# Patient Record
Sex: Female | Born: 1940 | Race: White | Hispanic: No | Marital: Married | Smoking: Former smoker
Health system: Southern US, Community
[De-identification: ages and names within clinical notes are randomized; demographics above are authoritative.]

## PROBLEM LIST (undated history)

## (undated) DIAGNOSIS — N1330 Unspecified hydronephrosis: Secondary | ICD-10-CM

## (undated) DIAGNOSIS — E785 Hyperlipidemia, unspecified: Secondary | ICD-10-CM

## (undated) DIAGNOSIS — G43909 Migraine, unspecified, not intractable, without status migrainosus: Secondary | ICD-10-CM

## (undated) DIAGNOSIS — R51 Headache: Secondary | ICD-10-CM

## (undated) DIAGNOSIS — N393 Stress incontinence (female) (male): Secondary | ICD-10-CM

## (undated) DIAGNOSIS — Z9889 Other specified postprocedural states: Secondary | ICD-10-CM

## (undated) DIAGNOSIS — R112 Nausea with vomiting, unspecified: Secondary | ICD-10-CM

## (undated) DIAGNOSIS — Z9289 Personal history of other medical treatment: Secondary | ICD-10-CM

## (undated) DIAGNOSIS — C50919 Malignant neoplasm of unspecified site of unspecified female breast: Secondary | ICD-10-CM

## (undated) DIAGNOSIS — N362 Urethral caruncle: Secondary | ICD-10-CM

## (undated) DIAGNOSIS — Z79811 Long term (current) use of aromatase inhibitors: Secondary | ICD-10-CM

## (undated) DIAGNOSIS — C801 Malignant (primary) neoplasm, unspecified: Secondary | ICD-10-CM

## (undated) DIAGNOSIS — E538 Deficiency of other specified B group vitamins: Secondary | ICD-10-CM

## (undated) DIAGNOSIS — R519 Headache, unspecified: Secondary | ICD-10-CM

## (undated) HISTORY — DX: Urethral caruncle: N36.2

## (undated) HISTORY — DX: Headache, unspecified: R51.9

## (undated) HISTORY — DX: Hyperlipidemia, unspecified: E78.5

## (undated) HISTORY — PX: BILATERAL SALPINGOOPHORECTOMY: SHX1223

## (undated) HISTORY — DX: Stress incontinence (female) (male): N39.3

## (undated) HISTORY — DX: Migraine, unspecified, not intractable, without status migrainosus: G43.909

## (undated) HISTORY — DX: Malignant (primary) neoplasm, unspecified: C80.1

## (undated) HISTORY — DX: Malignant neoplasm of unspecified site of unspecified female breast: C50.919

## (undated) HISTORY — PX: OTHER SURGICAL HISTORY: SHX169

## (undated) HISTORY — DX: Headache: R51

## (undated) HISTORY — DX: Long term (current) use of aromatase inhibitors: Z79.811

## (undated) HISTORY — DX: Other specified postprocedural states: Z98.890

---

## 1975-08-31 HISTORY — PX: TUBAL LIGATION: SHX77

## 1986-08-30 HISTORY — PX: OTHER SURGICAL HISTORY: SHX169

## 1991-08-31 HISTORY — PX: CHOLECYSTECTOMY: SHX55

## 1995-08-31 HISTORY — PX: ABDOMINAL HYSTERECTOMY: SHX81

## 2001-02-06 ENCOUNTER — Ambulatory Visit (HOSPITAL_COMMUNITY): Admission: RE | Admit: 2001-02-06 | Discharge: 2001-02-06 | Payer: Self-pay | Admitting: Pulmonary Disease

## 2002-02-16 ENCOUNTER — Ambulatory Visit (HOSPITAL_COMMUNITY): Admission: RE | Admit: 2002-02-16 | Discharge: 2002-02-16 | Payer: Self-pay | Admitting: Pulmonary Disease

## 2002-02-22 ENCOUNTER — Ambulatory Visit (HOSPITAL_COMMUNITY): Admission: RE | Admit: 2002-02-22 | Discharge: 2002-02-22 | Payer: Self-pay | Admitting: Gastroenterology

## 2002-02-22 ENCOUNTER — Encounter (INDEPENDENT_AMBULATORY_CARE_PROVIDER_SITE_OTHER): Payer: Self-pay | Admitting: Specialist

## 2004-02-20 ENCOUNTER — Ambulatory Visit (HOSPITAL_COMMUNITY): Admission: RE | Admit: 2004-02-20 | Discharge: 2004-02-20 | Payer: Self-pay | Admitting: Pulmonary Disease

## 2004-02-24 ENCOUNTER — Ambulatory Visit (HOSPITAL_COMMUNITY): Admission: RE | Admit: 2004-02-24 | Discharge: 2004-02-24 | Payer: Self-pay | Admitting: Pulmonary Disease

## 2005-02-25 ENCOUNTER — Ambulatory Visit (HOSPITAL_COMMUNITY): Admission: RE | Admit: 2005-02-25 | Discharge: 2005-02-25 | Payer: Self-pay | Admitting: Pulmonary Disease

## 2005-07-14 ENCOUNTER — Ambulatory Visit (HOSPITAL_COMMUNITY): Admission: RE | Admit: 2005-07-14 | Discharge: 2005-07-14 | Payer: Self-pay | Admitting: Pulmonary Disease

## 2005-07-26 ENCOUNTER — Ambulatory Visit: Payer: Self-pay | Admitting: Orthopedic Surgery

## 2006-09-30 ENCOUNTER — Encounter (HOSPITAL_COMMUNITY): Admission: RE | Admit: 2006-09-30 | Discharge: 2006-10-30 | Payer: Self-pay | Admitting: Specialist

## 2006-10-31 ENCOUNTER — Encounter (HOSPITAL_COMMUNITY): Admission: RE | Admit: 2006-10-31 | Discharge: 2006-11-30 | Payer: Self-pay | Admitting: Specialist

## 2006-12-01 ENCOUNTER — Ambulatory Visit: Admission: RE | Admit: 2006-12-01 | Discharge: 2006-12-01 | Payer: Self-pay | Admitting: Specialist

## 2008-03-12 ENCOUNTER — Ambulatory Visit (HOSPITAL_COMMUNITY): Admission: RE | Admit: 2008-03-12 | Discharge: 2008-03-12 | Payer: Self-pay | Admitting: Pulmonary Disease

## 2008-03-25 ENCOUNTER — Ambulatory Visit (HOSPITAL_COMMUNITY): Admission: RE | Admit: 2008-03-25 | Discharge: 2008-03-25 | Payer: Self-pay | Admitting: Pulmonary Disease

## 2009-02-28 ENCOUNTER — Ambulatory Visit (HOSPITAL_COMMUNITY): Admission: RE | Admit: 2009-02-28 | Discharge: 2009-02-28 | Payer: Self-pay | Admitting: Urology

## 2009-03-25 ENCOUNTER — Ambulatory Visit (HOSPITAL_COMMUNITY): Admission: RE | Admit: 2009-03-25 | Discharge: 2009-03-25 | Payer: Self-pay | Admitting: Urology

## 2009-04-15 ENCOUNTER — Observation Stay (HOSPITAL_COMMUNITY): Admission: RE | Admit: 2009-04-15 | Discharge: 2009-04-16 | Payer: Self-pay | Admitting: Urology

## 2009-04-15 ENCOUNTER — Encounter (INDEPENDENT_AMBULATORY_CARE_PROVIDER_SITE_OTHER): Payer: Self-pay | Admitting: Urology

## 2009-08-30 HISTORY — PX: BLADDER SURGERY: SHX569

## 2009-10-16 ENCOUNTER — Ambulatory Visit (HOSPITAL_COMMUNITY): Admission: RE | Admit: 2009-10-16 | Discharge: 2009-10-16 | Payer: Self-pay | Admitting: Urology

## 2010-11-18 LAB — CBC
Hemoglobin: 13.4 g/dL (ref 12.0–15.0)
RBC: 4.19 MIL/uL (ref 3.87–5.11)
RDW: 13.3 % (ref 11.5–15.5)

## 2010-11-18 LAB — BASIC METABOLIC PANEL
Calcium: 9.7 mg/dL (ref 8.4–10.5)
GFR calc Af Amer: >60 mL/min (ref >60)
GFR calc non Af Amer: >60 mL/min (ref >60)
Sodium: 139 mEq/L (ref 135–145)

## 2010-12-05 LAB — CBC
HCT: 33.5 % — ABNORMAL LOW (ref 36.0–46.0)
HCT: 38.9 % (ref 36.0–46.0)
Hemoglobin: 11.6 g/dL — ABNORMAL LOW (ref 12.0–15.0)
MCHC: 34.7 g/dL (ref 30.0–36.0)
MCV: 93.9 fL (ref 78.0–100.0)
Platelets: 287 10*3/uL (ref 150–400)
RBC: 3.53 MIL/uL — ABNORMAL LOW (ref 3.87–5.11)
RDW: 12.9 % (ref 11.5–15.5)
RDW: 13.2 % (ref 11.5–15.5)

## 2010-12-05 LAB — DIFFERENTIAL
Basophils Absolute: 0 10*3/uL (ref 0.0–0.1)
Eosinophils Relative: 0 % (ref 0–5)
Lymphocytes Relative: 21 % (ref 12–46)
Lymphs Abs: 2.5 10*3/uL (ref 0.7–4.0)
Monocytes Absolute: 1 10*3/uL (ref 0.1–1.0)
Monocytes Relative: 9 % (ref 3–12)

## 2010-12-05 LAB — BASIC METABOLIC PANEL
BUN: 10 mg/dL (ref 6–23)
CO2: 27 mEq/L (ref 19–32)
CO2: 27 mEq/L (ref 19–32)
Chloride: 103 mEq/L (ref 96–112)
Creatinine, Ser: 0.91 mg/dL (ref 0.4–1.2)
GFR calc Af Amer: >60 mL/min (ref >60)
GFR calc non Af Amer: >60 mL/min (ref >60)
Glucose, Bld: 106 mg/dL — ABNORMAL HIGH (ref 70–99)
Glucose, Bld: 117 mg/dL — ABNORMAL HIGH (ref 70–99)
Potassium: 4.1 mEq/L (ref 3.5–5.1)
Sodium: 140 mEq/L (ref 135–145)

## 2011-01-12 NOTE — Op Note (Signed)
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   ANESTHESIA:  General endotracheal.   PROCEDURE:  Cystoscopy, retrograde urethrogram, urethral  diverticulectomy, removal of the stone, and repair of the urethrovaginal  fistula.   SURGEON:  Ky Barban, MD   PROCEDURE:  The patient under general endotracheal anesthesia in  lithotomy position.  After usual prep and drape, #18 Foley catheter  inserted into the bladder.  Then, Hypaque solution was injected  alongside this catheter into the urethra when I was pulling the balloon  against the bladder neck trying to fill the urethra up.  It was done  under fluoroscopic control.  I can see the dye goes around the stone, so  at this point I decided to do a cystoscopy.  A #25 cystoscope introduced  into the bladder and the urethra was inspected.  I can see the opening  of the urethral diverticulum tiny opening which is located around 5  o'clock position just slightly distal to the bladder neck, and while I  was looking through the cystoscope and I have #5 whistle-tip catheter  into the diverticulum and I injected the methylene blue solution into  it, inspected the vaginal mucosa, then I can see a tiny opening where  the stone is and this opening.  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  The incision was made in the  vaginal mucosa all layers.  I was able to develop a plane between the  vaginal mucosa and the urethral diverticulum when the stone was still in  place but I cannot get around it.  So I decided to open up the  diverticulum.  Diverticulum was opened, and the stone was taken out.  Then from this site, I tried to locate the fistulous opening between the  urethra and the bladder which I was able to locate, and I put the  whistle-tip ureteral catheter through it.  The diverticulum  was  dissected out of the periurethral tissue and the perivaginal area.  Then, I was able to put a small #8 Foley catheter through the fistulous  opening into the bladder.  Balloon was inflated.  The ureteral catheter  was removed, and with traction on the Foley balloon I can see the  opening of the diverticulum very clearly.  The diverticulum was excised  around this opening and separated from rest of the perivaginal and  periurethral tissue and the diverticular sac was thus removed  completely.  Then, under direct vision the fistulous opening was closed  with interrupted sutures of 3-0 Vicryl.  Then, I closed the remaining  periurethral and perivaginal tissue on top of this in at least 3 layers  covering this fistulous opening.  The Foley catheter which was placed  through this fistulous opening I removed it and tied the stitches.  I  injected methylene blue solution into the urethra to see  if there was  any leakage from the fistulous tract.  I do not see any methylene blue  coming out into the fistulous track area.  After closing the  periurethral tissue at least in 3 layers, the vaginal mucosa was closed  with 3-0 Vicryl interrupted sutures.  Again, a #20 Foley catheter was  left in for drainage, and vagina was packed with Iodoform gauze.  The  patient left the operating room in satisfactory condition.  I did  inspect the urethra with the cystoscope at the end.  Both orifices were  draining clear urine.  The urethra also looks patent.  Of course, there  was some irregular tissue in the proximal urethra from the repair.  All  the instruments were removed.  The patient left the operating room in  satisfactory condition.      Ky Barban, M.D.  Electronically Signed     MIJ/MEDQ  D:  04/15/2009  T:  04/16/2009  Job:  [id number]

## 2011-01-12 NOTE — Consult Note (Signed)
NAME:  Shelly Willis, Shelly Willis           ACCOUNT NO.:  [id number]   MEDICAL RECORD NO.:  [id number]           PATIENT TYPE:  AMB   LOCATION:  DAY                           FACILITY:  [facility]   PHYSICIAN:  Ky Barban, M.D.DATE OF BIRTH:  12-14-1940   DATE OF CONSULTATION:  DATE OF DISCHARGE:                                 CONSULTATION   CHIEF COMPLAINT:  Urethral diverticulum with a large 3 cm stone.   HISTORY:  A 70 year old female first she was seen by Dr. Rito Ehrlich July  8, who diagnosed her clinically with CT scan and physical exam that she  has a urethral diverticulum with a large 3 cm stone, urinary tract  infections, and incontinence mixed type, so she was referred to me for  further management.  I got MRI of the pelvis because there was some  question on this CT, this was a urethral diverticulum or some other  pathology, but the radiologist on MRI thinks it was a urethral  diverticulum with the stone.  I cystoscoped that in the office.  I could  not see any opening of the diverticulum in the urethra.  It was very  uncomfortable, so I quit the procedure, so I have suggested that we do a  retrograde urethrogram cystoscopy, but it is a urethral diverticulum.  I  need to go ahead and remove the stone and repair the urethral  diverticulum.  I excised the diverticulum and closed the opening between  the urethra and the diverticulum.  I went over the procedure several  time with the patient and her husband.  I told them that there is always  possibility of developing a leak from this area.  The patient can  develop urethrovaginal fistula which mean that the urine can  continuously come from that whole and she will require additional  surgery.  The second thing is as per the incontinence is concerned once  we remove the stone, I will have to reassess it.  I do not think, I want  to do any anti-incontinence procedure at this time combined it with this  procedure and they understand, want  me to go ahead and proceed, but she  is coming as outpatient, so we can go ahead and do a urethral  diverticulum with urethral diverticulectomy, retrograde urethrogram, and  cystoscopy, and removal of the stone under anesthesia, keep her there  overnight for observation.   PAST MEDICAL HISTORY:  Essentially negative.  No history of diabetes or  hypertension.   PERSONAL HISTORY:  Does not smoke or drink.   REVIEW OF SYSTEMS:  Unremarkable.   PHYSICAL EXAMINATION:  GENERAL:  Well-nourished, well-developed female,  not in acute distress.  VITAL SIGNS:  Blood pressure 120/80, temperature is normal.  CENTRAL NERVOUS SYSTEM:  Negative.  HEAD, NECK, EYES, AND ENT:  Negative.  CHEST:  Symmetrical.  HEART:  Regular sinus rhythm and no murmur.  ABDOMEN:  Soft, flat.  Liver, spleen, and kidneys are not palpable.  No  CVA tenderness.  PELVIC:  There is a walnut size, solid, hard stone palpated in the  suburethral area in the anterior  vaginal wall near the bladder neck.   IMPRESSION:  1. Urethral diverticulum with stone.  2. Urinary incontinence, mixed type.   PLAN:  Urethral diverticulectomy, removal of stone, retrograde  urethrogram, cystoscopy under anesthesia.   NOTE:  The patient had a urine culture done on July 28.  She is being  treated with Cipro and she is growing E. coli in the urine, so what she  may need prophylactic antibiotic, it is sensitive to most of the  antibiotics.      Ky Barban, M.D.  Electronically Signed     MIJ/MEDQ  D:  04/14/2009  T:  04/15/2009  Job:  [id number]

## 2011-04-13 ENCOUNTER — Other Ambulatory Visit (HOSPITAL_COMMUNITY): Payer: Self-pay | Admitting: Pulmonary Disease

## 2011-04-13 DIAGNOSIS — Z139 Encounter for screening, unspecified: Secondary | ICD-10-CM

## 2011-04-19 ENCOUNTER — Ambulatory Visit (HOSPITAL_COMMUNITY)
Admission: RE | Admit: 2011-04-19 | Discharge: 2011-04-19 | Disposition: A | Discharge status: Home / Self Care | Payer: Medicare Other | Source: Ambulatory Visit | Attending: Pulmonary Disease | Admitting: Pulmonary Disease

## 2011-04-19 DIAGNOSIS — Z1231 Encounter for screening mammogram for malignant neoplasm of breast: Secondary | ICD-10-CM | POA: Insufficient documentation

## 2011-04-19 DIAGNOSIS — Z139 Encounter for screening, unspecified: Secondary | ICD-10-CM

## 2011-04-22 ENCOUNTER — Other Ambulatory Visit: Payer: Self-pay | Admitting: Pulmonary Disease

## 2011-04-22 DIAGNOSIS — R928 Other abnormal and inconclusive findings on diagnostic imaging of breast: Secondary | ICD-10-CM

## 2011-05-26 ENCOUNTER — Ambulatory Visit (HOSPITAL_COMMUNITY): Payer: Medicare Other

## 2011-06-02 ENCOUNTER — Ambulatory Visit (HOSPITAL_COMMUNITY)
Admission: RE | Admit: 2011-06-02 | Discharge: 2011-06-02 | Disposition: A | Discharge status: Home / Self Care | Payer: Medicare Other | Source: Ambulatory Visit | Attending: Pulmonary Disease | Admitting: Pulmonary Disease

## 2011-06-02 ENCOUNTER — Other Ambulatory Visit (HOSPITAL_COMMUNITY): Payer: Self-pay | Admitting: Pulmonary Disease

## 2011-06-02 DIAGNOSIS — R928 Other abnormal and inconclusive findings on diagnostic imaging of breast: Secondary | ICD-10-CM

## 2011-06-02 DIAGNOSIS — N6310 Unspecified lump in the right breast, unspecified quadrant: Secondary | ICD-10-CM

## 2011-06-02 DIAGNOSIS — N63 Unspecified lump in unspecified breast: Secondary | ICD-10-CM | POA: Insufficient documentation

## 2011-06-09 ENCOUNTER — Inpatient Hospital Stay (HOSPITAL_COMMUNITY): Admission: RE | Admit: 2011-06-09 | Payer: Medicare Other | Source: Ambulatory Visit

## 2011-06-09 ENCOUNTER — Ambulatory Visit (HOSPITAL_COMMUNITY): Admission: RE | Admit: 2011-06-09 | Payer: Medicare Other | Source: Ambulatory Visit

## 2011-06-16 ENCOUNTER — Other Ambulatory Visit (HOSPITAL_COMMUNITY): Payer: Self-pay | Admitting: Pulmonary Disease

## 2011-06-16 ENCOUNTER — Ambulatory Visit (HOSPITAL_COMMUNITY)
Admission: RE | Admit: 2011-06-16 | Discharge: 2011-06-16 | Disposition: A | Discharge status: Home / Self Care | Payer: Medicare Other | Source: Ambulatory Visit | Attending: Pulmonary Disease | Admitting: Pulmonary Disease

## 2011-06-16 ENCOUNTER — Inpatient Hospital Stay (HOSPITAL_COMMUNITY): Admission: RE | Admit: 2011-06-16 | Discharge: 2011-06-16 | Payer: Medicare Other | Source: Ambulatory Visit

## 2011-06-16 DIAGNOSIS — C50919 Malignant neoplasm of unspecified site of unspecified female breast: Secondary | ICD-10-CM

## 2011-06-16 DIAGNOSIS — N6310 Unspecified lump in the right breast, unspecified quadrant: Secondary | ICD-10-CM

## 2011-06-16 HISTORY — PX: OTHER SURGICAL HISTORY: SHX169

## 2011-06-16 HISTORY — DX: Malignant neoplasm of unspecified site of unspecified female breast: C50.919

## 2011-06-16 NOTE — Progress Notes (Signed)
9604 Procedure started for Right Breast Biopsy.  Area cleaned with Chlora prep and injected with 6 cc of Lidocaine 2%.  Three passes obtained from right breast.  Pt tolerated procedure well.  Procedure end time 1000.

## 2011-06-17 ENCOUNTER — Other Ambulatory Visit: Payer: Self-pay | Admitting: Pulmonary Disease

## 2011-06-17 DIAGNOSIS — C50911 Malignant neoplasm of unspecified site of right female breast: Secondary | ICD-10-CM

## 2011-06-21 ENCOUNTER — Ambulatory Visit (HOSPITAL_COMMUNITY)
Admission: RE | Admit: 2011-06-21 | Discharge: 2011-06-21 | Disposition: A | Discharge status: Home / Self Care | Payer: Medicare Other | Source: Ambulatory Visit | Attending: Pulmonary Disease | Admitting: Pulmonary Disease

## 2011-06-21 DIAGNOSIS — C50919 Malignant neoplasm of unspecified site of unspecified female breast: Secondary | ICD-10-CM | POA: Insufficient documentation

## 2011-06-21 DIAGNOSIS — C50911 Malignant neoplasm of unspecified site of right female breast: Secondary | ICD-10-CM

## 2011-06-21 MED ORDER — GADOBENATE DIMEGLUMINE 529 MG/ML IV SOLN
12.0000 mL | Freq: Once | INTRAVENOUS | Status: AC | PRN
  Administered 2011-06-21: 12 mL via INTRAVENOUS

## 2011-06-23 ENCOUNTER — Encounter: Payer: Medicare Other | Admitting: Oncology

## 2011-06-23 ENCOUNTER — Ambulatory Visit: Payer: Medicare Other | Admitting: Physical Therapy

## 2011-06-24 ENCOUNTER — Other Ambulatory Visit (INDEPENDENT_AMBULATORY_CARE_PROVIDER_SITE_OTHER): Payer: Self-pay | Admitting: General Surgery

## 2011-06-24 ENCOUNTER — Encounter (INDEPENDENT_AMBULATORY_CARE_PROVIDER_SITE_OTHER): Payer: Self-pay | Admitting: Surgery

## 2011-06-24 ENCOUNTER — Ambulatory Visit (INDEPENDENT_AMBULATORY_CARE_PROVIDER_SITE_OTHER): Payer: Medicare Other | Admitting: Surgery

## 2011-06-24 VITALS — BP 158/86 | HR 60 | Temp 96.8°F | Resp 20 | Ht 64.0 in | Wt 143.5 lb

## 2011-06-24 DIAGNOSIS — C50919 Malignant neoplasm of unspecified site of unspecified female breast: Secondary | ICD-10-CM

## 2011-06-24 DIAGNOSIS — C50911 Malignant neoplasm of unspecified site of right female breast: Secondary | ICD-10-CM

## 2011-06-24 NOTE — Progress Notes (Signed)
Shelly Willis is a 70 year old lady from [place] who is a close friend of my breast cancer patient, Shelly Willis who came to see me regarding her new diagnosis of invasive breast cancer with lobular features.  This is HER2 negative, ER + at 99%, PR+ 23%, Ki67 32%.  On MRI this is upper right breast and is 2.4 cm in greatest dimension.  The MR report mentions dermal thickening and nipple retraction but she has a moderate hematoma present and I think that this is post biopsy tissue edema.    Physical exam reveals a biopsy site on the right about 9:00 with surrounding  Ecchymosis. There is a subtle palpable mass deep in the breast beneath the nipple. There is edema in the area of the bruise I think that this is what we are seeing on the MR scan. There is no nipple discharge on either side. I feel no axillary or supraclavicular adenopathy on either side.  She reported that prior to her mammogram, she had no mass or breast changes that she had noticed.    Based on the size of her breast I think that lumpectomy with adequate margins would destroy the architecture. I think she may be a candidate for neoadjuvant chemotherapy and we were for her to medical oncology to consider this. I explained this to her daughter, son and husband as well as to her.  Plan referred to medical oncology for consideration of neoadjuvant chemotherapy.

## 2011-07-07 ENCOUNTER — Encounter: Payer: Self-pay | Admitting: Oncology

## 2011-07-07 ENCOUNTER — Other Ambulatory Visit: Payer: Self-pay | Admitting: Oncology

## 2011-07-07 ENCOUNTER — Ambulatory Visit
Admission: RE | Admit: 2011-07-07 | Discharge: 2011-07-07 | Disposition: A | Discharge status: Home / Self Care | Payer: Medicare Other | Source: Ambulatory Visit | Attending: Radiation Oncology | Admitting: Radiation Oncology

## 2011-07-07 ENCOUNTER — Encounter: Payer: Self-pay | Admitting: Radiation Oncology

## 2011-07-07 ENCOUNTER — Other Ambulatory Visit: Payer: Medicare Other | Admitting: Lab

## 2011-07-07 ENCOUNTER — Ambulatory Visit (HOSPITAL_BASED_OUTPATIENT_CLINIC_OR_DEPARTMENT_OTHER): Payer: Medicare Other

## 2011-07-07 ENCOUNTER — Ambulatory Visit (HOSPITAL_BASED_OUTPATIENT_CLINIC_OR_DEPARTMENT_OTHER): Payer: Medicare Other | Admitting: Oncology

## 2011-07-07 VITALS — BP 176/85 | HR 68 | Temp 97.7°F | Ht 64.5 in | Wt 142.4 lb

## 2011-07-07 DIAGNOSIS — R232 Flushing: Secondary | ICD-10-CM | POA: Insufficient documentation

## 2011-07-07 DIAGNOSIS — C50919 Malignant neoplasm of unspecified site of unspecified female breast: Secondary | ICD-10-CM

## 2011-07-07 DIAGNOSIS — Z90710 Acquired absence of both cervix and uterus: Secondary | ICD-10-CM | POA: Insufficient documentation

## 2011-07-07 DIAGNOSIS — Z170 Estrogen receptor positive status [ER+]: Secondary | ICD-10-CM

## 2011-07-07 DIAGNOSIS — C50419 Malignant neoplasm of upper-outer quadrant of unspecified female breast: Secondary | ICD-10-CM

## 2011-07-07 DIAGNOSIS — N393 Stress incontinence (female) (male): Secondary | ICD-10-CM | POA: Insufficient documentation

## 2011-07-07 DIAGNOSIS — E785 Hyperlipidemia, unspecified: Secondary | ICD-10-CM | POA: Insufficient documentation

## 2011-07-07 LAB — COMPREHENSIVE METABOLIC PANEL
ALT: 16 U/L (ref 0–35)
AST: 21 U/L (ref 0–37)
Albumin: 4.5 g/dL (ref 3.5–5.2)
CO2: 28 mEq/L (ref 19–32)
Calcium: 10.5 mg/dL (ref 8.4–10.5)
Chloride: 100 mEq/L (ref 96–112)
Creatinine, Ser: 0.88 mg/dL (ref 0.50–1.10)
Potassium: 3.7 mEq/L (ref 3.5–5.3)
Sodium: 138 mEq/L (ref 135–145)
Total Protein: 8.4 g/dL — ABNORMAL HIGH (ref 6.0–8.3)

## 2011-07-07 LAB — CBC WITH DIFFERENTIAL/PLATELET
BASO%: 0.4 % (ref 0.0–2.0)
EOS%: 1.5 % (ref 0.0–7.0)
HCT: 43.4 % (ref 34.8–46.6)
HGB: 14.7 g/dL (ref 11.6–15.9)
MCHC: 33.9 g/dL (ref 31.5–36.0)
MONO#: 0.5 10*3/uL (ref 0.1–0.9)
NEUT%: 54.3 % (ref 38.4–76.8)
RDW: 13.2 % (ref 11.2–14.5)
WBC: 8.4 10*3/uL (ref 3.9–10.3)
lymph#: 3.2 10*3/uL (ref 0.9–3.3)

## 2011-07-07 LAB — CANCER ANTIGEN 27.29: CA 27.29: 115 U/mL — ABNORMAL HIGH (ref 0–39)

## 2011-07-07 NOTE — Progress Notes (Signed)
Quick Note:  Pt has elevated alk phos and CA 27-25--I have called her and told her we will be doing some scans--she will call for results and also is she has not heard from our schedulers by Tuesday ______

## 2011-07-07 NOTE — Progress Notes (Signed)
Please see the Nurse Progress Note in the MD Initial Consult Encounter for this patient. 

## 2011-07-07 NOTE — Progress Notes (Signed)
Shelly Willis is an 70 y.o. female.    Chief Complaint  Patient presents with  . Breast Cancer    HPI: Shelly Willis is a 70 year old refill woman referred by Dr. Daphine Deutscher for evaluation and treatment in the setting of newly diagnosed breast cancer.  The patient had routine screening mammography 04/21/2011. This showed a possible distortion in the right breast. The patient was brought back for additional views October 3 which confirmed the area of distortion. Ultrasound showed a 1.8 cm suspicious area and this was biopsied October 17, showing [facility] [id number]) an invasive mammary carcinoma with lobular features. This was strongly estrogen and progesterone receptor positive, HER-2 negative, with an elevated MIB-1. The patient was then referred to Dr. Wenda Low and bilateral breast MRIs were obtained October 22 confirming a 2.4 cm mass in the upper right breast at the 12:00 position there were some right nipple retraction and skin thickening but this is felt to be secondary to post biopsy change there were no other suspicious areas of concern. Specifically there was no evidence of axillary or internal mammary adenopathy.   Past Medical History  Diagnosis Date  . Cancer     breast  . Nasal congestion   . Sore throat   . Cataract   . Hyperlipidemia     Past Surgical History  Procedure Date  . Cholecystectomy 1993  . Tubal ligation 1977  . Back surgery 1987    ruptured disc  . Abdominal hysterectomy 1997  . Bladder surgery 2011    growth attached to bladder stem     Family history: The patient's father died in an automobile accident at the age of 66. The patient's mother died in her mid-80s status post stroke the patient has one brother and one sister in fair health there is no history of breast or ovarian cancer in the family  Gynecologic history: Menarche age 45 hysterectomy age 36 the patient took hormone replacement approximately 20 years. She is GX T1 first pregnancy to  term age 78  Social History:  reports that she has quit smoking. She has never used smokeless tobacco. She reports that she drinks alcohol. She reports that she does not use illicit drugs.  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 Vesicare (Solifenacin Succinate)     Headaches and upset stomach     Medications Prior to Admission  Medication Sig Dispense Refill  . estrogens, conjugated, (PREMARIN) 1.25 MG tablet Take 1.25 mg by mouth daily.         No current facility-administered medications on file as of 07/07/2011.    ROS the patient has mild bladder stress incontinence and has been evaluated for this by urology she is having hot flashes now that she is going off her Premarin. She exercises regularly begin 3 times a week and walks the other days he detailed review of systems was  otherwise entirely negative  Physical Exam:  Blood pressure 176/85, pulse 68, temperature 97.7 F (36.5 C), temperature source Oral, height 5' 4.5" (1.638 m), weight 142 lb 6.4 oz (64.592 kg).  Sclerae unicteric Oropharynx clear No peripheral adenopathy Lungs no rales or rhonchi Heart regular rate and rhythm Abd benign MSK no focal spinal tenderness, no peripheral edema Neuro: nonfocal Breasts: Right breast I do not palpate a definite mass I would not be able to tell the exact size of this mass  by palpation there is minimal ecchymosis from the prior biopsy there is no significant skin change or nipple retraction left breast a suspicious finding  CBC Lab Results  Component Value Date   WBC 7.7 10/13/2009   HGB 14.7 07/07/2011   HCT 43.4 07/07/2011   MCV 94.2 07/07/2011   PLT 328 07/07/2011   CMP     Component Value Date/Time   NA 138 07/07/2011 1055   K 3.7 07/07/2011 1055   CL 100 07/07/2011 1055   CO2 28 07/07/2011 1055   GLUCOSE 83 07/07/2011 1055   BUN 10 07/07/2011 1055   CREATININE 0.88 07/07/2011 1055   CALCIUM 10.5 07/07/2011 1055   PROT 8.4* 07/07/2011 1055   ALBUMIN 4.5 07/07/2011 1055   AST 21 07/07/2011 1055   ALT 16 07/07/2011 1055   ALKPHOS 120* 07/07/2011 1055   BILITOT 0.2* 07/07/2011 1055   GFRNONAA 67* 06/21/2011 0810   GFRAA 78* 06/21/2011 0810     Films: As previously discussed   Assessment: 70 year old refill woman status post right breast biopsy October 2012 41 by MRI is a 2.4 cm solitary mass, pathologically an invasive lobular carcinoma (as discussed in the multidisciplinary breast conference 06/30/2011), estrogen receptor positive at 99% progesterone receptor positive at 23%, with an MIB-1 of 32% and no HER-2 amplification. MRI suggests no axillary or internal mammary adenopathy.  Plan: We spent the better part of her hour-long visit today orienting her tumor pathology and its meaning. She understands she does have an invasive breast cancer that seems to be lobular to the best of our ability to Taxol, and lobular breast cancers generally do not respond as well at the chemotherapy. Indeed the adjuvant! Program with quarter a chemotherapy benefit in the 2% range.  Accordingly I think we should do a neoadjuvant hormone therapy. Dr. Daphine Deutscher feels neoadjuvant therapy would make the surgery easier. This certainly should result in better cosmesis postop period the patient understands this well and is eager to get started  We then discussed the possible toxicities side  effects and complications of anti-estrogens. We are going to start with letrozole and I went ahead and wrote her the prescription today. She understands she needs to go off estrogen replacement and that she will have worsening postmenopausal symptoms in the near future. She will call for any problems and will return here in 3 months with a repeat MRI prior to that visit as well as lab work anticipated 6-9 month period of pre-surgical treatment with anti-estrogens prior to proceeding to definitive breast conserving surgery.  When and if she does however surgery I will request an Oncotype DX to help [place] in the decision whether she will need adjuvant chemotherapy given that she does have a T2 tumor.  MAGRINAT,GUSTAV C 07/07/2011, 12:40 PM

## 2011-07-07 NOTE — Progress Notes (Signed)
Right breast biopsy 06/16/11. INVASIVE MAMMARY CA W/LOBULAR FEATURES  STARTED MENSTRUAL CYCLE AGE 70  TOOK HRT FOR 25 YEARS

## 2011-07-08 ENCOUNTER — Telehealth: Payer: Self-pay | Admitting: Oncology

## 2011-07-08 DIAGNOSIS — C50419 Malignant neoplasm of upper-outer quadrant of unspecified female breast: Secondary | ICD-10-CM

## 2011-07-08 NOTE — Progress Notes (Signed)
CC:   [place]. Daphine Deutscher, MD Lowella Dell, M.D.   DIAGNOSIS:  T2 N0 invasive lobular carcinoma of the right breast.  PREVIOUS INTERVENTIONS:  Biopsy of right breast mass on 06/16/2011 revealing invasive mammary carcinoma with lobular features, ER/PR positive HER-2 negative.  HISTORY OF PRESENT ILLNESS:  Shelly Willis is a pleasant 70 year old female who missed 2 years of mammograms and presented with a screening mammogram in early October.  This showed a mass in the 12 o'clock region of the breast.  An ultrasound confirmed the presence of a 1.8 x 1.5 cm mass.  The biopsy confirmed the presence of an invasive mammary carcinoma with lobular features.  An MRI of the bilateral breasts was performed on 06/21/2011.  This showed a 1.6 x 2.4 x 2.4 cm area in the upper right breast with nipple retraction, and areolar skin thickening. She was referred to Dr. Daphine Deutscher, who discussed neoadjuvant treatment followed by lumpectomy.  She met with Dr. Darnelle Catalan, who gave her a prescription for an antiestrogen and she has agreed to start on that. She had also been on hormone replacement therapy for about 25 years and Dr. Darnelle Catalan asked her to stopped as well.  PAST MEDICAL HISTORY: 1. Breast cancer. 2. Cataract surgery. 3. Hyperlipidemia. 4. Status post cholecystectomy. 5. Status post tubal ligation. 6. Status post back surgery. 7. Status post abdominal hysterectomy. 8. Status post bladder surgery.  FAMILY HISTORY:  There is no family history of breast or ovarian cancer.  GYN HISTORY:  Menarche at 62.  Hysterectomy at the age of 60.  She is GX, P1.  SOCIAL HISTORY:  She did smoke, but has quit recently.  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  She is accompanied by her husband and daughter-in-law.  ALLERGIES:  She is allergic to VESIcare.  MEDICATIONS:  Premarin.  REVIEW OF SYSTEMS:  Per the history of present illness.  She also reports mild bladder stress  incontinence as well as hot flashes.  All other systems were reviewed and found to be negative.  PHYSICAL EXAMINATION:  General Appearance:  She is a pleasant female in no distress sitting comfortably on the examining room table.  Vital Signs:  Blood pressure 167/87.  Temperature 97.8.  Pulse 90.  Weight 145 pounds.  Neck:  She has no palpable cervical or supraclavicular adenopathy.  Breast Exam:  She has nipple retraction evident in the right breast.  The left breast appears normal.  She has no axillary adenopathy.  She has a palpable right breast mass in the upper outer quadrant of the breast extending from the nipple towards the upper outer quadrant centrally.  This is nontender.  No abnormalities in the left breast.  Heart:  Regular rate and rhythm.  Lungs:  Clear to auscultation bilaterally.  IMPRESSION:  T2 N0 invasive lobular carcinoma.  RECOMMENDATIONS:  [name] and her family and I discussed her diagnosis and options for treatment.  We discussed the possibility of breast conservation and the equivalency in terms of survival between breast conservation and mastectomy.  We discussed that breast conservation at this time would leave her with a significant cosmetic defect.  She does wish to preserve her breast.  We discussed the role of radiation in decreasing local failures.  We discussed that even if she has a pathologic complete response to antiestrogen therapy that we would still recommend radiation.  We discussed 6 weeks of treatment as an outpatient.  We discussed the process of simulation and the  placement of tattoos.  She lives in [place], but wishes to receive her treatment here.  We discussed lung damage.  I will meet back with her after she has completed her surgery.  I told her we would try to start her treatment in 4-6 weeks after surgery.  Dr. Darnelle Catalan has also ordered a CT scan and a bone scan.    ______________________________ Lurline Hare,  M.D. SW/MEDQ  D:  07/08/2011  T:  07/08/2011  Job:  [id number]

## 2011-07-08 NOTE — Telephone Encounter (Signed)
s/w pt and provided appt for ct and bone scan for 07/14/2011 @ 8:30am @ [facility].

## 2011-07-09 NOTE — Progress Notes (Signed)
Encounter addended by: Tessa Lerner, RN on: 07/09/2011  5:00 PM<BR>     Documentation filed: Charges VN

## 2011-07-11 ENCOUNTER — Encounter: Payer: Self-pay | Admitting: *Deleted

## 2011-07-11 NOTE — Progress Notes (Signed)
Mailed after appt letter to pt. 

## 2011-07-12 NOTE — Progress Notes (Signed)
Encounter addended by: Tessa Lerner, RN on: 07/12/2011 10:18 AM<BR>     Documentation filed: Charges VN

## 2011-07-14 ENCOUNTER — Encounter (HOSPITAL_COMMUNITY)
Admission: RE | Admit: 2011-07-14 | Discharge: 2011-07-14 | Disposition: A | Discharge status: Home / Self Care | Payer: Medicare Other | Source: Ambulatory Visit | Attending: Oncology | Admitting: Oncology

## 2011-07-14 ENCOUNTER — Other Ambulatory Visit (HOSPITAL_COMMUNITY): Payer: Medicare Other

## 2011-07-14 ENCOUNTER — Encounter (HOSPITAL_COMMUNITY): Payer: Self-pay

## 2011-07-14 DIAGNOSIS — R599 Enlarged lymph nodes, unspecified: Secondary | ICD-10-CM | POA: Insufficient documentation

## 2011-07-14 DIAGNOSIS — C50919 Malignant neoplasm of unspecified site of unspecified female breast: Secondary | ICD-10-CM | POA: Insufficient documentation

## 2011-07-14 DIAGNOSIS — J984 Other disorders of lung: Secondary | ICD-10-CM | POA: Insufficient documentation

## 2011-07-14 DIAGNOSIS — R222 Localized swelling, mass and lump, trunk: Secondary | ICD-10-CM | POA: Insufficient documentation

## 2011-07-14 MED ORDER — TECHNETIUM TC 99M MEDRONATE IV KIT
23.4000 | PACK | Freq: Once | INTRAVENOUS | Status: AC | PRN
  Administered 2011-07-14: 23.4 via INTRAVENOUS

## 2011-07-14 MED ORDER — IOHEXOL 300 MG/ML  SOLN
80.0000 mL | Freq: Once | INTRAMUSCULAR | Status: AC | PRN
  Administered 2011-07-14: 80 mL via INTRAVENOUS

## 2011-07-15 ENCOUNTER — Ambulatory Visit (HOSPITAL_BASED_OUTPATIENT_CLINIC_OR_DEPARTMENT_OTHER): Payer: Medicare Other

## 2011-07-15 ENCOUNTER — Encounter: Payer: Self-pay | Admitting: *Deleted

## 2011-07-15 DIAGNOSIS — C50419 Malignant neoplasm of upper-outer quadrant of unspecified female breast: Secondary | ICD-10-CM

## 2011-07-15 DIAGNOSIS — Z23 Encounter for immunization: Secondary | ICD-10-CM

## 2011-07-15 NOTE — Progress Notes (Signed)
Mailed after appt letter to pt. 

## 2011-07-16 ENCOUNTER — Telehealth: Payer: Self-pay | Admitting: Oncology

## 2011-07-16 ENCOUNTER — Encounter: Payer: Self-pay | Admitting: Oncology

## 2011-07-16 ENCOUNTER — Other Ambulatory Visit: Payer: Self-pay | Admitting: Oncology

## 2011-07-16 DIAGNOSIS — C50919 Malignant neoplasm of unspecified site of unspecified female breast: Secondary | ICD-10-CM

## 2011-07-16 NOTE — Telephone Encounter (Signed)
Called Ms. Shelly Willis w results of her CT scan and bone scan. There is more locoregional spread than we were aware of initially, but moire importantly the bone scan suggests several spots suspicious of metastatic disease. We had a preliminary discussion re this. I have set her up for MRI of the thoraco-lumbar spine, and she will see me 1-2 days afterwards to discuss in more detail. I have also alerted dr Daphine Deutscher re the above. For now I think continuing anti-estrogens is adequate as we don't see evidence of lung or liver involvement.

## 2011-07-22 ENCOUNTER — Other Ambulatory Visit: Payer: Self-pay | Admitting: Oncology

## 2011-07-22 DIAGNOSIS — C50919 Malignant neoplasm of unspecified site of unspecified female breast: Secondary | ICD-10-CM

## 2011-07-23 ENCOUNTER — Telehealth: Payer: Self-pay | Admitting: *Deleted

## 2011-07-23 NOTE — Telephone Encounter (Signed)
Called patient informed patient of the appointments for mri of the lumbar spine and the thoraic spine on 07-30-2011 informed the patient that she would need to get labs done before the procedure at 10:15am on 07-30-2011.

## 2011-07-29 NOTE — Progress Notes (Signed)
Encounter addended by: Delynn Flavin, RN on: 07/29/2011 10:33 AM<BR>     Documentation filed: Charges VN

## 2011-07-30 ENCOUNTER — Other Ambulatory Visit: Payer: Self-pay | Admitting: Oncology

## 2011-07-30 ENCOUNTER — Ambulatory Visit (HOSPITAL_COMMUNITY)
Admission: RE | Admit: 2011-07-30 | Discharge: 2011-07-30 | Disposition: A | Discharge status: Home / Self Care | Payer: Medicare Other | Source: Ambulatory Visit | Attending: Oncology | Admitting: Oncology

## 2011-07-30 ENCOUNTER — Other Ambulatory Visit (HOSPITAL_BASED_OUTPATIENT_CLINIC_OR_DEPARTMENT_OTHER): Payer: Medicare Other | Admitting: Lab

## 2011-07-30 DIAGNOSIS — C50919 Malignant neoplasm of unspecified site of unspecified female breast: Secondary | ICD-10-CM

## 2011-07-30 DIAGNOSIS — M47817 Spondylosis without myelopathy or radiculopathy, lumbosacral region: Secondary | ICD-10-CM | POA: Insufficient documentation

## 2011-07-30 LAB — CBC WITH DIFFERENTIAL/PLATELET
BASO%: 0.7 % (ref 0.0–2.0)
Basophils Absolute: 0.1 10*3/uL (ref 0.0–0.1)
EOS%: 1.4 % (ref 0.0–7.0)
MCH: 31.3 pg (ref 25.1–34.0)
MCHC: 33.5 g/dL (ref 31.5–36.0)
MCV: 93.7 fL (ref 79.5–101.0)
MONO%: 7 % (ref 0.0–14.0)
RBC: 4.37 10*6/uL (ref 3.70–5.45)
RDW: 13.4 % (ref 11.2–14.5)
lymph#: 3 10*3/uL (ref 0.9–3.3)

## 2011-07-30 LAB — COMPREHENSIVE METABOLIC PANEL
ALT: 16 U/L (ref 0–35)
AST: 22 U/L (ref 0–37)
Albumin: 4.6 g/dL (ref 3.5–5.2)
Alkaline Phosphatase: 99 U/L (ref 39–117)
BUN: 10 mg/dL (ref 6–23)
Calcium: 10 mg/dL (ref 8.4–10.5)
Chloride: 106 mEq/L (ref 96–112)
Potassium: 4.3 mEq/L (ref 3.5–5.3)

## 2011-07-30 MED ORDER — GADOBENATE DIMEGLUMINE 529 MG/ML IV SOLN
15.0000 mL | Freq: Once | INTRAVENOUS | Status: AC | PRN
  Administered 2011-07-30: 13 mL via INTRAVENOUS

## 2011-08-04 ENCOUNTER — Ambulatory Visit (HOSPITAL_BASED_OUTPATIENT_CLINIC_OR_DEPARTMENT_OTHER): Payer: Medicare Other | Admitting: Oncology

## 2011-08-04 VITALS — BP 182/83 | HR 86 | Temp 97.9°F | Ht 64.5 in | Wt 147.1 lb

## 2011-08-04 DIAGNOSIS — C50919 Malignant neoplasm of unspecified site of unspecified female breast: Secondary | ICD-10-CM

## 2011-08-04 DIAGNOSIS — Z170 Estrogen receptor positive status [ER+]: Secondary | ICD-10-CM

## 2011-08-04 NOTE — Progress Notes (Signed)
ID: Shelly Willis  HPI:  The patient had routine screening mammography 04/21/2011. This showed a possible distortion in the right breast. The patient was brought back for additional views October 3 which confirmed the area of distortion. Ultrasound showed a 1.8 cm suspicious area and this was biopsied October 17, showing [facility] [id number]) an invasive mammary carcinoma with lobular features. This was strongly estrogen and progesterone receptor positive, HER-2 negative, with an elevated MIB-1. The patient was then referred to Dr. Wenda Low and bilateral breast MRIs were obtained October 22 confirming a 2.4 cm mass in the upper right breast at the 12:00 position. There wwas some right nipple retraction and skin thickening but this is felt to be secondary to post biopsy change. There were no other suspicious areas of concern. Specifically there was no evidence of axillary or internal mammary adenopathy.     Interval History: At the last visit here the patient was noted to have an elevated CA 27-29. She was set up for a CT of the chest and a bone scan. Those films are detailed below, but specifically there were 3 areas of concern on the bone scan, in the mid cervical spine, at T8, and at L5. CT of the chest showed in addition to the breast mass, significant right axillary and right subpectoral adenopathy.  I called Ms. Nelms with this information and allergies her to the possibility of metastatic disease. I set her up for MRIs of the thoracic and lumbar spine, and to come and see me today to review those results.  ROS: She actually has had pain at the T8 level more left than right, for about 6 months. The only time she experiences this however is when she is lying twisted up on the couch, watching TV. The pain is not constant and today during the visit there was no back pain whatsoever. A detailed review of systems was otherwise negative except for minimal sinus symptoms and in particular there was no  increase in frequency or intensity of headaches (she usually has about 3 a month and does have a remote history of migraines). There have been no nausea or vomiting no photophobia no visual changes no neck stiffness (she is status post cervical laminectomy) and no dizziness or gait imbalance.  She is tolerating the letrozole without any unusual side effects other than mild hot flashes.  Medications: I have reviewed the patient's current medications.  Current Outpatient Prescriptions  Medication Sig Dispense Refill  . calcium citrate-vitamin D 200-200 MG-UNIT TABS Take 1 tablet by mouth daily.        Marland Kitchen letrozole (FEMARA) 2.5 MG tablet Take 2.5 mg by mouth daily.        .           Objective:  Filed Vitals:   08/04/11 1621  BP: 182/83  Pulse: 86  Temp: 97.9 F (36.6 C)    Physical Exam:    Sclerae unicteric  Oropharynx clear  No peripheral adenopathy  Lungs clear -- no rales or rhonchi  Heart regular rate and rhythm  Abdomen benign  MSK no focal spinal tenderness, no peripheral edema  Neuro nonfocal  Breast exam: I do not palpate any right axillary adenopathy or any mass in the right breast, which also shows no skin changes and no nipple retraction. The left breast is unremarkable  Lab Results: Her CA 27-29 has decreased from 115 at baseline, 278 after one month of letrozole. Her alkaline phosphatase also has normalized.  CMP  Component Value Date/Time   NA 141 07/30/2011 1035   K 4.3 07/30/2011 1035   CL 106 07/30/2011 1035   CO2 25 07/30/2011 1035   GLUCOSE 84 07/30/2011 1035   BUN 10 07/30/2011 1035   CREATININE 0.86 07/30/2011 1035   CALCIUM 10.0 07/30/2011 1035   PROT 7.5 07/30/2011 1035   ALBUMIN 4.6 07/30/2011 1035   AST 22 07/30/2011 1035   ALT 16 07/30/2011 1035   ALKPHOS 99 07/30/2011 1035   BILITOT 0.3 07/30/2011 1035   GFRNONAA 67* 06/21/2011 0810   GFRAA 78* 06/21/2011 0810    CBC Lab Results  Component Value Date   WBC 7.1 07/30/2011    HGB 13.7 07/30/2011   HCT 41.0 07/30/2011   MCV 93.7 07/30/2011   PLT 306 07/30/2011   NEUTROABS 3.5 07/30/2011    Studies/Results:  Ct Chest W Contrast  07/14/2011  *RADIOLOGY REPORT*  Clinical Data: Breast cancer staging, chemotherapy ongoing  CT CHEST WITH CONTRAST  Technique:  Multidetector CT imaging of the chest was performed following the standard protocol during bolus administration of intravenous contrast.  Contrast: 80mL OMNIPAQUE IOHEXOL 300 MG/ML IV SOLN  Comparison: None.  Findings: 1.4 x 1.6 cm lesion in the right upper breast (series 2/image 30), likely corresponding to biopsy proven invasive breast cancer.  2.9 x 3.4 cm subpectoral soft tissue mass in the right upper chest wall (series 2/image 8), compatible with metastasis.  Enlarged right axillary lymph nodes measuring up to 3.1 x 1.5 cm (series 2/image 15), likely reflecting nodal metastases.  Biapical pleural parenchymal scarring.  Mild centrilobular emphysematous changes.  No suspicious pulmonary nodules.  No pleural effusion or pneumothorax.  Visualized thyroid is unremarkable.  The heart is normal in size.  No pericardial effusion.  Mild coronary atherosclerosis.  Atherosclerotic calcifications of the aortic arch.  No suspicious mediastinal, hilar, or left axillary lymphadenopathy.  Visualized upper abdomen is notable for cholecystectomy clips.  Degenerative changes of the visualized thoracolumbar spine.  No focal osseous lesions.  IMPRESSION: 1.6 cm lesion in the right upper breast, likely corresponding to biopsy-proven invasive breast cancer.  3.4 cm subpectoral soft tissue mass in the right upper chest wall, compatible with metastasis.  Suspected right axillary nodal metastases measuring up to 1.5 cm short axis.  Critical Value/emergent results were called by telephone at the time of interpretation on 07/14/2011  at 0855 hours  to  Dr Jeanette Caprice, who verbally acknowledged these results.  Original Report Authenticated By:  Charline Bills, M.D.   Mr Thoracic Spine W Wo Contrast  07/30/2011  *RADIOLOGY REPORT*  Clinical Data: Metastatic right breast cancer.  Abnormal bone scan.  MRI THORACIC SPINE WITHOUT AND WITH CONTRAST  Technique:  Multiplanar and multiecho pulse sequences of the thoracic spine were obtained without and with intravenous contrast.  Contrast: 13mL MULTIHANCE GADOBENATE DIMEGLUMINE 529 MG/ML IV SOLN  Comparison: Bone scan and chest CT dated 07/14/2011  Findings: On the scout image there is a 1.5 cm area of increased signal intensity in the region of the occipital horn of the right lateral ventricle of the brain on image 1 of series 2.  This may represent choroid plexus but it appears different than the opposite side and in this patient with known metastatic breast cancer, the possibility of  brain metastasis should be considered.  CT scan with and without contrast could assess this area although brain MRI with and without contrast would be more sensitive for other brain metastases.  The MRI of the thoracic spine  extends from C6-7 through L1-2. There is an enhancing lesion in the left side of the T8 vertebral body with a focal fracture of the superior endplate of T8.  This is worrisome for metastatic disease.  Aggressive Schmorl's node might give this appearance although the extent of the enhancement would be atypical for Schmorl's node.  This is more likely metastatic disease and correlates with the abnormal activity on bone scan.  The remainder of the thoracic spine and spinal cord is essentially normal. There is degenerative disc disease at C6-7 and C7-T1 as well as a tiny disc bulge at T1-2.  IMPRESSION:  1.  Lesion in the left side of the T8 vertebral body with small fracture of the superior endplate.  This enhances after contrast and is worrisome for metastatic disease.  Aggressive Schmorl's node might give this appearance although I think this is unlikely. 2.  Possible mass in the right occipital lobe of  the brain on the scout image as described above.  Original Report Authenticated By: Gwynn Burly, M.D.   Mr Lumbar Spine W Wo Contrast  07/30/2011  *RADIOLOGY REPORT*  Clinical Data: Metastatic breast cancer.  MRI LUMBAR SPINE WITHOUT AND WITH CONTRAST  Technique:  Multiplanar and multiecho pulse sequences of the lumbar spine were obtained without and with intravenous contrast.  Contrast: 13mL MULTIHANCE GADOBENATE DIMEGLUMINE 529 MG/ML IV SOLN  Comparison: Bone scan dated 07/14/2011 and CT scan of the abdomen and pelvis dated 02/28/2009  Findings: Scan extends from T12-L1 through the sacrum.  Tip of the conus is at T12-L1 and appears normal.   The patient has moderate left facet arthritis at L4-5 and L5-S1 which correlates with the area of abnormal activity on bone scan. No findings suggestive of metastatic disease of the lumbar spine or sacrum.  There is no significant degenerative disc disease in the lumbar spine.  No disc protrusions or spinal or foraminal stenosis.  No bone destruction or fracture.  Slight right facet arthritis at L4-5 and L5-S1.  IMPRESSION:  1. Moderate left facet arthritis at L4-5 and L5-S1 which is felt to account for the activity on bone scan.  No other significant abnormality of the lumbar spine. 2.  No evidence of metastatic disease in the lumbar spine.  Original Report Authenticated By: Gwynn Burly, M.D.   Nm Bone Scan Whole Body  07/14/2011  *RADIOLOGY REPORT*  Clinical Data: Breast cancer.  NUCLEAR MEDICINE WHOLE BODY BONE SCINTIGRAPHY  Technique:  Whole body anterior and posterior images were obtained approximately 3 hours after intravenous injection of radiopharmaceutical.  Radiopharmaceutical: 23.4 millicuries technetium MDP.  Comparison: CT chest 07/14/2011.  Findings: There is a focal area of increased uptake in the left aspect of the T8 vertebral body.  This appears to correlate with a subtle lesion on the CT scan.  There is also a focal area of uptake in the  right side of the mid cervical spine and on the left side at L5.  These lesions are worrisome for metastasis.  Uptake in the shoulders and right sternoclavicular joint are likely due to degenerative disease.  Moderate uptake around the left hip joint is likely due to degenerative disease.  IMPRESSION:  1.  Findings suspicious for metastatic disease involving the cervical, thoracic and lumbar spines.  MRI may be helpful for further evaluation. 2.  Areas of probable degenerative uptake in the shoulders, right sternoclavicular joint and left hip.  Original Report Authenticated By: P. Loralie Champagne, M.D.    Assessment: 70 year-old [place]  woman, status post right breast biopsy October of 2012 for what now clinically appears to be a T2 N2 MX  invasive lobular breast cancer, estrogen and progesterone receptor positive, HER-2 not amplified, with an elevated MIB-1-1, on letrozole since the first week in November, with evidence of response.   Plan: We spent the better part of her hour-long visit today trying to figure out to questions, 1 whether we should biopsy the T8 abnormality and 2 the asymmetry noted on the scout view of the thoracic MRI, which in my opinion is unlikely whether or we should proceed to a brain MRI to further evaluate the asymmetry noted on the scout view of the thoracic MRI. After much discussion we agreed that we would do the brain MRI, since that would mandate a change in treatment, and since she turns out to have 2 cousins with a history of primary brain cancer.  The issue at T8 is more complex. If this were cancer and she had significant pain there we would irradiate. But she is not having much pain at all and so even if we knew this was cancer all we would do is continue the letrozole. (We would consider bisphosphonates but she has poor dentition and I would be very concerned in her case regarding the possibility of osteonecrosis.)  Accordingly she is being scheduled for the brain  MRI. She will call the next day to prompt [place] to call her with results. She will see me again in early February after having a repeat MRI of the breast, but I am impressed out how poorly the. breast MRI showed her lobular breast cancer as compared to the CT scan of the chest. We will repeat an MRI of the lumbar area 3-4 months from now. Meanwhile she knows to call for any other problems that may develop before the next visit.  Obelia Bonello C 08/04/2011

## 2011-08-05 ENCOUNTER — Telehealth: Payer: Self-pay | Admitting: Oncology

## 2011-08-05 NOTE — Telephone Encounter (Signed)
called pts husband and informed him of MRI of the Brain appt for 08/09/2011

## 2011-08-09 ENCOUNTER — Ambulatory Visit (HOSPITAL_COMMUNITY)
Admission: RE | Admit: 2011-08-09 | Discharge: 2011-08-09 | Disposition: A | Discharge status: Home / Self Care | Payer: Medicare Other | Source: Ambulatory Visit | Attending: Oncology | Admitting: Oncology

## 2011-08-09 DIAGNOSIS — I6789 Other cerebrovascular disease: Secondary | ICD-10-CM | POA: Insufficient documentation

## 2011-08-09 DIAGNOSIS — C50919 Malignant neoplasm of unspecified site of unspecified female breast: Secondary | ICD-10-CM | POA: Insufficient documentation

## 2011-08-09 DIAGNOSIS — R930 Abnormal findings on diagnostic imaging of skull and head, not elsewhere classified: Secondary | ICD-10-CM | POA: Insufficient documentation

## 2011-08-09 MED ORDER — GADOBENATE DIMEGLUMINE 529 MG/ML IV SOLN
13.0000 mL | Freq: Once | INTRAVENOUS | Status: AC | PRN
  Administered 2011-08-09: 13 mL via INTRAVENOUS

## 2011-08-11 ENCOUNTER — Telehealth: Payer: Self-pay | Admitting: *Deleted

## 2011-08-11 NOTE — Telephone Encounter (Signed)
Pt. Called for results of her brain MRI done 08/09/11.  She will be in school the rest of today, so please have Dr. Darnelle Catalan call anytime after that.  Call back is [phone]

## 2011-08-13 ENCOUNTER — Telehealth: Payer: Self-pay | Admitting: *Deleted

## 2011-08-13 NOTE — Telephone Encounter (Signed)
Patient s/p MRI of brain.  "No acute or metastatic intracranial metastatic intracranial abnormality.  Chronic small vessel disease."  Will notify providers.

## 2011-09-30 ENCOUNTER — Ambulatory Visit (HOSPITAL_COMMUNITY)
Admission: RE | Admit: 2011-09-30 | Discharge: 2011-09-30 | Disposition: A | Discharge status: Home / Self Care | Payer: Medicare Other | Source: Ambulatory Visit | Attending: Oncology | Admitting: Oncology

## 2011-09-30 ENCOUNTER — Other Ambulatory Visit: Payer: Medicare Other | Admitting: Lab

## 2011-09-30 DIAGNOSIS — C50919 Malignant neoplasm of unspecified site of unspecified female breast: Secondary | ICD-10-CM

## 2011-09-30 DIAGNOSIS — N63 Unspecified lump in unspecified breast: Secondary | ICD-10-CM | POA: Insufficient documentation

## 2011-09-30 DIAGNOSIS — R599 Enlarged lymph nodes, unspecified: Secondary | ICD-10-CM | POA: Insufficient documentation

## 2011-09-30 DIAGNOSIS — Z9221 Personal history of antineoplastic chemotherapy: Secondary | ICD-10-CM | POA: Insufficient documentation

## 2011-09-30 LAB — CBC WITH DIFFERENTIAL/PLATELET
BASO%: 0.5 % (ref 0.0–2.0)
Basophils Absolute: 0 10*3/uL (ref 0.0–0.1)
EOS%: 1.8 % (ref 0.0–7.0)
Eosinophils Absolute: 0.1 10*3/uL (ref 0.0–0.5)
HCT: 39.9 % (ref 34.8–46.6)
HGB: 13.5 g/dL (ref 11.6–15.9)
MCH: 31.9 pg (ref 25.1–34.0)
MCV: 94.2 fL (ref 79.5–101.0)
MONO%: 8.1 % (ref 0.0–14.0)
NEUT#: 3.7 10*3/uL (ref 1.5–6.5)
NEUT%: 49.6 % (ref 38.4–76.8)
Platelets: 294 10*3/uL (ref 145–400)
RDW: 13.6 % (ref 11.2–14.5)

## 2011-09-30 LAB — COMPREHENSIVE METABOLIC PANEL
ALT: 16 U/L (ref 0–35)
Albumin: 4.4 g/dL (ref 3.5–5.2)
BUN: 15 mg/dL (ref 6–23)
CO2: 26 mEq/L (ref 19–32)
Calcium: 10.9 mg/dL — ABNORMAL HIGH (ref 8.4–10.5)
Chloride: 104 mEq/L (ref 96–112)
Creatinine, Ser: 0.88 mg/dL (ref 0.50–1.10)
Potassium: 4.7 mEq/L (ref 3.5–5.3)

## 2011-09-30 MED ORDER — GADOBENATE DIMEGLUMINE 529 MG/ML IV SOLN
14.0000 mL | Freq: Once | INTRAVENOUS | Status: AC | PRN
  Administered 2011-09-30: 14 mL via INTRAVENOUS

## 2011-10-01 ENCOUNTER — Telehealth: Payer: Self-pay | Admitting: *Deleted

## 2011-10-01 NOTE — Telephone Encounter (Signed)
Received call from [facility] stating pt had MRI/Breast & radiologist recommends bil U/S dut to areas of L & R breast & ? Proceed with BX & wants to make sure Dr. Darnelle Catalan has seen the report in case he would like to call the pt before they try to reach her.  Message given to Dr Darnelle Catalan.

## 2011-10-04 ENCOUNTER — Other Ambulatory Visit: Payer: Self-pay | Admitting: Oncology

## 2011-10-07 ENCOUNTER — Ambulatory Visit (HOSPITAL_BASED_OUTPATIENT_CLINIC_OR_DEPARTMENT_OTHER): Payer: Medicare Other | Admitting: Oncology

## 2011-10-07 ENCOUNTER — Telehealth: Payer: Self-pay | Admitting: Oncology

## 2011-10-07 VITALS — BP 180/88 | HR 75 | Temp 97.7°F | Ht 64.0 in | Wt 153.7 lb

## 2011-10-07 DIAGNOSIS — C50919 Malignant neoplasm of unspecified site of unspecified female breast: Secondary | ICD-10-CM

## 2011-10-07 DIAGNOSIS — C50419 Malignant neoplasm of upper-outer quadrant of unspecified female breast: Secondary | ICD-10-CM

## 2011-10-07 DIAGNOSIS — M25569 Pain in unspecified knee: Secondary | ICD-10-CM

## 2011-10-07 NOTE — Progress Notes (Signed)
ID: Shelly Willis  HPI:  The patient had routine screening mammography 04/21/2011. This showed a possible distortion in the right breast. The patient was brought back for additional views October 3 which confirmed the area of distortion. Ultrasound showed a 1.8 cm suspicious area and this was biopsied October 17, showing [facility] [id number]) an invasive mammary carcinoma with lobular features. This was strongly estrogen and progesterone receptor positive, HER-2 negative, with an elevated MIB-1. The patient was then referred to Dr. Wenda Low and bilateral breast MRIs were obtained October 22 confirming a 2.4 cm mass in the upper right breast at the 12:00 position. There wwas some right nipple retraction and skin thickening but this is felt to be secondary to post biopsy change. There were no other suspicious areas of concern. Specifically there was no evidence of axillary or internal mammary adenopathy.     Interval History: The patient returns today with her husband to review results of her neoadjuvant treatment with letrozole. She is tolerating the pill moderately well. She is having hot flashes which keep her up at night, and this is making her feel tired, but she continues to work full-time. She has some vaginal dryness. We discussed how to deal with that. She feels forgetful. Otherwise as far as I can tell she is not developing the arthralgias and myalgias that some patients can get from this medication.  ROS: She does have significant pain in the left knee. This keeps her from walking as much as she would like. She has fallen on that knee several times over the last few years. This is not going to be related to the letrozole. The exam of the knee as noted below I does not suggest an inflammatory arthritis. She has mild headaches which are not a new problem for her. Otherwise a detailed review of systems was negative and in particular she denies anxiety or depression. Her husband  concurs.  Medications: I have reviewed the patient's current medications.  Current Outpatient Prescriptions  Medication Sig Dispense Refill  . calcium citrate-vitamin D 200-200 MG-UNIT TABS Take 1 tablet by mouth daily.        Marland Kitchen letrozole (FEMARA) 2.5 MG tablet Take 2.5 mg by mouth daily.        .           Objective: Middle-aged white woman who appears mildly anxious  Filed Vitals:   10/07/11 0853  BP: 180/88  Pulse: 75  Temp: 97.7 F (36.5 C)    Physical Exam:    Sclerae unicteric  Oropharynx clear  No peripheral adenopathy  Lungs clear -- no rales or rhonchi  Heart regular rate and rhythm  Abdomen benign  MSK no focal spinal tenderness, no peripheral edema  Neuro nonfocal  Breast exam: I do not palpate any mass in the right breast. The right axilla is negative. The left breast is unremarkable.   Lab Results: CMP      Component Value Date/Time   NA 143 09/30/2011 0851   K 4.7 09/30/2011 0851   CL 104 09/30/2011 0851   CO2 26 09/30/2011 0851   GLUCOSE 97 09/30/2011 0851   BUN 15 09/30/2011 0851   CREATININE 0.88 09/30/2011 0851   CALCIUM 10.9* 09/30/2011 0851   PROT 7.7 09/30/2011 0851   ALBUMIN 4.4 09/30/2011 0851   AST 20 09/30/2011 0851   ALT 16 09/30/2011 0851   ALKPHOS 112 09/30/2011 0851   BILITOT 0.3 09/30/2011 0851   GFRNONAA 67* 06/21/2011 0810   GFRAA  78* 06/21/2011 0810    CBC Lab Results  Component Value Date   WBC 7.4 09/30/2011   HGB 13.5 09/30/2011   HCT 39.9 09/30/2011   MCV 94.2 09/30/2011   PLT 294 09/30/2011   NEUTROABS 3.7 09/30/2011    Studies/Results:  Ct Chest W Contrast  07/14/2011  *RADIOLOGY REPORT*  Clinical Data: Breast cancer staging, chemotherapy ongoing  CT CHEST WITH CONTRAST  Technique:  Multidetector CT imaging of the chest was performed following the standard protocol during bolus administration of intravenous contrast.  Contrast: 80mL OMNIPAQUE IOHEXOL 300 MG/ML IV SOLN  Comparison: None.  Findings: 1.4 x 1.6 cm lesion in the  right upper breast (series 2/image 30), likely corresponding to biopsy proven invasive breast cancer.  2.9 x 3.4 cm subpectoral soft tissue mass in the right upper chest wall (series 2/image 8), compatible with metastasis.  Enlarged right axillary lymph nodes measuring up to 3.1 x 1.5 cm (series 2/image 15), likely reflecting nodal metastases.  Biapical pleural parenchymal scarring.  Mild centrilobular emphysematous changes.  No suspicious pulmonary nodules.  No pleural effusion or pneumothorax.  Visualized thyroid is unremarkable.  The heart is normal in size.  No pericardial effusion.  Mild coronary atherosclerosis.  Atherosclerotic calcifications of the aortic arch.  No suspicious mediastinal, hilar, or left axillary lymphadenopathy.  Visualized upper abdomen is notable for cholecystectomy clips.  Degenerative changes of the visualized thoracolumbar spine.  No focal osseous lesions.  IMPRESSION: 1.6 cm lesion in the right upper breast, likely corresponding to biopsy-proven invasive breast cancer.  3.4 cm subpectoral soft tissue mass in the right upper chest wall, compatible with metastasis.  Suspected right axillary nodal metastases measuring up to 1.5 cm short axis.  Critical Value/emergent results were called by telephone at the time of interpretation on 07/14/2011  at 0855 hours  to  Dr Jeanette Caprice, who verbally acknowledged these results.  Original Report Authenticated By: Charline Bills, M.D.   Mr Thoracic Spine W Wo Contrast  07/30/2011  *RADIOLOGY REPORT*  Clinical Data: Metastatic right breast cancer.  Abnormal bone scan.  MRI THORACIC SPINE WITHOUT AND WITH CONTRAST  Technique:  Multiplanar and multiecho pulse sequences of the thoracic spine were obtained without and with intravenous contrast.  Contrast: 13mL MULTIHANCE GADOBENATE DIMEGLUMINE 529 MG/ML IV SOLN  Comparison: Bone scan and chest CT dated 07/14/2011  Findings: On the scout image there is a 1.5 cm area of increased signal intensity in  the region of the occipital horn of the right lateral ventricle of the brain on image 1 of series 2.  This may represent choroid plexus but it appears different than the opposite side and in this patient with known metastatic breast cancer, the possibility of  brain metastasis should be considered.  CT scan with and without contrast could assess this area although brain MRI with and without contrast would be more sensitive for other brain metastases.  The MRI of the thoracic spine extends from C6-7 through L1-2. There is an enhancing lesion in the left side of the T8 vertebral body with a focal fracture of the superior endplate of T8.  This is worrisome for metastatic disease.  Aggressive Schmorl's node might give this appearance although the extent of the enhancement would be atypical for Schmorl's node.  This is more likely metastatic disease and correlates with the abnormal activity on bone scan.  The remainder of the thoracic spine and spinal cord is essentially normal. There is degenerative disc disease at C6-7 and C7-T1 as well  as a tiny disc bulge at T1-2.  IMPRESSION:  1.  Lesion in the left side of the T8 vertebral body with small fracture of the superior endplate.  This enhances after contrast and is worrisome for metastatic disease.  Aggressive Schmorl's node might give this appearance although I think this is unlikely. 2.  Possible mass in the right occipital lobe of the brain on the scout image as described above.  Original Report Authenticated By: Gwynn Burly, M.D.   Mr Lumbar Spine W Wo Contrast  07/30/2011  *RADIOLOGY REPORT*  Clinical Data: Metastatic breast cancer.  MRI LUMBAR SPINE WITHOUT AND WITH CONTRAST  Technique:  Multiplanar and multiecho pulse sequences of the lumbar spine were obtained without and with intravenous contrast.  Contrast: 13mL MULTIHANCE GADOBENATE DIMEGLUMINE 529 MG/ML IV SOLN  Comparison: Bone scan dated 07/14/2011 and CT scan of the abdomen and pelvis dated  02/28/2009  Findings: Scan extends from T12-L1 through the sacrum.  Tip of the conus is at T12-L1 and appears normal.   The patient has moderate left facet arthritis at L4-5 and L5-S1 which correlates with the area of abnormal activity on bone scan. No findings suggestive of metastatic disease of the lumbar spine or sacrum.  There is no significant degenerative disc disease in the lumbar spine.  No disc protrusions or spinal or foraminal stenosis.  No bone destruction or fracture.  Slight right facet arthritis at L4-5 and L5-S1.  IMPRESSION:  1. Moderate left facet arthritis at L4-5 and L5-S1 which is felt to account for the activity on bone scan.  No other significant abnormality of the lumbar spine. 2.  No evidence of metastatic disease in the lumbar spine.  Original Report Authenticated By: Gwynn Burly, M.D.   *RADIOLOGY REPORT*  Clinical Data: to assess response to neoadjuvant chemotherapy,  carcinoma right breast  BILATERAL BREAST MRI WITH AND WITHOUT CONTRAST  Technique: Multiplanar, multisequence MR images of both breasts  were obtained prior to and following the intravenous administration  of 14ml of Multihance. Three dimensional images were evaluated at  the independent DynaCad workstation.  Comparison: prior mri performed at [facility] on  06/21/11, mammograms dated 04/19/11 (bilateral) and 06/16/11 (right)  Findings: There is moderate background parenchymal enhancement.  The mass previously identified in the 12 o'clock position of the  right breast is no longer visible. However, enlarged right  axillary lymph nodes have developed when compared to the prior  study. The largest of these is seen in the high right axilla and  measures 25 x 10 mm. While not enlarged by a absolute size  criteria, this does represent a significant enlargement when  compared to the prior study.  On the left, there are several small cysts in the central to  inferior breast. Additionally, there are  three focal areas of  enhancement that were not present previously. In the 12 o'clock  position at the middle third depth, there is a rim-enhancing 9 mm  round circumscribed mass. On inversion recovery images, this shows  mild T2 hyperintensity centrally. At the junction of the anterior  and middle third depth in the 9 o'clock position, there is a 6 mm  enhancing mass showing irregular shape. It demonstrates moderate  to rapid initial enhancement with plateau type kinetics. Finally,  at the junction of the anterior and middle thirds in the 3:30  position, there is a 4 mm focus of enhancement which demonstrates  moderate to rapid initial enhancement with persistent delayed phase  kinetics.  IMPRESSION:  The known malignancy in the 12 o'clock position of the right breast  appears to have resolved with neoadjuvant chemotherapy.  However, lymph nodes in the right axilla had enlarged, with one  particularly hypervascular dominant lymph node approaching  suspicious sizes.  Additionally, there are three foci of enhancement on the left that  were not present previously. The focus in the 3:30 position  appears benign by size, appearance, and enhancement  characteristics. The focus in the 12 o'clock position appears most  consistent with an inflammatory cyst. In the 9 o'clock position  however, the small mass appears irregular and shows indeterminate  kinetics.  BI-RADS CATEGORY 4: Suspicious abnormality - biopsy should be  considered.  Recommendation:  1. Recommend a second look ultrasound of the right axilla. Given  the interval enlargement of the right axillary lymph node,  ultrasound guided core needle biopsy may be warranted.  2. Recommend second look ultrasound of mass in the 9 o'clock  position of the left breast. If it is seen by ultrasound, biopsy  would be suggested. If it is not seen by ultrasound, MRI-guided  biopsy may be necessary.     Assessment: 71 year-old [place]  woman, status post right breast biopsy October of 2012 for what now clinically appears to be a T2 N2 MX  invasive lobular breast cancer, estrogen and progesterone receptor positive, HER-2 not amplified, with an elevated MIB-1-1, on letrozole since the first week in November, with evidence of response.   Plan: We went over the results of her repeat breast MRI, and I think there are some questionable statements there. The MRI was not compared with her CT of the chest, which was obtained in November and which showed clear right axillary adenopathy, measuring 3.1 cm. The right axillary lymph nodes seen on the current MRI therefore are not new and in fact they are smaller than they were when seen by CT scan. The fact that the primary breast mass is just about disappeared radiographically tells me that the patient is having a good response to letrozole. It would be unusual for her to have growth of lymph nodes and shrinkage of the primary mass. I think much of this confusioon is due to the poor quality of her initial, November breast MRI.  The new MRI in addition is showing 3 small spots in the left (contralateral) breast. It is not clear to me at this point whether we should biopsy those spots, continue the letrozole another 3 months and repeat an MRI, or proceed to bilateral mastectomies, or a right modified radical mastectomy with biopsies of the left breast. I think we need input from Dr. Daphine Deutscher and I am setting the patient up to see him in the near future.  Examination of the right knee does not show any effusion or heat. I think she simply had some trauma there. She may benefit from orthopedic evaluation and we are setting that up through [facility] orthopedics.  I have made a return appointment for Ms. Murrel with me in 2 months. She will continue on the letrozole. She will have a repeat MRI of the thoracic spine before that visit.     Ashelynn Marks C 10/07/2011

## 2011-10-07 NOTE — Telephone Encounter (Signed)
Shelly Willis called from [facility] regarding the pt's appt to see dr Daphine Deutscher which ahs been scheduled for 10/28/2011 and the pt is aware. Faxed over the referral packet back to dr stanley's office for this pt's appt and they will contact her with the appt. Pt has her next f/u appt with dr Darnelle Catalan

## 2011-10-15 ENCOUNTER — Other Ambulatory Visit: Payer: Self-pay | Admitting: *Deleted

## 2011-10-15 MED ORDER — GABAPENTIN 300 MG PO CAPS: 300.0000 mg | ORAL_CAPSULE | Freq: Every day | ORAL | Status: DC

## 2011-10-28 ENCOUNTER — Ambulatory Visit (INDEPENDENT_AMBULATORY_CARE_PROVIDER_SITE_OTHER): Payer: Medicare Other | Admitting: Surgery

## 2011-10-28 ENCOUNTER — Encounter (INDEPENDENT_AMBULATORY_CARE_PROVIDER_SITE_OTHER): Payer: Self-pay | Admitting: Surgery

## 2011-10-28 ENCOUNTER — Telehealth (INDEPENDENT_AMBULATORY_CARE_PROVIDER_SITE_OTHER): Payer: Self-pay | Admitting: General Surgery

## 2011-10-28 VITALS — BP 162/92 | HR 84 | Temp 97.5°F | Resp 18 | Ht 65.0 in | Wt 156.0 lb

## 2011-10-28 DIAGNOSIS — C50419 Malignant neoplasm of upper-outer quadrant of unspecified female breast: Secondary | ICD-10-CM

## 2011-10-28 DIAGNOSIS — Z853 Personal history of malignant neoplasm of breast: Secondary | ICD-10-CM

## 2011-10-28 DIAGNOSIS — N6320 Unspecified lump in the left breast, unspecified quadrant: Secondary | ICD-10-CM

## 2011-10-28 NOTE — Telephone Encounter (Signed)
Contacted Kathy at the [facility] to schedule the [place] core bx. She will notify the patient of date/time

## 2011-10-28 NOTE — Progress Notes (Signed)
Shelly Willis 71 y.o.  Body mass index is 25.96 kg/(m^2).  Patient Active Problem List  Diagnoses  . 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 Vesicare (Solifenacin Succinate)     Headaches and upset stomach     Past Surgical History  Procedure Date  . Cholecystectomy 1993  . Tubal ligation 1977  . Abdominal hysterectomy 1997  . Bladder surgery 2011    growth attached to bladder stem   . Bilateral salpingoophorectomy   . Ruptured disc surgery 1988 1988   HAWKINS,EDWARD L, MD, MD No diagnosis found.  Shelly Willis came in  Today to discuss her progress.  Her right breast mass has responded remarkably she treatment with letrozole.  On a followup MR she has an area in the left breast at the 9:00 position that was more suspicious. On my physical exam today I can feel no masses in either breast. Axillary are not impressive to physical exam although studies would suggest a right axillary node to remove more prominent.  My recommendation is for her to have a core biopsy of the suspicious area in the left breast. Based on that we will make her surgical plans and we'll discuss with Dr. Darnelle Catalan.   Shelly B. Daphine Deutscher, MD, [facility] [phone] beeper [phone]  10/28/2011 12:09 PM

## 2011-11-02 ENCOUNTER — Other Ambulatory Visit (INDEPENDENT_AMBULATORY_CARE_PROVIDER_SITE_OTHER): Payer: Self-pay | Admitting: Surgery

## 2011-11-02 ENCOUNTER — Ambulatory Visit
Admission: RE | Admit: 2011-11-02 | Discharge: 2011-11-02 | Disposition: A | Discharge status: Home / Self Care | Payer: Medicare Other | Source: Ambulatory Visit | Attending: Surgery | Admitting: Surgery

## 2011-11-02 DIAGNOSIS — N6320 Unspecified lump in the left breast, unspecified quadrant: Secondary | ICD-10-CM

## 2011-11-02 DIAGNOSIS — Z853 Personal history of malignant neoplasm of breast: Secondary | ICD-10-CM

## 2011-11-02 HISTORY — PX: OTHER SURGICAL HISTORY: SHX169

## 2011-11-15 ENCOUNTER — Other Ambulatory Visit: Payer: Self-pay | Admitting: Oncology

## 2011-11-15 ENCOUNTER — Telehealth: Payer: Self-pay | Admitting: *Deleted

## 2011-11-15 NOTE — Telephone Encounter (Addendum)
Husband called.  He would like results of her biopsy.  He is concerned about her lack of energy and memory loss.  Her next MD visit is 12/27/11.  Please call him at home number [phone] Dr. Darnelle Catalan reviewed and per Dr. Darnelle Catalan:  Left Breast BX: no cancer.  She needs to see Dr.Matt Daphine Deutscher next to plan surgery.  Dr. Darnelle Catalan has left him a note.  Pt. Should call his office for appt.  In the meantime, she should continue on the letrozole.  Husband verbalized undetstanding and appreciated the call back.

## 2011-11-19 ENCOUNTER — Telehealth: Payer: Self-pay | Admitting: *Deleted

## 2011-11-19 NOTE — Telephone Encounter (Signed)
This RN returned call to Shelly Willis per his message left with Kaylyn Lim.  Obtained identified answering machine. Message left stating concerns are being follow up regarding appointment with surgeon.

## 2011-11-22 ENCOUNTER — Telehealth: Payer: Self-pay | Admitting: Oncology

## 2011-11-22 ENCOUNTER — Other Ambulatory Visit (INDEPENDENT_AMBULATORY_CARE_PROVIDER_SITE_OTHER): Payer: Self-pay | Admitting: Surgery

## 2011-11-22 ENCOUNTER — Encounter (HOSPITAL_COMMUNITY): Payer: Self-pay | Admitting: Pharmacy Technician

## 2011-11-22 ENCOUNTER — Other Ambulatory Visit: Payer: Self-pay | Admitting: Oncology

## 2011-11-22 ENCOUNTER — Telehealth (INDEPENDENT_AMBULATORY_CARE_PROVIDER_SITE_OTHER): Payer: Self-pay | Admitting: General Surgery

## 2011-11-22 DIAGNOSIS — C50919 Malignant neoplasm of unspecified site of unspecified female breast: Secondary | ICD-10-CM

## 2011-11-22 NOTE — Telephone Encounter (Signed)
I called French Ana from [facility] about this patient.    Pt's husband shared with French Ana that the patient has fatigue and memory loss.   Dr. Darrall Dears last note indicates these two issues, and I followed up with Dr. Darnelle Catalan after speaking with French Ana.   Dr. Darnelle Catalan will review her case and let me know if he would like anything done.    The patient's husband left a message for me Friday concerned that his wife does not have a surgery date scheduled yet.   French Ana indicated that Dr. Daphine Deutscher has written orders and scheduling is in process.

## 2011-11-22 NOTE — Telephone Encounter (Signed)
Call returned from Tami with [facility], she will direct the fatigue and memory loss issue with Dr Princella Pellegrini nurse and we will address the patients surgery.

## 2011-11-22 NOTE — Telephone Encounter (Addendum)
Spoke with Mr Mcgahee today for the second time regarding Shem[place] and her memory loss and fatigue, I had expressed to the patient on 11/19/11  he needed to contact Dr Princella Pellegrini office regarding her physical symptoms. He called again this morning concerned with the same problems, I am contacting Dr Daphine Deutscher regarding her surgery to see if he would like to see her again or just schedule her procedure. Expressed to Mr Baik that I would contact Kaylyn Lim at the cancer center regarding Mrs Glantz symptoms and get back with him .Mr Trager feels that he is getting the run around between both of our clinics, left a message for Tami on her voice mail.

## 2011-11-22 NOTE — Progress Notes (Signed)
The patient's husband, Shelly Willis, call today to discuss the patient's of forgetfulness and fatigue. Since her last visit here Maylynn had the Right axillary lymph node biopsied, and this showed breast cancer similar to the prior, except the Mib-1 in less than 10. I called jermesha sottile tells me that she is sleeping very poorly, wakes up several times at night, chiefly because she has to urinate. We talked about her not drinking about 2 hours before going to bed. I do not think that letrozole is the reason for her forgetfulness, but we will address this with more data after she has her definitive surgery. I did see Dr. Daphine Deutscher today and he told me he is planning to do a lumpectomy and sentinel lymph node sampling soon. Charlote will return to see me on April 29. We shouldhave all the relevant data by then.

## 2011-11-23 ENCOUNTER — Other Ambulatory Visit (INDEPENDENT_AMBULATORY_CARE_PROVIDER_SITE_OTHER): Payer: Self-pay | Admitting: Surgery

## 2011-11-23 DIAGNOSIS — C50919 Malignant neoplasm of unspecified site of unspecified female breast: Secondary | ICD-10-CM

## 2011-11-25 ENCOUNTER — Encounter (HOSPITAL_COMMUNITY): Payer: Self-pay

## 2011-11-25 ENCOUNTER — Encounter (HOSPITAL_COMMUNITY)
Admission: RE | Admit: 2011-11-25 | Discharge: 2011-11-25 | Disposition: A | Discharge status: Home / Self Care | Payer: Medicare Other | Source: Ambulatory Visit | Attending: Surgery | Admitting: Surgery

## 2011-11-25 HISTORY — DX: Nausea with vomiting, unspecified: Z98.890

## 2011-11-25 HISTORY — DX: Other specified postprocedural states: R11.2

## 2011-11-25 LAB — CBC
Platelets: 330 10*3/uL (ref 150–400)
RDW: 13.2 % (ref 11.5–15.5)
WBC: 9.4 10*3/uL (ref 4.0–10.5)

## 2011-11-25 LAB — BASIC METABOLIC PANEL
Chloride: 103 mEq/L (ref 96–112)
GFR calc Af Amer: 72 mL/min — ABNORMAL LOW (ref >90)
Potassium: 4.5 mEq/L (ref 3.5–5.1)
Sodium: 141 mEq/L (ref 135–145)

## 2011-11-25 LAB — SURGICAL PCR SCREEN
MRSA, PCR: NEGATIVE
Staphylococcus aureus: NEGATIVE

## 2011-11-25 MED ORDER — CHLORHEXIDINE GLUCONATE 4 % EX LIQD: 1.0000 "application " | Freq: Once | CUTANEOUS | Status: DC

## 2011-11-25 NOTE — Pre-Procedure Instructions (Signed)
20 Shelly Willis  11/25/2011   Your procedure is scheduled on:  11/30/11  Report to [facility] at 1100 AM.  Call this number if you have problems the morning of surgery: [phone]   Remember:   Do not eat food:After Midnight.  May have clear liquids: up to 4 Hours before arrival.  Clear liquids include soda, tea, black coffee, apple or grape juice, broth.  Take these medicines the morning of surgery with A SIP OF WATER: neurontin,femara   Do not wear jewelry, make-up or nail polish.  Do not wear lotions, powders, or perfumes. You may wear deodorant.  Do not shave 48 hours prior to surgery.  Do not bring valuables to the hospital.  Contacts, dentures or bridgework may not be worn into surgery.  Leave suitcase in the car. After surgery it may be brought to your room.  For patients admitted to the hospital, checkout time is 11:00 AM the day of discharge.   Patients discharged the day of surgery will not be allowed to drive home.  Name and phone number of your driver: family  Special Instructions: CHG Shower Use Special Wash: 1/2 bottle night before surgery and 1/2 bottle morning of surgery.   Please read over the following fact sheets that you were given: Pain Booklet, Coughing and Deep Breathing, MRSA Information and Surgical Site Infection Prevention

## 2011-11-29 MED ORDER — CEFAZOLIN SODIUM 1-5 GM-% IV SOLN
1.0000 g | INTRAVENOUS | Status: AC
  Administered 2011-11-30: 1 g via INTRAVENOUS
  Filled 2011-11-29: qty 50

## 2011-11-29 MED ORDER — HEPARIN SODIUM (PORCINE) 5000 UNIT/ML IJ SOLN
5000.0000 [IU] | Freq: Once | INTRAMUSCULAR | Status: AC
  Administered 2011-11-30: 5000 [IU] via SUBCUTANEOUS
  Filled 2011-11-29: qty 1

## 2011-11-30 ENCOUNTER — Ambulatory Visit
Admission: RE | Admit: 2011-11-30 | Discharge: 2011-11-30 | Disposition: A | Discharge status: Home / Self Care | Payer: Medicare Other | Source: Ambulatory Visit | Attending: Surgery | Admitting: Surgery

## 2011-11-30 ENCOUNTER — Encounter (HOSPITAL_COMMUNITY): Payer: Self-pay | Admitting: Certified Registered Nurse Anesthetist

## 2011-11-30 ENCOUNTER — Ambulatory Visit (HOSPITAL_COMMUNITY)
Admission: RE | Admit: 2011-11-30 | Discharge: 2011-11-30 | Disposition: A | Discharge status: Home / Self Care | Payer: Medicare Other | Source: Ambulatory Visit | Attending: Surgery | Admitting: Surgery

## 2011-11-30 ENCOUNTER — Encounter (HOSPITAL_COMMUNITY)
Admission: RE | Disposition: A | Discharge status: Home / Self Care | Payer: Self-pay | Source: Ambulatory Visit | Attending: Surgery

## 2011-11-30 ENCOUNTER — Ambulatory Visit (HOSPITAL_COMMUNITY): Payer: Medicare Other | Admitting: Certified Registered Nurse Anesthetist

## 2011-11-30 ENCOUNTER — Encounter (HOSPITAL_COMMUNITY): Payer: Self-pay

## 2011-11-30 DIAGNOSIS — C50919 Malignant neoplasm of unspecified site of unspecified female breast: Secondary | ICD-10-CM

## 2011-11-30 DIAGNOSIS — C773 Secondary and unspecified malignant neoplasm of axilla and upper limb lymph nodes: Secondary | ICD-10-CM | POA: Insufficient documentation

## 2011-11-30 DIAGNOSIS — E785 Hyperlipidemia, unspecified: Secondary | ICD-10-CM | POA: Insufficient documentation

## 2011-11-30 DIAGNOSIS — Z01812 Encounter for preprocedural laboratory examination: Secondary | ICD-10-CM | POA: Insufficient documentation

## 2011-11-30 DIAGNOSIS — N6089 Other benign mammary dysplasias of unspecified breast: Secondary | ICD-10-CM | POA: Insufficient documentation

## 2011-11-30 HISTORY — DX: Personal history of other medical treatment: Z92.89

## 2011-11-30 SURGERY — BREAST LUMPECTOMY WITH NEEDLE LOCALIZATION AND AXILLARY SENTINEL LYMPH NODE BX
Anesthesia: General | Site: Breast | Laterality: Right | Wound class: Clean

## 2011-11-30 MED ORDER — LACTATED RINGERS IV SOLN
INTRAVENOUS | Status: DC
  Administered 2011-11-30: 14:00:00 via INTRAVENOUS

## 2011-11-30 MED ORDER — MORPHINE SULFATE 2 MG/ML IJ SOLN: 1.0000 mg | INTRAMUSCULAR | Status: DC | PRN

## 2011-11-30 MED ORDER — LIDOCAINE HCL (CARDIAC) 20 MG/ML IV SOLN
INTRAVENOUS | Status: DC | PRN
  Administered 2011-11-30: 80 mg via INTRAVENOUS

## 2011-11-30 MED ORDER — TECHNETIUM TC 99M SULFUR COLLOID FILTERED
1.0000 | Freq: Once | INTRAVENOUS | Status: AC | PRN
  Administered 2011-11-30: 1 via INTRADERMAL

## 2011-11-30 MED ORDER — LIDOCAINE HCL (PF) 1 % IJ SOLN: INTRAMUSCULAR | Status: DC | PRN

## 2011-11-30 MED ORDER — FENTANYL CITRATE 0.05 MG/ML IJ SOLN
INTRAMUSCULAR | Status: DC | PRN
  Administered 2011-11-30: 25 ug via INTRAVENOUS
  Administered 2011-11-30: 50 ug via INTRAVENOUS
  Administered 2011-11-30: 25 ug via INTRAVENOUS
  Administered 2011-11-30: 50 ug via INTRAVENOUS
  Administered 2011-11-30 (×2): 25 ug via INTRAVENOUS
  Administered 2011-11-30: 50 ug via INTRAVENOUS

## 2011-11-30 MED ORDER — EPHEDRINE SULFATE 50 MG/ML IJ SOLN
INTRAMUSCULAR | Status: DC | PRN
  Administered 2011-11-30 (×3): 5 mg via INTRAVENOUS

## 2011-11-30 MED ORDER — ONDANSETRON HCL 4 MG/2ML IJ SOLN
INTRAMUSCULAR | Status: DC | PRN
  Administered 2011-11-30 (×2): 4 mg via INTRAVENOUS

## 2011-11-30 MED ORDER — OXYCODONE-ACETAMINOPHEN 5-325 MG/5ML PO SOLN: 5.0000 mL | ORAL | Status: AC | PRN

## 2011-11-30 MED ORDER — FENTANYL CITRATE 0.05 MG/ML IJ SOLN
50.0000 ug | INTRAMUSCULAR | Status: DC | PRN
  Administered 2011-11-30: 75 ug via INTRAVENOUS

## 2011-11-30 MED ORDER — LACTATED RINGERS IV SOLN
INTRAVENOUS | Status: DC | PRN
  Administered 2011-11-30 (×2): via INTRAVENOUS

## 2011-11-30 MED ORDER — SODIUM CHLORIDE 0.9 % IV SOLN: 250.0000 mL | INTRAVENOUS | Status: DC | PRN

## 2011-11-30 MED ORDER — SODIUM CHLORIDE 0.9 % IJ SOLN: 3.0000 mL | INTRAMUSCULAR | Status: DC | PRN

## 2011-11-30 MED ORDER — FENTANYL CITRATE 0.05 MG/ML IJ SOLN
INTRAMUSCULAR | Status: AC
  Filled 2011-11-30: qty 2

## 2011-11-30 MED ORDER — BUPIVACAINE-EPINEPHRINE 0.5% -1:200000 IJ SOLN
INTRAMUSCULAR | Status: DC | PRN
  Administered 2011-11-30: 10 mL

## 2011-11-30 MED ORDER — ACETAMINOPHEN 650 MG RE SUPP: 650.0000 mg | RECTAL | Status: DC | PRN

## 2011-11-30 MED ORDER — ONDANSETRON HCL 4 MG/2ML IJ SOLN: 4.0000 mg | Freq: Four times a day (QID) | INTRAMUSCULAR | Status: DC | PRN

## 2011-11-30 MED ORDER — PROPOFOL 10 MG/ML IV BOLUS
INTRAVENOUS | Status: DC | PRN
  Administered 2011-11-30: 170 mg via INTRAVENOUS

## 2011-11-30 MED ORDER — METHYLENE BLUE 1 % INJ SOLN
INTRAMUSCULAR | Status: DC | PRN
  Administered 2011-11-30: 2 mL

## 2011-11-30 MED ORDER — OXYCODONE HCL 5 MG PO TABS: 5.0000 mg | ORAL_TABLET | ORAL | Status: DC | PRN

## 2011-11-30 MED ORDER — DEXAMETHASONE SODIUM PHOSPHATE 4 MG/ML IJ SOLN
INTRAMUSCULAR | Status: DC | PRN
  Administered 2011-11-30: 4 mg via INTRAVENOUS

## 2011-11-30 MED ORDER — SODIUM CHLORIDE 0.9 % IJ SOLN: 3.0000 mL | Freq: Two times a day (BID) | INTRAMUSCULAR | Status: DC

## 2011-11-30 MED ORDER — SODIUM CHLORIDE 0.9 % IJ SOLN
INTRAMUSCULAR | Status: DC | PRN
  Administered 2011-11-30: 3 mL

## 2011-11-30 MED ORDER — ACETAMINOPHEN 325 MG PO TABS: 650.0000 mg | ORAL_TABLET | ORAL | Status: DC | PRN

## 2011-11-30 SURGICAL SUPPLY — 52 items
ADH SKN CLS APL DERMABOND .7 (GAUZE/BANDAGES/DRESSINGS) ×1
APL SKNCLS STERI-STRIP NONHPOA (GAUZE/BANDAGES/DRESSINGS) ×1
APPLIER CLIP 9.375 MED OPEN (MISCELLANEOUS) ×2
APR CLP MED 9.3 20 MLT OPN (MISCELLANEOUS) ×1
BANDAGE ELASTIC 6 VELCRO ST LF (GAUZE/BANDAGES/DRESSINGS) IMPLANT
BENZOIN TINCTURE PRP APPL 2/3 (GAUZE/BANDAGES/DRESSINGS) ×2 IMPLANT
BINDER BREAST LRG (GAUZE/BANDAGES/DRESSINGS) IMPLANT
BINDER BREAST XLRG (GAUZE/BANDAGES/DRESSINGS) ×1 IMPLANT
BLADE SURG 10 STRL SS (BLADE) ×2 IMPLANT
BLADE SURG 15 STRL LF DISP TIS (BLADE) ×1 IMPLANT
BLADE SURG 15 STRL SS (BLADE) ×2
CANISTER SUCTION 2500CC (MISCELLANEOUS) ×1 IMPLANT
CLEANER TIP ELECTROSURG 2X2 (MISCELLANEOUS) ×2 IMPLANT
CLIP APPLIE 9.375 MED OPEN (MISCELLANEOUS) IMPLANT
CLOTH BEACON ORANGE TIMEOUT ST (SAFETY) ×2 IMPLANT
CONT SPEC 4OZ CLIKSEAL STRL BL (MISCELLANEOUS) ×1 IMPLANT
COVER PROBE W GEL 5X96 (DRAPES) ×1 IMPLANT
COVER SURGICAL LIGHT HANDLE (MISCELLANEOUS) ×2 IMPLANT
DECANTER SPIKE VIAL GLASS SM (MISCELLANEOUS) IMPLANT
DERMABOND ADVANCED (GAUZE/BANDAGES/DRESSINGS) ×1
DERMABOND ADVANCED .7 DNX12 (GAUZE/BANDAGES/DRESSINGS) IMPLANT
DEVICE DUBIN SPECIMEN MAMMOGRA (MISCELLANEOUS) IMPLANT
DRAPE PED LAPAROTOMY (DRAPES) ×2 IMPLANT
DRSG PAD ABDOMINAL 8X10 ST (GAUZE/BANDAGES/DRESSINGS) IMPLANT
ELECT REM PT RETURN 9FT ADLT (ELECTROSURGICAL) ×2
ELECTRODE REM PT RTRN 9FT ADLT (ELECTROSURGICAL) ×1 IMPLANT
GAUZE SPONGE 4X4 16PLY XRAY LF (GAUZE/BANDAGES/DRESSINGS) ×2 IMPLANT
GLOVE BIO SURGEON STRL SZ8 (GLOVE) ×2 IMPLANT
GOWN PREVENTION PLUS XXLARGE (GOWN DISPOSABLE) ×4 IMPLANT
GOWN STRL NON-REIN LRG LVL3 (GOWN DISPOSABLE) ×2 IMPLANT
KIT BASIN OR (CUSTOM PROCEDURE TRAY) ×2 IMPLANT
KIT ROOM TURNOVER OR (KITS) ×2 IMPLANT
NDL 18GX1X1/2 (RX/OR ONLY) (NEEDLE) IMPLANT
NDL HYPO 25GX1X1/2 BEV (NEEDLE) IMPLANT
NEEDLE 18GX1X1/2 (RX/OR ONLY) (NEEDLE) ×2 IMPLANT
NEEDLE HYPO 25GX1X1/2 BEV (NEEDLE) ×4 IMPLANT
NS IRRIG 1000ML POUR BTL (IV SOLUTION) ×2 IMPLANT
PACK SURGICAL SETUP 50X90 (CUSTOM PROCEDURE TRAY) ×2 IMPLANT
PAD ARMBOARD 7.5X6 YLW CONV (MISCELLANEOUS) ×4 IMPLANT
PENCIL BUTTON HOLSTER BLD 10FT (ELECTRODE) ×2 IMPLANT
SPONGE GAUZE 4X4 12PLY (GAUZE/BANDAGES/DRESSINGS) ×2 IMPLANT
STRIP CLOSURE SKIN 1/2X4 (GAUZE/BANDAGES/DRESSINGS) ×2 IMPLANT
SUT PROLENE 4 0 PS 2 18 (SUTURE) ×1 IMPLANT
SUT SILK 2 0 FS (SUTURE) IMPLANT
SUT VIC AB 4-0 SH 18 (SUTURE) ×2 IMPLANT
SYR BULB IRRIGATION 50ML (SYRINGE) ×2 IMPLANT
SYR CONTROL 10ML LL (SYRINGE) ×2 IMPLANT
TOWEL OR 17X24 6PK STRL BLUE (TOWEL DISPOSABLE) ×2 IMPLANT
TOWEL OR 17X26 10 PK STRL BLUE (TOWEL DISPOSABLE) ×2 IMPLANT
TUBE CONNECTING 12X1/4 (SUCTIONS) ×1 IMPLANT
WATER STERILE IRR 1000ML POUR (IV SOLUTION) ×2 IMPLANT
YANKAUER SUCT BULB TIP NO VENT (SUCTIONS) ×1 IMPLANT

## 2011-11-30 NOTE — Op Note (Signed)
Surgeon: Wenda Low, MD, FACS  Asst:  None  Anes:  General  Procedure: Right axillary dissection with sentinel lymph node mapping, needle localized right breast lumpectomy  Diagnosis: Right breast cancer status post neoadjuvant chemotherapy  Complications: None noted  EBL:   30 cc cc  Description of Procedure:  The patient was seen in the holding area and evaluated again. Wire localization of the right breast was noted and marked. Procedure was again explained to the patient. Patient was taken to room 15 in given general anesthesia and with her right arm abducted she was prepped with PCMX and draped sterilely. Initially her mammogram posterior needle looked did not accompany her and while this was being retrieved by the OR staff I proceeded to do the x-ray mapping. She had had good response from her neoadjuvant chemotherapy and with that there was a fair amount of sclerotic reaction in the breast as anticipated. As a result the blue dye then injected this was slow to dissipate. Down for generally lower but no localized. I made a curvilinear incision lateral to the pectoralis major muscle and into the axilla.  Found some of the blue dye and localize the hot or areas. However because she had a positive node I was anywhere from a sure retrieve those nodes but also go up in the axilla. As I worked from along the edge of the pack up in the axilla found to be very matted and just very fibrotic. A fat palpated some nodes that were likely previously involved but I do not retrieved but no other obvious hard nodes and the anatomy was pretty well distorted and affected by fairly intense fibrotic reaction. I carefully went through that using clips where needed to control bleeding. I clamped structures it appeared that they would be least vascular to make sure there were no neurovascular structures and then removed this sampling of lymph nodes in the right axilla.  Next I turned my attention to the wire  localization. Elected to drop down make a transverse incision to excise the upper quadrant of, right breast. I went ahead and created superior and inferior flaps in the inferior flap was brought down and and remove the breast tissue from the nipple areolar complex. That was the inferior most portion of my dissection. I went medially and superiorly and then went down to the chest wall and had a wire at this point coming out of my incision and I excised the mass of breast tissue in toto with the wire. The I took a separate second biopsy which was more from the inferior lateral aspect of the incision excision of the breast mass to see if that was involved. This was appeared to be very dense breast tissue was not obviously involved with cancer. Once the mass was excised did a specimen mammogram and the clip was within the specimen. This was examined by Dr. Deboraha Sprang who concurred. The bleeding was controlled with electrocautery. I injected both wounds with half percent Marcaine with epinephrine. No drains were used. The wounds were closed with 4-0 Vicryl in the breast tissue to avoid blistering was closed with staples. The axilla was closed with Dermabond after 4 Vicryl. Patient is taken recovery in satisfactory condition. Patient wants to go home and I will give her some Roxicet to take as needed for pain and nausea be followed up in the office  Matt B. Daphine Deutscher, MD, [facility] [place] [phone]

## 2011-11-30 NOTE — Transfer of Care (Signed)
Immediate Anesthesia Transfer of Care Note  Patient: Shelly Willis  Procedure(s) Performed: Procedure(s) (LRB): BREAST LUMPECTOMY WITH NEEDLE LOCALIZATION AND AXILLARY SENTINEL LYMPH NODE BX (Right)  Patient Location: PACU  Anesthesia Type: General  Level of Consciousness: patient cooperative and confused  Airway & Oxygen Therapy: Patient Spontanous Breathing and Patient connected to nasal cannula oxygen  Post-op Assessment:   Post vital signs: Reviewed and stable Report given to PACU RN, Post -op Vital signs reviewed and stable and Patient moving all extremities X 4 Complications: No apparent anesthesia complications

## 2011-11-30 NOTE — Discharge Instructions (Signed)
Breast Cancer Survivor Follow-Up  Breast cancer begins when cells in the breast divide too rapidly. The extra cells form a lump (tumor). When the cancer is treated, the goal is to get rid of all cancer cells. However, sometimes a few cells survive. These cancer cells can then grow. They become recurrent cancer. This means the cancer comes back after treatment.   Most cases of recurrent breast cancer develop 3 to 5 years after treatment. However, sometimes it comes back just a few months after treatment. Other times, it does not come back until years later. If the cancer comes back in the same area as the first breast cancer, it is called a local recurrence. If the cancer comes back somewhere else in the body, it is called regional recurrence if the site is fairly near the breast or distant recurrence if it is far from the breast. Your caregiver may also use the term metastasize to indicate a cancer that has gone to another part of your body. Treatment is still possible after either kind of recurrence. The cancer can still be controlled.   CAUSES OF RECURRENT CANCER  No one knows exactly why breast cancer starts in the first place. Why the cancer comes back after treatment is also not clear. It is known that certain conditions, called risk factors, can make this more likely. They include:  · Developing breast cancer for the first time before age 60.  · Having breast cancer that involves the lymph nodes. These are small, round pieces of tissue found all over the body. Their job is to help fight infections.  · Having a large tumor. Cancer is more apt to come back if the first tumor was bigger than 2 inches (5 cm).  · Having certain types of breast cancer, such as:  · Inflammatory breast cancer. This rare type grows rapidly and causes the breast to become red and swollen.  · A high-grade tumor. The grade of a tumor indicates how fast it will grow and spread. High-grade tumors grow more quickly than other types.  · HER2  cancer. This refers to the tumor's genetic makeup. Tumors that have this type of gene are more likely to come back after treatment.  · Having close tumor margins. This refers to the space between the tumor and normal, noncancerous cells. If the space is small, the tumor has a greater chance of coming back.  · Having treatment involving a surgery to remove the tumor but not the entire breast (lumpectomy) and no radiation therapy.  CARE AFTER BREAST CANCER  Home Monitoring  Women who have had breast cancer should continue to examine their breasts every month. The goal is to catch the cancer quickly if it comes back. Many women find it helpful to do so on the same day each month and to mark the calendar as a reminder. Let your caregiver know immediately if you have any signs of recurrent breast cancer. Symptoms will vary, depending on where the cancer recurs. The original type of treatment can also make a difference.  Symptoms of local recurrence after a lumpectomy or a recurrence in the opposite breast may include:  · A new lump or thickening in the breast.  · A change in the way the skin looks on the breast (such as a rash, dimpling, or wrinkling).  · Redness or swelling of the breast.  · Changes in the nipple (such as being red, puckered, swollen, or leaking fluid).  Symptoms of a recurrence after a   breast removal surgery (mastectomy) may include:  · A lump or thickening under the skin.  · A thickening around the mastectomy scar.  Symptoms of regional recurrence in the lymph nodes near the breast may include:  · A lump under the arm or above the collarbone.  · Swelling of the arm.  · Pain in the arm, shoulder, or chest.  · Numbness in the hand or arm.  Symptoms of distant recurrence may include:  · A cough that does not go away.  · Trouble breathing or shortness of breath.  · Pain in the bones or the chest. This is pain that lasts or does not respond to rest and medicine.  · Headaches.  · Sudden vision  problems.  · Dizziness.  · Nausea or vomiting.  · Losing weight without trying to.  · Persistent abdominal pain.  · Changes in bowel movements or blood in the stool.  · Yellowing of the skin or eyes (jaundice).  · Blood in the urine or bloody vaginal discharge.  Clinical Monitoring  · It is helpful to keep a schedule of appointments for needed tests and exams. This includes physical exams, breast exams, exams of the lymph nodes, and general exams.  · For the first 3 years after being treated for breast cancer, see your caregiver every 3 to 6 months.  · For years 4 and 5 after breast cancer, see your caregiver every 6 to 12 months.  · After 5 years, see your caregiver at least once a year.  · Regular breast X-rays (mammograms) should continue even if you had a mastectomy.  · A mammogram should be done 1 year after the mammogram that first detected breast cancer.  · A mammogram should be done every 6 to 12 months after that. Follow your caregiver's advice.  · A pelvic exam done by your caregiver checks whether female organs are the normal size and shape. The exam is usually done every year. Ask your caregiver if that schedule is right for you.  · Women taking tamoxifen should report any vaginal bleeding immediately to their caregiver. Tamoxifen is often given to women with a certain type of breast cancer. It has been shown to help prevent recurrence.  · You will need to decide who your primary caregiver will be.  · Most people continue to see their cancer specialist (oncologist) every 3 to 6 months for the first year after cancer treatment.  · At some point, you may want to go back to seeing your family caregiver. You would no longer see your oncologist for regular checkups. Many women do this about 1 year after their first diagnosis of breast cancer.  · You will still need to be seen every so often by your oncologist. Ask how often that should be. Coordinate this with your family or primary caregiver.  · Think about  having genetic counseling. This would provide information on traits that can be passed or inherited from one generation to the next. In some cases, breast cancer runs in families. Tell your caregiver if you:  · Are of Ashkenazi Jewish heritage.  · Have any family member who has had ovarian cancer.  · Have a mother, sister, or daughter who had breast cancer before age 50.  · Have 2 or more close relatives who have had breast cancer. This means a mother, sister, daughter, aunt, or grandmother.  · Had breast cancer in both breasts.  · Have a female relative who has had breast cancer.  ·   Some tests are not recommended for routine screening. Someone recovering from breast cancer does not need to have these tests if there are no problems. The tests have risks, such as radiation exposure, and can be costly. The risks of these tests are thought to be greater than the benefits:  · Blood tests.  · Chest X-rays.  · Bone scans.  · Liver ultrasound.  · Computed tomography (CT scan).  · Positron emission tomography (PET scan).  · Magnetic resonance imaging (MRI scan).  DIAGNOSIS OF RECURRENT CANCER  Recurrent breast cancer may be suspected for various reasons. A mammogram may not look normal. You might feel a lump or have other symptoms. Your caregiver may find something unusual during an exam. To be sure, your caregiver will probably order some tests. The tests are needed because there are symptoms or hints of a problem. They could include:  · Blood tests, including a test to check how well the liver is working. The liver is a common site for a distant cancer recurrence.  · Imaging tests that create pictures of the inside of the body. These tests include:  · Chest X-rays to show if the cancer has come back in the lungs.  · CT scans to create detailed pictures of various areas of the body and help find a distant recurrence.  · MRI scans to find anything unusual in the breast, chest, or lymph nodes.  · Breast ultrasound tests to  examine the breasts.  · Bone scans to create a picture of your whole skeleton and find cancer in bony areas.  · PET scans to create an image of the whole body. PET scans can be used together with CT scans to show more detail.  · Biopsy. A small sample of tissue is taken and checked under a microscope. If cancer cells are found, they may be tested to see if they contain the HER2 gene or the hormones estrogen and progesterone. This will help your caregiver decide how to treat the recurrent cancer.  TREATMENT   How recurrent breast cancer is treated depends on where the new cancer is found. The type of treatment that was used for the first breast cancer makes a difference, too. A combination of treatments may be used. Options include:  · Surgery.  · If the cancer comes back in the breast that was not treated before, you may need a lumpectomy or mastectomy.  · If the cancer comes back in the breast that was treated before, you may need a mastectomy.  · The lymph nodes under the arm may need to be removed.  · Radiation therapy.  · For a local recurrence, radiation may be used if it was not used during the first treatment.  · For a distance recurrence, radiation is sometimes used.  · Chemotherapy.  · This may be used before surgery to treat recurrent breast cancer.  · This may be used to treat recurrent cancer that cannot be treated with surgery.  · This may be used to treat a distant recurrence.  · Hormone therapy.  · Women with the HER2 gene may be given hormone therapy to attack this gene.  Document Released: 04/14/2011 Document Revised: 08/05/2011 Document Reviewed: 04/14/2011  [employer]® Patient Information ©2012 [employer].

## 2011-11-30 NOTE — Anesthesia Preprocedure Evaluation (Addendum)
Anesthesia Evaluation  Patient identified by MRN, date of birth, ID band Patient awake    Reviewed: Allergy & Precautions, H&P , NPO status , Patient's Chart, lab work & pertinent test results  History of Anesthesia Complications (+) PONV  Airway Mallampati: II TM Distance: >3 FB Neck ROM: Full    Dental  (+) Teeth Intact, Dental Advisory Given and Caps   Pulmonary neg pulmonary ROS,  breath sounds clear to auscultation  Pulmonary exam normal       Cardiovascular negative cardio ROS  Rhythm:Regular Rate:Normal     Neuro/Psych  Headaches,    GI/Hepatic negative GI ROS, Neg liver ROS,   Endo/Other  negative endocrine ROS  Renal/GU negative Renal ROS     Musculoskeletal negative musculoskeletal ROS (+)   Abdominal Normal abdominal exam  (+)   Peds  Hematology Breast Cancer   Anesthesia Other Findings   Reproductive/Obstetrics                          Anesthesia Physical Anesthesia Plan  ASA: II  Anesthesia Plan: General   Post-op Pain Management:    Induction: Intravenous  Airway Management Planned: LMA and Oral ETT  Additional Equipment:   Intra-op Plan:   Post-operative Plan: Extubation in OR  Informed Consent: I have reviewed the patients History and Physical, chart, labs and discussed the procedure including the risks, benefits and alternatives for the proposed anesthesia with the patient or authorized representative who has indicated his/her understanding and acceptance.   Dental advisory given  Plan Discussed with: Surgeon and CRNA  Anesthesia Plan Comments:        Anesthesia Quick Evaluation

## 2011-11-30 NOTE — Preoperative (Signed)
Beta Blockers   Reason not to administer Beta Blockers:Not Applicable 

## 2011-11-30 NOTE — Progress Notes (Signed)
Pt. Arrived [facility]- guide wire secured, states she did  fleet enema last night.

## 2011-11-30 NOTE — H&P (Signed)
Chief Complaint:  Advanced right breast cancer with good response to neoadjuvant chemotherapy  History of Present Illness:  Shelly Willis is an 71 y.o. 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  She has responded to neoadjuvant chemotherapy with marked tumor shrinkage.  This has metastasized to the right axillary nodes  Past Medical History  Diagnosis Date  . Cancer     breast  . Cataract   . Hyperlipidemia   . History of neck surgery cervical laminectomy  . Urethral polyp removed AUG 2010  . Stress incontinence, female   . Migraines   . PONV (postoperative nausea and vomiting)   . H/O exercise stress test     done in PCP- office, maybe 15 yrs. ago     Past Surgical History  Procedure Date  . Cholecystectomy 1993  . Tubal ligation 1977  . Abdominal hysterectomy 1997  . Bladder surgery 2011    growth attached to bladder stem   . Bilateral salpingoophorectomy   . Ruptured disc surgery 1988 1988  . Kidney stones     Current Facility-Administered Medications  Medication Dose Route Frequency Provider Last Rate Last Dose  . ceFAZolin (ANCEF) IVPB 1 g/50 mL premix  1 g Intravenous 60 min Pre-Op Valarie Merino, MD      . fentaNYL (SUBLIMAZE) injection 50-100 mcg  50-100 mcg Intravenous PRN Josepha Pigg, MD   75 mcg at 11/30/11 1345  . heparin injection 5,000 Units  5,000 Units Subcutaneous Once Valarie Merino, MD   5,000 Units at 11/30/11 1146  . lactated ringers infusion   Intravenous Continuous Josepha Pigg, MD 50 mL/hr at 11/30/11 1344     Facility-Administered Medications Ordered in Other Encounters  Medication Dose Route Frequency Provider Last Rate Last Dose  . technetium sulfur colloid ([place]COMED-[place]) filtered injection solution 1 milli Curie  1 milli Curie Intradermal Once PRN Medication Radiologist, MD   1 milli Curie at 11/30/11 1352   Vesicare Family History  Problem Relation Age of Onset  .  Anesthesia problems Neg Hx    Social History:   reports that she quit smoking about 24 years ago. She has never used smokeless tobacco. She reports that she drinks about .6 ounces of alcohol per week. She reports that she does not use illicit drugs.   REVIEW OF SYSTEMS - PERTINENT POSITIVES ONLY: Noncontributory   Physical Exam:   Blood pressure 131/81, pulse 91, temperature 97.6 F (36.4 C), temperature source Oral, resp. rate 18, SpO2 94.00%. There is no height or weight on file to calculate BMI.  Gen:  WDWN white female NAD  Neurological: Alert and oriented to person, place, and time. Motor and sensory function is grossly intact  Head: Normocephalic and atraumatic.  Eyes: Conjunctivae are normal. Pupils are equal, round, and reactive to light. No scleral icterus.  Neck: Normal range of motion. Neck supple. No tracheal deviation or thyromegaly present.  Cardiovascular:  SR without murmurs or gallops.  No carotid bruits Respiratory: Effort normal.  No respiratory distress. No chest wall tenderness. Breath sounds normal.  No wheezes, rales or rhonchi.  Abdomen:  nontender GU: Musculoskeletal: Normal range of motion. Extremities are nontender. No cyanosis, edema or clubbing noted Lymphadenopathy: No cervical, preauricular, postauricular or axillary adenopathy is present Skin: Skin is warm and dry. No rash noted. No diaphoresis. No erythema. No pallor. Pscyh: Normal mood and affect. Behavior is normal. Judgment and thought content normal.   LABORATORY RESULTS: No  results found for this or any previous visit (from the past 48 hour(s)).  RADIOLOGY RESULTS: No results found.  Problem List: Patient Active Problem List  Diagnoses  . Malignant neoplasm of upper-outer quadrant of female breast    Assessment & Plan: Right breast cancer  Sentinel node biopsy and right breast cancer    Shelly B. Daphine Deutscher, MD, [facility] [phone]  beeper [phone]  11/30/2011 2:22 PM  There has been no change in the patient's past medical history or physical exam in the past 24 hours to the best of my knowledge. I examined the patient in the holding area and have made any changes to the history and physical exam report that is included above.   Expectations and outcome results have been discussed with the patient to include risks and benefits.  All questions have been answered and we will proceed with previously discussed procedure noted and signed in the consent form in the patient's record.    Shelly Willis BMD FACS 2:26 PM  11/30/2011

## 2011-11-30 NOTE — Anesthesia Postprocedure Evaluation (Signed)
Anesthesia Post Note  Patient: Shelly Willis  Procedure(s) Performed: Procedure(s) (LRB): BREAST LUMPECTOMY WITH NEEDLE LOCALIZATION AND AXILLARY SENTINEL LYMPH NODE BX (Right)  Anesthesia type: general  Patient location: PACU  Post pain: Pain level controlled  Post assessment: Patient's Cardiovascular Status Stable  Last Vitals:  Filed Vitals:   11/30/11 1755  BP: 152/68  Pulse: 99  Temp:   Resp: 20    Post vital signs: Reviewed and stable  Level of consciousness: sedated  Complications: No apparent anesthesia complications

## 2011-11-30 NOTE — Anesthesia Procedure Notes (Signed)
Procedure Name: LMA Insertion Date/Time: 11/30/2011 2:35 PM Performed by: Delbert Harness Pre-anesthesia Checklist: Patient identified, Emergency Drugs available, Suction available, Patient being monitored and Timeout performed Patient Re-evaluated:Patient Re-evaluated prior to inductionOxygen Delivery Method: Circle system utilized Preoxygenation: Pre-oxygenation with 100% oxygen Intubation Type: IV induction LMA: LMA inserted LMA Size: 4.0 Number of attempts: 1 Placement Confirmation: positive ETCO2 and breath sounds checked- equal and bilateral Tube secured with: Tape Dental Injury: Teeth and Oropharynx as per pre-operative assessment

## 2011-12-01 ENCOUNTER — Telehealth (INDEPENDENT_AMBULATORY_CARE_PROVIDER_SITE_OTHER): Payer: Self-pay | Admitting: Surgery

## 2011-12-01 HISTORY — PX: BREAST SURGERY: SHX581

## 2011-12-03 NOTE — Telephone Encounter (Signed)
Notified the patients husband Scheduled the patient for post op visit 12/10/11 pt also to have staples removed.

## 2011-12-09 ENCOUNTER — Encounter (INDEPENDENT_AMBULATORY_CARE_PROVIDER_SITE_OTHER): Payer: Self-pay | Admitting: Surgery

## 2011-12-09 ENCOUNTER — Ambulatory Visit (INDEPENDENT_AMBULATORY_CARE_PROVIDER_SITE_OTHER): Payer: Medicare Other | Admitting: Surgery

## 2011-12-09 VITALS — BP 150/92 | HR 84 | Temp 97.0°F | Resp 16

## 2011-12-09 DIAGNOSIS — Z9889 Other specified postprocedural states: Secondary | ICD-10-CM

## 2011-12-09 NOTE — Progress Notes (Signed)
Shelly Willis 71 y.o.  There is no height or weight on file to calculate BMI.  Patient Active Problem List  Diagnoses  . 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 Vesicare (Solifenacin Succinate)     Headaches and upset stomach     Past Surgical History  Procedure Date  . Cholecystectomy 1993  . Tubal ligation 1977  . Abdominal hysterectomy 1997  . Bladder surgery 2011    growth attached to bladder stem   . Bilateral salpingoophorectomy   . Ruptured disc surgery 1988 1988  . Kidney stones   . Breast surgery 11/2011    Right breast   HAWKINS,EDWARD L, MD, MD No diagnosis found.  Mr. and [name] came in for a first postop visit.  She is 9 days out.  This morning she had spontaneous drainage of some seroma fluid from her axillary wound. This looks like it is intact and is not currently leaking. There is no redness. She is not having a pain. I removed the staples from her lumpectomy site. I explained the extent of the surgery as the lumpectomy was wide and posteriorly came off the chest wall. 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 The axilla proved to be very stuck and this had both positive nodes as well as extranodal cancer.  I explained that I would recommend mastectomy with more extensive axillary dissection. I will be out of meeting next week when she is likely to come back up at breast conference. I will be in touch with Dr. Darnelle Willis who will be seeing her on the 29th.  Upon my return the following week I will see her and we can discuss treatment plans. Matt B. Daphine Deutscher, MD, [facility] [phone] beeper [phone]  12/09/2011 12:14 PM

## 2011-12-19 ENCOUNTER — Other Ambulatory Visit: Payer: Self-pay | Admitting: Oncology

## 2011-12-20 ENCOUNTER — Other Ambulatory Visit (HOSPITAL_BASED_OUTPATIENT_CLINIC_OR_DEPARTMENT_OTHER): Payer: Medicare Other | Admitting: Lab

## 2011-12-20 ENCOUNTER — Ambulatory Visit (HOSPITAL_COMMUNITY)
Admission: RE | Admit: 2011-12-20 | Discharge: 2011-12-20 | Disposition: A | Discharge status: Home / Self Care | Payer: Medicare Other | Source: Ambulatory Visit | Attending: Oncology | Admitting: Oncology

## 2011-12-20 DIAGNOSIS — C50419 Malignant neoplasm of upper-outer quadrant of unspecified female breast: Secondary | ICD-10-CM

## 2011-12-20 DIAGNOSIS — M5124 Other intervertebral disc displacement, thoracic region: Secondary | ICD-10-CM | POA: Insufficient documentation

## 2011-12-20 DIAGNOSIS — M899 Disorder of bone, unspecified: Secondary | ICD-10-CM | POA: Insufficient documentation

## 2011-12-20 DIAGNOSIS — M546 Pain in thoracic spine: Secondary | ICD-10-CM | POA: Insufficient documentation

## 2011-12-20 LAB — COMPREHENSIVE METABOLIC PANEL
ALT: 16 U/L (ref 0–35)
AST: 16 U/L (ref 0–37)
Albumin: 4.4 g/dL (ref 3.5–5.2)
CO2: 27 mEq/L (ref 19–32)
Calcium: 10.8 mg/dL — ABNORMAL HIGH (ref 8.4–10.5)
Chloride: 106 mEq/L (ref 96–112)
Creatinine, Ser: 0.92 mg/dL (ref 0.50–1.10)
Potassium: 5.1 mEq/L (ref 3.5–5.3)
Sodium: 142 mEq/L (ref 135–145)
Total Protein: 7 g/dL (ref 6.0–8.3)

## 2011-12-20 LAB — CBC WITH DIFFERENTIAL/PLATELET
Eosinophils Absolute: 0.2 10*3/uL (ref 0.0–0.5)
HCT: 39 % (ref 34.8–46.6)
LYMPH%: 34.8 % (ref 14.0–49.7)
MONO#: 0.8 10*3/uL (ref 0.1–0.9)
NEUT#: 4.2 10*3/uL (ref 1.5–6.5)
Platelets: 352 10*3/uL (ref 145–400)
RBC: 4.15 10*6/uL (ref 3.70–5.45)
WBC: 8 10*3/uL (ref 3.9–10.3)
lymph#: 2.8 10*3/uL (ref 0.9–3.3)
nRBC: 0 % (ref 0–0)

## 2011-12-20 LAB — CANCER ANTIGEN 27.29: CA 27.29: 53 U/mL — ABNORMAL HIGH (ref 0–39)

## 2011-12-20 MED ORDER — GADOBENATE DIMEGLUMINE 529 MG/ML IV SOLN
15.0000 mL | Freq: Once | INTRAVENOUS | Status: AC | PRN
  Administered 2011-12-20: 15 mL via INTRAVENOUS

## 2011-12-22 ENCOUNTER — Ambulatory Visit (INDEPENDENT_AMBULATORY_CARE_PROVIDER_SITE_OTHER): Payer: Medicare Other | Admitting: Surgery

## 2011-12-22 ENCOUNTER — Encounter (INDEPENDENT_AMBULATORY_CARE_PROVIDER_SITE_OTHER): Payer: Self-pay | Admitting: Surgery

## 2011-12-22 VITALS — BP 150/76 | HR 95 | Temp 97.6°F | Ht 65.0 in | Wt 163.0 lb

## 2011-12-22 DIAGNOSIS — Z9889 Other specified postprocedural states: Secondary | ICD-10-CM

## 2011-12-22 NOTE — Progress Notes (Signed)
1. Lymph nodes, regional resection, Axillary right side - THREE LYMPH NODES POSITIVE FOR METASTATIC LOBULAR CARCINOMA (3/3). - FOUR ADDITIONAL SOFT TISSUE DEPOSITS (CANCEROUS NODULES) OF LOBULAR CARCINOMA. 2. Breast, lumpectomy, Right - INVASIVE GRADE I LOBULAR CARCINOMA, SPANNING 3.4 CM. - DEEP MARGIN IS FOCALLY POSITIVE. - ONE LYMPH NODE POSITIVE FOR METASTATIC LOBULAR CARCINOMA (1/1). - SEE ONCOLOGY TEMPLATE AND COMMENT. 3. Breast, excision, Inferolateral margin right - INVASIVE GRADE I LOBULAR CARCINOMA, 0.4 CM FOCUS. - MARGIN IS NEGATIVE. - ATYPICAL DUCTAL HYPERPLASIA PRESENT. - FIBROCYSTIC CHANGES IDENTIFIED.  Needs right modified mastectomy.  She wants at [facility] with overnight obs.    Chief Complaint:  Post right lumpectomy with positive margins on chest wall  History of Present Illness:  Shelly Willis is an 71 y.o. female who underwent neoadjuvant chemotherapy followed by a lumpectomy and axillary sampling has positive margins posteriorly on her muscle wall and also a separate focus of lobular cancer well away from her primary. In addition her axilla showed extranodal disease. I have discussed mastectomy with further axillary sampling with her husband and she was proceed. She elected this done at kind of surgery.  Past Medical History  Diagnosis Date  . Cancer     breast  . Cataract   . Hyperlipidemia   . History of neck surgery cervical laminectomy  . Urethral polyp removed AUG 2010  . Stress incontinence, female   . Migraines   . PONV (postoperative nausea and vomiting)   . H/O exercise stress test     done in PCP- office, maybe 15 yrs. ago     Past Surgical History  Procedure Date  . Cholecystectomy 1993  . Tubal ligation 1977  . Abdominal hysterectomy 1997  . Bladder surgery 2011    growth attached to bladder stem   . Bilateral salpingoophorectomy   . Ruptured disc surgery 1988 1988  . Kidney stones   . Breast surgery 11/2011    Right breast     Current Outpatient Prescriptions  Medication Sig Dispense Refill  . calcium citrate-vitamin D 200-200 MG-UNIT TABS Take 1 tablet by mouth 2 (two) times daily.       Marland Kitchen letrozole (FEMARA) 2.5 MG tablet Take 2.5 mg by mouth daily.       Marland Kitchen ROXICET 5-325 MG/5ML solution       . gabapentin (NEURONTIN) 300 MG capsule Take 300 mg by mouth at bedtime.      Marland Kitchen DISCONTD: estrogens, conjugated, (PREMARIN) 1.25 MG tablet Take 1.25 mg by mouth daily.        [facility] Family History  Problem Relation Age of Onset  . Anesthesia problems Neg Hx    Social History:   reports that she quit smoking about 24 years ago. She has never used smokeless tobacco. She reports that she drinks about .6 ounces of alcohol per week. She reports that she does not use illicit drugs.   REVIEW OF SYSTEMS - PERTINENT POSITIVES ONLY: See old chart  Physical Exam:   Blood pressure 150/76, pulse 95, temperature 97.6 F (36.4 C), temperature source Temporal, height 5\' 5"  (1.651 m), weight 163 lb (73.936 kg), SpO2 94.00%. Body mass index is 27.12 kg/(m^2).  Gen:  WDWN WF NAD  Neurological: Alert and oriented to person, place, and time. Motor and sensory function is grossly intact  Head: Normocephalic and atraumatic.  Eyes: Conjunctivae are normal. Pupils are equal, round, and reactive to light. No scleral icterus.  Neck: Normal range of motion. Neck supple. No tracheal deviation  or thyromegaly present.  Cardiovascular:  SR without murmurs or gallops.  No carotid bruits Respiratory: Effort normal.  No respiratory distress. No chest wall tenderness. Breath sounds normal.  No wheezes, rales or rhonchi.  Abdomen:  nontender GU: Musculoskeletal: Normal range of motion. Extremities are nontender. No cyanosis, edema or clubbing noted Lymphadenopathy: No cervical, preauricular, postauricular or axillary adenopathy is present Skin: Skin is warm and dry. No rash noted. No diaphoresis. No erythema. No pallor. Pscyh: Normal mood and  affect. Behavior is normal. Judgment and thought content normal.  Breast: Right incision healing. Some discomfort without redness beneath the right axilla. Patient is 3 weeks out from surgery.  LABORATORY RESULTS: No results found for this or any previous visit (from the past 48 hour(s)).  RADIOLOGY RESULTS: No results found.  Problem List: Patient Active Problem List  Diagnoses  . Malignant neoplasm of upper-outer quadrant of female breast  . Status post right breast lumpectomy    Assessment & Plan: Patient is scheduled to see Dr. Darnelle Catalan  on the 29th.  I reviewed her MR scan in the T. 8 vertebra seems to be stable. Will proceed with scheduling right modified mastectomy.    Matt B. Daphine Deutscher, MD, [facility] [phone] beeper [phone]  12/22/2011 1:37 PM

## 2011-12-23 NOTE — Progress Notes (Signed)
No labs needed-had labs cancer center 12/20/11-had rt axillary node dissection 11/30/11 and rt lump-did well- no n/v-but usually does have n/v with anesth. To bring meds and overnight bag

## 2011-12-27 ENCOUNTER — Telehealth: Payer: Self-pay | Admitting: Oncology

## 2011-12-27 ENCOUNTER — Ambulatory Visit (HOSPITAL_BASED_OUTPATIENT_CLINIC_OR_DEPARTMENT_OTHER): Payer: Medicare Other | Admitting: Oncology

## 2011-12-27 VITALS — BP 174/81 | HR 91 | Temp 97.9°F | Ht 65.0 in | Wt 161.3 lb

## 2011-12-27 DIAGNOSIS — Z79811 Long term (current) use of aromatase inhibitors: Secondary | ICD-10-CM

## 2011-12-27 DIAGNOSIS — C50419 Malignant neoplasm of upper-outer quadrant of unspecified female breast: Secondary | ICD-10-CM

## 2011-12-27 DIAGNOSIS — Z170 Estrogen receptor positive status [ER+]: Secondary | ICD-10-CM

## 2011-12-27 NOTE — Telephone Encounter (Signed)
gve the pt her may 2013 appt calendar 

## 2011-12-27 NOTE — Progress Notes (Signed)
ID: Earnestine Leys  HPI:  The patient had routine screening mammography 04/21/2011. This showed a possible distortion in the right breast. The patient was brought back for additional views October 3 which confirmed the area of distortion. Ultrasound showed a 1.8 cm suspicious area and this was biopsied October 17, showing [facility] [id number]) an invasive mammary carcinoma with lobular features. This was strongly estrogen and progesterone receptor positive, HER-2 negative, with an elevated MIB-1. The patient was then referred to Dr. Wenda Low and bilateral breast MRIs were obtained October 22 confirming a 2.4 cm mass in the upper right breast at the 12:00 position. There wwas some right nipple retraction and skin thickening but this is felt to be secondary to post biopsy change. There were no other suspicious areas of concern. Specifically there was no evidence of axillary or internal mammary adenopathy. She was treated neoadjuvant [place] letrozole, with her further breast cancer history as detailed below.     Interval History: Shayanna returns today with her husband [name] for followup of her breast cancer. Since the last visit here she had a needle core biopsy of the left breast, 9:00 position, and also an axillary lymph node biopsy. The breast site was negative, but the lymph node was positive. The prognostic profile again showed a a strongly estrogen and progesterone receptor positive, progesterone receptor positive (10%) invasive ductal carcinoma, with a low proliferation marker at 5% and no HER-2 amplification. Dr. Nicolette Bang is planning mastectomy and axillary lymph node dissection tomorrow.  ROS: She is having pain in the right axilla from her prior procedures. She understands this is post surgical and in does not in itself indicated growth of her cancer. This is controlled with what sounds like a liquid oxycodone preparation prescribed by Dr. Daphine Deutscher. This is not constipating her or confusing her. On the  other hand she is not as active as she used to be. [name] tells me she mostly sat around it during the day. She is able to walk to the her mailbox and back, and she does this she says 20 times daily. I have strongly encouraged her to try to get back to normal activities as much as possible. Of course she will have to get clearance from Dr. Daphine Deutscher as to what she can and cannot do after her upcoming surgery.  Past Medical History   Diagnosis  Date   .  Cancer      breast   .  Nasal congestion    .  Sore throat    .  Cataract    .  Hyperlipidemia     Past Surgical History   Procedure  Date   .  Cholecystectomy  1993   .  Tubal ligation  1977   .  Back surgery  1987     ruptured disc   .  Abdominal hysterectomy  1997   .  Bladder surgery  2011     growth attached to bladder stem    Family history: The patient's father died in an automobile accident at the age of 19. The patient's mother died in her mid-80s status post stroke the patient has one brother and one sister in fair health there is no history of breast or ovarian cancer in the family   Gynecologic history: Menarche age 27 hysterectomy age 50 the patient took hormone replacement approximately 20 years. 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 She has never used smokeless tobacco. She reports that she drinks alcohol. She reports that she does not use illicit drugs.  Cholesterol treated with diet  Bone density normal bone density 2005  Colonoscopy  last colonoscopy 2003  (PAP) patient no longer has Pap smears being status post hysterectomy   Medications: I have reviewed the patient's current medications.  Current Outpatient Prescriptions  Medication Sig Dispense Refill  . calcium citrate-vitamin D 200-200 MG-UNIT TABS Take 1 tablet by mouth daily.        Marland Kitchen letrozole (FEMARA) 2.5 MG tablet Take 2.5 mg by mouth daily.        .           Objective: Middle-aged white woman in no acute distress  Filed Vitals:   12/27/11 1006  BP: 174/81  Pulse: 91  Temp: 97.9 F (36.6 C)    Physical Exam:    Sclerae unicteric  Oropharynx clear  No peripheral adenopathy, the right axilla specifically being unremarkable  Lungs clear -- no rales or rhonchi  Heart regular rate and rhythm  Abdomen benign  MSK no focal spinal tenderness, no peripheral edema  Neuro nonfocal  Breast exam: I do not palpate any mass in the right breast.  The left breast is unremarkable.   Lab Results: CMP      Component Value Date/Time   NA 142 12/20/2011 0803   K 5.1 12/20/2011 0803   CL 106 12/20/2011 0803   CO2 27 12/20/2011 0803   GLUCOSE 97 12/20/2011 0803   BUN 17 12/20/2011 0803   CREATININE 0.92 12/20/2011 0803   CALCIUM 10.8* 12/20/2011 0803   PROT 7.0 12/20/2011 0803   ALBUMIN 4.4 12/20/2011 0803   AST 16 12/20/2011 0803   ALT 16 12/20/2011 0803   ALKPHOS 121* 12/20/2011 0803   BILITOT 0.3 12/20/2011 0803   GFRNONAA 62* 11/25/2011 0927   GFRAA 72* 11/25/2011 0927    CBC Lab Results  Component Value Date   WBC 8.0 12/20/2011   HGB 13.0 12/20/2011   HCT 39.0 12/20/2011   MCV 94.1 12/20/2011   PLT 352 12/20/2011   NEUTROABS 4.2 12/20/2011    Studies/Results: No new studies found  Assessment: 71 year-old [place] woman, status post right breast biopsy October of 2012 for what now clinically appears to be a T2 N2 MX  invasive lobular breast cancer, estrogen and progesterone receptor positive, HER-2 not amplified, with an elevated MIB-1-1, on letrozole  since the first week in November, with evidence of response.   Plan: She is scheduled for right modified radical mastectomy tomorrow. We talked about some pain issues related to that, and some exercise clearance. She will benefit once she recovers from referral to the lymphedema clinic. She tells me she will be interested in reconstruction. In any case the plan from our point of view is to continue letrozole. She may need radiation depending on the number of lymph nodes and we will review the path results with Dr. Mitzi Hansen as they come in. Otherwise she will see me again in one month. She knows  to call for any problems that may develop before that visit.   Macon Sandiford C 12/27/2011

## 2011-12-28 ENCOUNTER — Encounter (HOSPITAL_BASED_OUTPATIENT_CLINIC_OR_DEPARTMENT_OTHER): Payer: Self-pay | Admitting: Anesthesiology

## 2011-12-28 ENCOUNTER — Ambulatory Visit (HOSPITAL_BASED_OUTPATIENT_CLINIC_OR_DEPARTMENT_OTHER): Payer: Medicare Other | Admitting: Anesthesiology

## 2011-12-28 ENCOUNTER — Ambulatory Visit (HOSPITAL_BASED_OUTPATIENT_CLINIC_OR_DEPARTMENT_OTHER)
Admission: RE | Admit: 2011-12-28 | Discharge: 2011-12-29 | Disposition: A | Discharge status: Home / Self Care | Payer: Medicare Other | Source: Ambulatory Visit | Attending: Surgery | Admitting: Surgery

## 2011-12-28 ENCOUNTER — Encounter (HOSPITAL_BASED_OUTPATIENT_CLINIC_OR_DEPARTMENT_OTHER)
Admission: RE | Disposition: A | Discharge status: Home / Self Care | Payer: Self-pay | Source: Ambulatory Visit | Attending: Surgery

## 2011-12-28 DIAGNOSIS — C50919 Malignant neoplasm of unspecified site of unspecified female breast: Secondary | ICD-10-CM

## 2011-12-28 DIAGNOSIS — C50419 Malignant neoplasm of upper-outer quadrant of unspecified female breast: Secondary | ICD-10-CM | POA: Insufficient documentation

## 2011-12-28 DIAGNOSIS — G43909 Migraine, unspecified, not intractable, without status migrainosus: Secondary | ICD-10-CM | POA: Insufficient documentation

## 2011-12-28 DIAGNOSIS — N393 Stress incontinence (female) (male): Secondary | ICD-10-CM | POA: Insufficient documentation

## 2011-12-28 DIAGNOSIS — C773 Secondary and unspecified malignant neoplasm of axilla and upper limb lymph nodes: Secondary | ICD-10-CM | POA: Insufficient documentation

## 2011-12-28 DIAGNOSIS — E785 Hyperlipidemia, unspecified: Secondary | ICD-10-CM | POA: Insufficient documentation

## 2011-12-28 HISTORY — PX: MODIFIED MASTECTOMY: SHX5268

## 2011-12-28 SURGERY — MODIFIED MASTECTOMY
Anesthesia: General | Site: Breast | Laterality: Right | Wound class: Clean

## 2011-12-28 MED ORDER — HYDROMORPHONE HCL PF 1 MG/ML IJ SOLN: 0.2500 mg | INTRAMUSCULAR | Status: DC | PRN

## 2011-12-28 MED ORDER — GABAPENTIN 300 MG PO CAPS: 300.0000 mg | ORAL_CAPSULE | Freq: Every day | ORAL | Status: DC

## 2011-12-28 MED ORDER — EPHEDRINE SULFATE 50 MG/ML IJ SOLN
INTRAMUSCULAR | Status: DC | PRN
  Administered 2011-12-28: 5 mg via INTRAVENOUS

## 2011-12-28 MED ORDER — ACETAMINOPHEN 325 MG PO TABS: 325.0000 mg | ORAL_TABLET | ORAL | Status: DC | PRN

## 2011-12-28 MED ORDER — KCL IN DEXTROSE-NACL 20-5-0.45 MEQ/L-%-% IV SOLN
INTRAVENOUS | Status: DC
  Administered 2011-12-28: 14:00:00 via INTRAVENOUS

## 2011-12-28 MED ORDER — SCOPOLAMINE 1 MG/3DAYS TD PT72
1.0000 | MEDICATED_PATCH | TRANSDERMAL | Status: DC
  Administered 2011-12-28: 1 via TRANSDERMAL

## 2011-12-28 MED ORDER — LETROZOLE 2.5 MG PO TABS
2.5000 mg | ORAL_TABLET | Freq: Every day | ORAL | Status: DC
  Administered 2011-12-28: 2.5 mg via ORAL

## 2011-12-28 MED ORDER — MEPERIDINE HCL 25 MG/ML IJ SOLN: 6.2500 mg | INTRAMUSCULAR | Status: DC | PRN

## 2011-12-28 MED ORDER — CEFAZOLIN SODIUM 1-5 GM-% IV SOLN
1.0000 g | INTRAVENOUS | Status: AC
  Administered 2011-12-28: 1 g via INTRAVENOUS

## 2011-12-28 MED ORDER — ONDANSETRON HCL 4 MG/2ML IJ SOLN: 4.0000 mg | Freq: Once | INTRAMUSCULAR | Status: DC | PRN

## 2011-12-28 MED ORDER — MORPHINE SULFATE 2 MG/ML IJ SOLN: 1.0000 mg | INTRAMUSCULAR | Status: DC | PRN

## 2011-12-28 MED ORDER — LIDOCAINE HCL (CARDIAC) 20 MG/ML IV SOLN
INTRAVENOUS | Status: DC | PRN
  Administered 2011-12-28: 80 mg via INTRAVENOUS

## 2011-12-28 MED ORDER — HEPARIN SODIUM (PORCINE) 5000 UNIT/ML IJ SOLN
5000.0000 [IU] | Freq: Once | INTRAMUSCULAR | Status: AC
  Administered 2011-12-28: 5000 [IU] via SUBCUTANEOUS

## 2011-12-28 MED ORDER — PROPOFOL 10 MG/ML IV EMUL
INTRAVENOUS | Status: DC | PRN
  Administered 2011-12-28: 150 mg via INTRAVENOUS

## 2011-12-28 MED ORDER — OXYCODONE-ACETAMINOPHEN 5-325 MG/5ML PO SOLN
7.5000 mL | ORAL | Status: DC | PRN
  Administered 2011-12-28: 7.5 mL via ORAL

## 2011-12-28 MED ORDER — HEPARIN SODIUM (PORCINE) 5000 UNIT/ML IJ SOLN
5000.0000 [IU] | Freq: Three times a day (TID) | INTRAMUSCULAR | Status: DC
  Administered 2011-12-28 – 2011-12-29 (×3): 5000 [IU] via SUBCUTANEOUS

## 2011-12-28 MED ORDER — FENTANYL CITRATE 0.05 MG/ML IJ SOLN
INTRAMUSCULAR | Status: DC | PRN
  Administered 2011-12-28 (×2): 25 ug via INTRAVENOUS
  Administered 2011-12-28: 100 ug via INTRAVENOUS
  Administered 2011-12-28 (×4): 25 ug via INTRAVENOUS

## 2011-12-28 MED ORDER — LACTATED RINGERS IV SOLN
INTRAVENOUS | Status: DC
  Administered 2011-12-28 (×2): via INTRAVENOUS

## 2011-12-28 MED ORDER — MIDAZOLAM HCL 5 MG/5ML IJ SOLN
INTRAMUSCULAR | Status: DC | PRN
  Administered 2011-12-28: 1 mg via INTRAVENOUS

## 2011-12-28 MED ORDER — DEXAMETHASONE SODIUM PHOSPHATE 4 MG/ML IJ SOLN
INTRAMUSCULAR | Status: DC | PRN
  Administered 2011-12-28: 10 mg via INTRAVENOUS

## 2011-12-28 MED ORDER — BUPIVACAINE-EPINEPHRINE 0.5% -1:200000 IJ SOLN
INTRAMUSCULAR | Status: DC | PRN
  Administered 2011-12-28: 10 mL

## 2011-12-28 MED ORDER — ONDANSETRON HCL 4 MG/2ML IJ SOLN
INTRAMUSCULAR | Status: DC | PRN
  Administered 2011-12-28: 4 mg via INTRAVENOUS

## 2011-12-28 SURGICAL SUPPLY — 51 items
APL SKNCLS STERI-STRIP NONHPOA (GAUZE/BANDAGES/DRESSINGS) ×1
APPLIER CLIP 11 MED OPEN (CLIP)
APPLIER CLIP 9.375 MED OPEN (MISCELLANEOUS)
APR CLP MED 11 20 MLT OPN (CLIP)
APR CLP MED 9.3 20 MLT OPN (MISCELLANEOUS)
BENZOIN TINCTURE PRP APPL 2/3 (GAUZE/BANDAGES/DRESSINGS) ×2 IMPLANT
BINDER BREAST LRG (GAUZE/BANDAGES/DRESSINGS) ×1 IMPLANT
BLADE HEX COATED 2.75 (ELECTRODE) ×2 IMPLANT
BLADE SURG 10 STRL SS (BLADE) ×2 IMPLANT
BLADE SURG 15 STRL LF DISP TIS (BLADE) ×1 IMPLANT
BLADE SURG 15 STRL SS (BLADE) ×2
CANISTER SUCTION 1200CC (MISCELLANEOUS) ×2 IMPLANT
CLIP APPLIE 11 MED OPEN (CLIP) IMPLANT
CLIP APPLIE 9.375 MED OPEN (MISCELLANEOUS) IMPLANT
CLOTH BEACON ORANGE TIMEOUT ST (SAFETY) ×2 IMPLANT
COVER MAYO STAND STRL (DRAPES) ×2 IMPLANT
COVER TABLE BACK 60X90 (DRAPES) ×2 IMPLANT
DRAIN CHANNEL 19F RND (DRAIN) ×2 IMPLANT
DRAPE LAPAROSCOPIC ABDOMINAL (DRAPES) ×2 IMPLANT
ELECT REM PT RETURN 9FT ADLT (ELECTROSURGICAL) ×2
ELECTRODE REM PT RTRN 9FT ADLT (ELECTROSURGICAL) ×1 IMPLANT
EVACUATOR SILICONE 100CC (DRAIN) ×2 IMPLANT
GAUZE SPONGE 4X4 12PLY STRL LF (GAUZE/BANDAGES/DRESSINGS) IMPLANT
GAUZE XEROFORM 1X8 LF (GAUZE/BANDAGES/DRESSINGS) ×1 IMPLANT
GLOVE BIO SURGEON STRL SZ8 (GLOVE) ×2 IMPLANT
GLOVE BIOGEL M 6.5 STRL (GLOVE) ×2 IMPLANT
GLOVE BIOGEL M 7.0 STRL (GLOVE) ×1 IMPLANT
GOWN BRE IMP SLV AUR LG STRL (GOWN DISPOSABLE) ×2 IMPLANT
GOWN PREVENTION PLUS XLARGE (GOWN DISPOSABLE) ×2 IMPLANT
NDL HYPO 25X1 1.5 SAFETY (NEEDLE) ×2 IMPLANT
NEEDLE HYPO 25X1 1.5 SAFETY (NEEDLE) ×2 IMPLANT
NS IRRIG 1000ML POUR BTL (IV SOLUTION) ×2 IMPLANT
PACK BASIN DAY SURGERY FS (CUSTOM PROCEDURE TRAY) ×2 IMPLANT
PENCIL BUTTON HOLSTER BLD 10FT (ELECTRODE) ×2 IMPLANT
PIN SAFETY STERILE (MISCELLANEOUS) ×2 IMPLANT
SLEEVE SCD COMPRESS KNEE MED (MISCELLANEOUS) ×1 IMPLANT
SPONGE GAUZE 4X4 12PLY (GAUZE/BANDAGES/DRESSINGS) ×1 IMPLANT
SPONGE LAP 18X18 X RAY DECT (DISPOSABLE) ×3 IMPLANT
STRIP CLOSURE SKIN 1/2X4 (GAUZE/BANDAGES/DRESSINGS) ×2 IMPLANT
SUT ETHILON 3 0 PS 1 (SUTURE) ×1 IMPLANT
SUT MON AB 4-0 PC3 18 (SUTURE) IMPLANT
SUT SILK 2 0 SH (SUTURE) ×1 IMPLANT
SUT VIC AB 3-0 SH 27 (SUTURE)
SUT VIC AB 3-0 SH 27X BRD (SUTURE) ×1 IMPLANT
SUT VICRYL 3-0 CR8 SH (SUTURE) ×2 IMPLANT
SYR CONTROL 10ML LL (SYRINGE) ×3 IMPLANT
TAPE CLOTH SURG 4X10 WHT LF (GAUZE/BANDAGES/DRESSINGS) ×1 IMPLANT
TOWEL OR 17X24 6PK STRL BLUE (TOWEL DISPOSABLE) ×4 IMPLANT
TRAY DSU PREP LF (CUSTOM PROCEDURE TRAY) ×2 IMPLANT
TUBE CONNECTING 20X1/4 (TUBING) ×2 IMPLANT
YANKAUER SUCT BULB TIP NO VENT (SUCTIONS) ×2 IMPLANT

## 2011-12-28 NOTE — Anesthesia Postprocedure Evaluation (Signed)
  Anesthesia Post-op Note  Patient: Shelly Willis  Procedure(s) Performed: Procedure(s) (LRB): MODIFIED MASTECTOMY (Right)  Patient Location: PACU  Anesthesia Type: General  Level of Consciousness: awake, alert  and oriented  Airway and Oxygen Therapy: Patient Spontanous Breathing and Patient connected to face mask oxygen  Post-op Pain: mild  Post-op Assessment: Post-op Vital signs reviewed  Post-op Vital Signs: Reviewed  Complications: No apparent anesthesia complications

## 2011-12-28 NOTE — H&P (Signed)
Chief Complaint:  Multifocal advance lobular carcinoma of the right breast, status post lumpectomy and sentinel node biopsy  History of Present Illness:  Shelly Willis is an 71 y.o. female who underwent neoadjuvant chemotherapy for clinically advanced right breast cancer with good response.  However, at the time of her lumpectomy and SNBx she was found to have positive margins posteriorally on the chest wall and multifocal disease as well as matted extranodal axillary disease.  We have discussed right mastectomy and further node removal and will proceed with that today.    Past Medical History  Diagnosis Date  . Cancer     breast  . Cataract   . Hyperlipidemia   . History of neck surgery cervical laminectomy  . Urethral polyp removed AUG 2010  . Stress incontinence, female   . Migraines   . PONV (postoperative nausea and vomiting)   . H/O exercise stress test     done in PCP- office, maybe 15 yrs. ago     Past Surgical History  Procedure Date  . Cholecystectomy 1993  . Tubal ligation 1977  . Abdominal hysterectomy 1997  . Bladder surgery 2011    growth attached to bladder stem   . Bilateral salpingoophorectomy   . Ruptured disc surgery 1988 1988  . Kidney stones   . Breast surgery 11/2011    Right breast    Current Facility-Administered Medications  Medication Dose Route Frequency Provider Last Rate Last Dose  . ceFAZolin (ANCEF) IVPB 1 g/50 mL premix  1 g Intravenous 60 min Pre-Op Valarie Merino, MD      . heparin injection 5,000 Units  5,000 Units Subcutaneous Once Valarie Merino, MD      . lactated ringers infusion   Intravenous Continuous Zenon Mayo, MD       [facility] Family History  Problem Relation Age of Onset  . Anesthesia problems Neg Hx    Social History:   reports that she quit smoking about 24 years ago. She has never used smokeless tobacco. She reports that she drinks about .6 ounces of alcohol per week. She reports that she does not use  illicit drugs.   REVIEW OF SYSTEMS - PERTINENT POSITIVES ONLY: noncontributory  Physical Exam:   Blood pressure 138/88, pulse 82, temperature 97.5 F (36.4 C), temperature source Oral, SpO2 97.00%. There is no height or weight on file to calculate BMI.  Gen:  WDWN WF NAD  Neurological: Alert and oriented to person, place, and time. Motor and sensory function is grossly intact  Head: Normocephalic and atraumatic.  Eyes: Conjunctivae are normal. Pupils are equal, round, and reactive to light. No scleral icterus.  Neck: Normal range of motion. Neck supple. No tracheal deviation or thyromegaly present.  Breast:The right breast has some seroma formation but the skin is healing.   Cardiovascular:  SR without murmurs or gallops.  No carotid bruits Respiratory: Effort normal.  No respiratory distress. No chest wall tenderness. Breath sounds normal.  No wheezes, rales or rhonchi.  Abdomen:  nontender GU: Musculoskeletal: Normal range of motion. Extremities are nontender. No cyanosis, edema or clubbing noted Lymphadenopathy: No cervical, preauricular, postauricular or axillary adenopathy is present Skin: Skin is warm and dry. No rash noted. No diaphoresis. No erythema. No pallor. Pscyh: Normal mood and affect. Behavior is normal. Judgment and thought content normal.   LABORATORY RESULTS: No results found for this or any previous visit (from the past 48 hour(s)).  RADIOLOGY RESULTS: No results found.  Problem List:  Patient Active Problem List  Diagnoses  . Malignant neoplasm of upper-outer quadrant of female breast  . Status post right breast lumpectomy    Assessment & Plan: Residual cancer in the right breast;  Plan right modified mastectomy.  The patient understands the indications and risks of complications.      Matt B. Daphine Deutscher, MD, [facility] [phone] beeper [phone]  12/28/2011 9:07 AM

## 2011-12-28 NOTE — Anesthesia Procedure Notes (Signed)
Procedure Name: LMA Insertion Date/Time: 12/28/2011 9:41 AM Performed by: Burna Cash Pre-anesthesia Checklist: Patient identified, Emergency Drugs available, Suction available and Patient being monitored Patient Re-evaluated:Patient Re-evaluated prior to inductionOxygen Delivery Method: Circle System Utilized Preoxygenation: Pre-oxygenation with 100% oxygen Intubation Type: IV induction Ventilation: Mask ventilation without difficulty LMA: LMA inserted LMA Size: 4.0 Number of attempts: 1 Airway Equipment and Method: bite block Placement Confirmation: positive ETCO2 Tube secured with: Tape Dental Injury: Teeth and Oropharynx as per pre-operative assessment

## 2011-12-28 NOTE — Transfer of Care (Signed)
Immediate Anesthesia Transfer of Care Note  Patient: Shelly Willis  Procedure(s) Performed: Procedure(s) (LRB): MODIFIED MASTECTOMY (Right)  Patient Location: PACU  Anesthesia Type: General  Level of Consciousness: sedated  Airway & Oxygen Therapy: Patient Spontanous Breathing and Patient connected to face mask oxygen  Post-op Assessment: Report given to PACU RN and Post -op Vital signs reviewed and stable  Post vital signs: Reviewed and stable  Complications: No apparent anesthesia complications

## 2011-12-28 NOTE — Brief Op Note (Signed)
12/28/2011  11:52 AM  PATIENT:  Shelly Willis  71 y.o. female  PRE-OPERATIVE DIAGNOSIS:  right breast cancer   POST-OPERATIVE DIAGNOSIS:  right breast cancer   PROCEDURE:  Procedure(s) (LRB): MODIFIED MASTECTOMY (Right)  SURGEON:  Surgeon(s) and Role:    * Valarie Merino, MD - Primary  PHYSICIAN ASSISTANT:   ASSISTANTS: none   ANESTHESIA:   general  EBL:  Total I/O In: 1600 [I.V.:1600] Out: -   BLOOD ADMINISTERED:none  DRAINS: (one) Jackson-Pratt drain(s) with closed bulb suction in the right axilla   LOCAL MEDICATIONS USED:  MARCAINE     SPECIMEN:  Source of Specimen:  right breast and axillary sampling  DISPOSITION OF SPECIMEN:  PATHOLOGY  COUNTS:  YES  TOURNIQUET:  * No tourniquets in log *   DICTATION: .Other Dictation: Dictation Number [id number]  PLAN OF CARE: Admit for overnight observation  PATIENT DISPOSITION:  PACU - hemodynamically stable.   Delay start of Pharmacological VTE agent (>24hrs) due to surgical blood loss or risk of bleeding: no

## 2011-12-28 NOTE — Anesthesia Preprocedure Evaluation (Signed)

## 2011-12-29 ENCOUNTER — Encounter (HOSPITAL_BASED_OUTPATIENT_CLINIC_OR_DEPARTMENT_OTHER): Payer: Self-pay | Admitting: Surgery

## 2011-12-29 ENCOUNTER — Telehealth (INDEPENDENT_AMBULATORY_CARE_PROVIDER_SITE_OTHER): Payer: Self-pay | Admitting: Surgery

## 2011-12-29 NOTE — Telephone Encounter (Signed)
Called patient to advise post op appointment made for 01/06/12 at 9:10. Advised patient to please bring list of drainage for Dr. Daphine Deutscher to review upon visit. Also advised patient to be here 15 minutes prior for registration.

## 2011-12-29 NOTE — Discharge Instructions (Signed)
Total or Modified Radical Mastectomy  Care After Refer to this sheet in the next few weeks. These instructions provide you with information on caring for yourself after your procedure. Your caregiver may also give you more specific instructions. Your treatment has been planned according to current medical practices, but problems sometimes occur. Call your caregiver if you have any problems or questions after your procedure. ACTIVITY  Your caregiver will advise you when you may resume strenuous activities, driving, and sports.   After the drain(s) are removed, you may do light housework. Avoid heavy lifting, carrying, or pushing. You should not be lifting anything heavier than 5 lbs.   Take frequent rest periods. You may tire more easily than usual.   Always rest and elevate the arm affected by your surgery for a period of time equal to your activity time.   Continue doing the exercises given to you by the physical therapist/occupational therapist even after full range of motion has returned. The amount of time this takes will vary from person to person.   After normal range of motion has returned, some stiffness and soreness may persist for 2-3 months. This is normal and will subside.   Begin sports or strenuous activities in moderation. This will give you a chance to rebuild your endurance. Continue to be cautious of heavy lifting or carrying (no more than 10 lbs.) with your affected arm.   You may return to work as recommended by your caregiver.  NUTRITION  You may resume your normal diet.   Make sure you drink plenty of fluids (6-8 glasses a day).   Eat a well-balanced diet. Including daily portions of food from government recommended food groups:   Grains.   Vegetables.   Fruits.   Milk.   Meat & beans.   Oils.  Visit http://mypyramid.gov/ for more information HYGIENE  You may wash your hair.   If your incision (cut from surgery) is closed, you may shower or tub bathe,  unless instructed otherwise by your doctor.  FEVER  If you feel feverish or have shaking chills, take your temperature. If your temperature is 102 F (38.9 C) or above, call your caregiver. The fever may mean there is an infection.   If you call early, infection can be treated with antibiotics and hospitalization may be avoided.  PAIN CONTROL  Mild discomfort may occur.   You may need to take an over-the-counter pain medication or a medication prescribed by your caregiver.   Call your caregiver if you experience increased pain.  INCISION CARE  Check your incision daily for increased redness, drainage, swelling, or separation of skin.   Call your caregiver if any of the above are noted.  ARM AND HAND CARE  If the lymph nodes under your arm were removed with a modified radical mastectomy, there may be a greater tendency for the arm to swell.   Try to avoid having blood pressures taken, blood drawn, or injections given in the affected arm. This is the arm on the same side as the surgery.   Use hand lotion to soften cuticles instead of cutting them to avoid cutting yourself.   Be careful when shaving your under arms. Use an electric shaver if possible. You may use a deodorant after the incision has completely healed. Until then, clean under your arms with hydrogen peroxide.   Use reasonable precaution when cooking, sewing, and gardening to avoid burning or needle or thorn pricks.   Do not weigh your arm straight   down with a package or your purse.   Follow the exercises and instructions given to you by the physical therapist/occupational therapist and your caregiver.  FOLLOW-UP APPOINTMENT Call your caregiver for a follow-up appointment as directed. PROSTHESIS INFORMATION Wear your temporary prosthesis (artificial breast) until your caregiver gives you permission to purchase a permanent one. This will depend upon your rate of healing. We suggest you also wait until you are physically  and emotionally ready to shop for one. The suitability depends on several individual factors. We do not endorse any particular prosthesis, but suggest you try several until you are satisfied with appearance and fit. A list of stores may be obtained from your local [employer] at www.cancer.org or 1-800-ACS-2345 (1-(559)505-3248). A permanent prosthesis is medically necessary to restore balance. It is also income tax deductible. Be sure all receipts are marked "surgical". It is not essential to purchase a bra. You may sew a pocket into your regular bra. Note: Remember to take all of your medical insurance information with you when shopping for your prosthesis. SELECTING A PROSTHESIS FITTER You may want to ask the following questions when selecting a fitter:  What styles and brands of forms are carried in stock?   How long have the forms been on the market and have there been any problems with them?   Why would one form be better than another?   How long should a particular form last?   May I wear the form for a trial period without obligation?   Do the forms require a prosthetic bra? If so, what is the price range? Must I always wear that style?   If alterations to the bra are necessary, can they be done at this location or be sent out?   Will I be charged for alterations?   Will I receive suggestions on how to alter my own wardrobe, if necessary?   Will you special order forms or bras if necessary?   Are fitters always available to meet my needs?   What kinds of garments should be worn for the fitting?   Are lounge wear, swim wear, and accessories available?   If I have insurance coverage or Medicare, will you suggest ways for processing the paper work?   Do you keep complete records so that mail reordering is possible?   How are warranty claims handled if I have a problem with the form?   About my Jackson-Pratt Bulb Drain  What is a Jackson-Pratt bulb? A  Jackson-Pratt is a soft, round device used to collect drainage. It is connected to a long, thin drainage catheter, which is held in place by one or two small stiches near your surgical incision site. When the bulb is squeezed, it forms a vacuum, forcing the drainage to empty into the bulb.  Emptying the Jackson-Pratt bulb- To empty the bulb: 1. Release the plug on the top of the bulb. 2. Pour the bulb's contents into a measuring container which your nurse will provide. 3. Record the time emptied and amount of drainage. Empty the drain(s) as often as your     doctor or nurse recommends.  Date                  Time                    Amount (Drain 1)                 Amount (Drain 2)  _____________________________________________________________________  _____________________________________________________________________  _____________________________________________________________________  _____________________________________________________________________  _____________________________________________________________________  _____________________________________________________________________  _____________________________________________________________________  _____________________________________________________________________  Squeezing the Jackson-Pratt Bulb- To squeeze the bulb: 1. Make sure the plug at the top of the bulb is open. 2. Squeeze the bulb tightly in your fist. You will hear air squeezing from the bulb. 3. Replace the plug while the bulb is squeezed. 4. Use a safety pin to attach the bulb to your clothing. This will keep the catheter from     pulling at the bulb insertion site.  When to call your doctor- Call your doctor if:  Drain site becomes red, swollen or hot.  You have a fever greater than 101 degrees F.  There is oozing at the drain site.  Drain falls out (apply a guaze bandage over the drain hole and secure it with tape).  Drainage  increases daily not related to activity patterns. (You will usually have more drainage when you are active than when you are resting.)  Drainage has a bad odor.

## 2011-12-30 NOTE — Op Note (Signed)
NAMEELICE, Shelly Willis           ACCOUNT NO.:  [id number]  MEDICAL RECORD NO.:  [id number]  LOCATION:                                 FACILITY:  PHYSICIAN:  Thornton Park. Daphine Deutscher, MD  DATE OF BIRTH:  13-Nov-1940  DATE OF PROCEDURE:  12/28/2011 DATE OF DISCHARGE:                              OPERATIVE REPORT   PREOPERATIVE DIAGNOSIS:  Advanced right breast cancer.  PROCEDURE:  Right modified mastectomy.  SURGEON:  Thornton Park. Daphine Deutscher, MD  ASSISTANT:  None.  ANESTHESIA:  General endotracheal.  DRAINS:  One JP in the right axilla and flap.  DESCRIPTION OF PROCEDURE:  The patient was taken to room #6 at [facility] and given general anesthesia.  The abdomen was prepped with PCMX and draped sterilely.  An elliptical incision was made to excise her previous lumpectomy site.  Superomedial inferior flaps were raised, and then carried back down to the chest wall.  Laterally, I went to the edge of the latissimus dorsi and then began by removing the breast from medial to lateral with the Bovie.  In the area of the previous lumpectomy, went deep and took the muscle with the specimen to try to get a posterior margin.  I then swept the breast off and then went up and along the pectoralis major muscle up into the axilla where I identified the long thoracic nerve of Bell in the thoracodorsal nerves and preserved those in removing axillary contents up to the area of where it was very stuck.  I took 2 nodes that were stuck right down on the thoracodorsal nerve, just below the vein.  These were sent separately.  Wounds were irrigated.  Clips were used in the axilla to control neurovascular bundles.  There was essentially no bleeding noted. The wound was closed with 3-0 Vicryl subcutaneously and with staples. The patient tolerated the procedure well and was taken to recovery room in satisfactory condition.  She will be kept for overnight observation.     Thornton Park Daphine Deutscher,  MD     MBM/MEDQ  D:  12/28/2011  T:  12/28/2011  Job:  [id number]  cc:   Lowella Dell, M.D.

## 2011-12-31 ENCOUNTER — Telehealth (INDEPENDENT_AMBULATORY_CARE_PROVIDER_SITE_OTHER): Payer: Self-pay

## 2011-12-31 NOTE — Telephone Encounter (Signed)
The patient's husband called and inquired about how the patient should bathe.  I instructed that she needs to keep the drain site dry.  She can shower only if she can keep it covered.  Otherwise she should take a sponge bath with soapy water.

## 2012-01-06 ENCOUNTER — Encounter (INDEPENDENT_AMBULATORY_CARE_PROVIDER_SITE_OTHER): Payer: Self-pay | Admitting: Surgery

## 2012-01-06 ENCOUNTER — Ambulatory Visit (INDEPENDENT_AMBULATORY_CARE_PROVIDER_SITE_OTHER): Payer: Medicare Other | Admitting: Surgery

## 2012-01-06 VITALS — BP 142/98 | HR 76 | Temp 98.0°F | Resp 18 | Ht 65.0 in | Wt 159.6 lb

## 2012-01-06 DIAGNOSIS — C50419 Malignant neoplasm of upper-outer quadrant of unspecified female breast: Secondary | ICD-10-CM

## 2012-01-06 NOTE — Progress Notes (Signed)
Shelly Willis 71 y.o.  Body mass index is 26.56 kg/(m^2).  Patient Active Problem List  Diagnoses  . Malignant neoplasm of upper-outer quadrant of female breast  . Status post right breast lumpectomy    Allergies  Allergen Reactions  . Vesicare (Solifenacin Succinate)     Headaches and upset stomach     Past Surgical History  Procedure Date  . Cholecystectomy 1993  . Tubal ligation 1977  . Abdominal hysterectomy 1997  . Bladder surgery 2011    growth attached to bladder stem   . Bilateral salpingoophorectomy   . Ruptured disc surgery 1988 1988  . Kidney stones   . Modified mastectomy 12/28/2011    Procedure: MODIFIED MASTECTOMY;  Surgeon: Valarie Merino, MD;  Location: [facility];  Service: General;  Laterality: Right;  Right modified mastectomy/   . Breast surgery 11/30/2011    Right breast lumpectomy   HAWKINS,EDWARD L, MD, MD No diagnosis found.  Her axilla was stuck and her highest node on the vein was positive 5/5 nodes positive.  Her mastectomy margins were clear but there was residual disease found.  JP drainage down and the drain was removed.  Will get her back next week at 2 weeks for staple removal.  Flaps OK.    Matt B. Daphine Deutscher, MD, [facility] [phone] beeper [phone]  01/06/2012 9:36 AM

## 2012-01-10 ENCOUNTER — Encounter (INDEPENDENT_AMBULATORY_CARE_PROVIDER_SITE_OTHER): Payer: Self-pay

## 2012-01-10 ENCOUNTER — Ambulatory Visit (INDEPENDENT_AMBULATORY_CARE_PROVIDER_SITE_OTHER): Payer: Medicare Other

## 2012-01-10 NOTE — Progress Notes (Unsigned)
Patient arrived today in order to have staples removed from surgical site. Per my conversation with Dr. Daphine Deutscher, every other staple was removed from site and 1/2 inch steri strips were applied. Shelly Willis performed the removal of staples and application of steri strips. Patient to come back to office on 01/14/12 for another nurse only visit during Dr. Ermalene Searing clinic.

## 2012-01-14 ENCOUNTER — Encounter (INDEPENDENT_AMBULATORY_CARE_PROVIDER_SITE_OTHER): Payer: Self-pay

## 2012-01-14 ENCOUNTER — Ambulatory Visit (INDEPENDENT_AMBULATORY_CARE_PROVIDER_SITE_OTHER): Payer: Medicare Other

## 2012-01-14 NOTE — Progress Notes (Unsigned)
Patient seen today for a nurse only visit for removal of the remainder of the staples from her mastectomy performed. 1/2 inch steri strips applied, incision site did not show discharge or infection. Staples removed by Alvester Morin, RN. Site did not require approximation in order for strips to be applied.

## 2012-01-20 ENCOUNTER — Telehealth (INDEPENDENT_AMBULATORY_CARE_PROVIDER_SITE_OTHER): Payer: Self-pay | Admitting: General Surgery

## 2012-01-20 NOTE — Telephone Encounter (Signed)
Husband calling to request pain med refill. Per protocol, Hydrocodone 5/325 mg, # 30, 1-2 po Q4-6H prn pain, NO refill--called to pharmacist at [facility] in [place]:  [phone].

## 2012-01-27 ENCOUNTER — Other Ambulatory Visit (HOSPITAL_BASED_OUTPATIENT_CLINIC_OR_DEPARTMENT_OTHER): Payer: Medicare Other | Admitting: Lab

## 2012-01-27 ENCOUNTER — Telehealth: Payer: Self-pay | Admitting: *Deleted

## 2012-01-27 ENCOUNTER — Ambulatory Visit (HOSPITAL_BASED_OUTPATIENT_CLINIC_OR_DEPARTMENT_OTHER): Payer: Medicare Other | Admitting: Oncology

## 2012-01-27 VITALS — BP 186/79 | HR 87 | Temp 98.4°F | Ht 64.0 in | Wt 158.8 lb

## 2012-01-27 DIAGNOSIS — C50419 Malignant neoplasm of upper-outer quadrant of unspecified female breast: Secondary | ICD-10-CM

## 2012-01-27 DIAGNOSIS — Z170 Estrogen receptor positive status [ER+]: Secondary | ICD-10-CM

## 2012-01-27 DIAGNOSIS — Z9010 Acquired absence of unspecified breast and nipple: Secondary | ICD-10-CM

## 2012-01-27 DIAGNOSIS — Z9889 Other specified postprocedural states: Secondary | ICD-10-CM

## 2012-01-27 LAB — CBC WITH DIFFERENTIAL/PLATELET
BASO%: 0.5 % (ref 0.0–2.0)
EOS%: 1.6 % (ref 0.0–7.0)
HCT: 39.9 % (ref 34.8–46.6)
MCH: 30.5 pg (ref 25.1–34.0)
MCHC: 32.4 g/dL (ref 31.5–36.0)
MONO#: 0.9 10*3/uL (ref 0.1–0.9)
NEUT%: 57.4 % (ref 38.4–76.8)
RBC: 4.24 10*6/uL (ref 3.70–5.45)
RDW: 13.9 % (ref 11.2–14.5)
WBC: 10.4 10*3/uL — ABNORMAL HIGH (ref 3.9–10.3)
lymph#: 3.3 10*3/uL (ref 0.9–3.3)
nRBC: 0 % (ref 0–0)

## 2012-01-27 NOTE — Telephone Encounter (Signed)
gave patient appointment for dr.wentworth on 02-02-2012 starting at 8:30am patient confirmed in person the appointment with dr.wentworth

## 2012-01-27 NOTE — Progress Notes (Signed)
ID: Earnestine Leys  HPI:  The patient had routine screening mammography 04/21/2011. This showed a possible distortion in the right breast. The patient was brought back for additional views October 3 which confirmed the area of distortion. Ultrasound showed a 1.8 cm suspicious area and this was biopsied October 17, showing [facility] [id number]) an invasive mammary carcinoma with lobular features. This was strongly estrogen and progesterone receptor positive, HER-2 negative, with an elevated MIB-1. The patient was then referred to Dr. Wenda Low and bilateral breast MRIs were obtained October 22 confirming a 2.4 cm mass in the upper right breast at the 12:00 position. There was some right nipple retraction and skin thickening but this was felt to be secondary to post biopsy change. There were no other suspicious areas of concern. Specifically there was no evidence of axillary or internal mammary adenopathy. She was treated neoadjuvantly with letrozole, with her further breast cancer history as detailed below.     Interval History: Artavia returns today with her husband [name] for followup of her breast cancer. Since the last visit here she had a right simple mastectomy and a further axillary lymph node removal under Wenda Low, 12/18/2011.  ROS:  She tolerated the surgery well, with a pain as the chief problem. This is being controlled with hydrocodone/APAP, which is not constipating her. She has had no bleeding, fever, significant swelling, erythema, or inflammation. There have been no unusual headaches, visual changes, cough, phlegm production, pleurisy, shortness of breath, or change in bowel or bladder habits. Her hot flashes that would just about gone". She is tolerating her letrozole with no side effects it she is aware of. She pays $6 per month for that medication. A detailed review of systems today was otherwise noncontributory  Past Medical History   Diagnosis  Date   .  Cancer      breast   .   Nasal congestion    .  Sore throat    .  Cataract    .  Hyperlipidemia     Past Surgical History   Procedure  Date   .  Cholecystectomy  1993   .  Tubal ligation  1977   .  Back surgery  1987     ruptured disc   .  Abdominal hysterectomy  1997   .  Bladder surgery  2011     growth attached to bladder stem    Family history: The patient's father died in an automobile accident at the age of 39. The patient's mother died in her mid-80s status post stroke the patient has one brother and one sister in fair health there is no history of breast or ovarian cancer in the family   Gynecologic history: Menarche age 74 hysterectomy age 51 the patient took hormone replacement approximately 20 years. 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 Her husband [name] is present today; they have been married 51 years; he is a retired Acupuncturist. Son Raiford Noble not present today works as a Technical sales engineer daughter-in-law Archie Patten  is a homemaker and homeschools her 3 children. The patient attends the [facility] maintenance:  reports that she has quit smoking. She has never used smokeless tobacco. She reports that she drinks alcohol. She reports that she does not use illicit drugs.  Cholesterol treated with diet  Bone density normal bone  density 2005  Colonoscopy last colonoscopy 2003  (PAP) patient no longer has Pap smears being status post hysterectomy   Medications:  Current Outpatient Prescriptions  Medication Sig Dispense Refill  . calcium citrate-vitamin D 200-200 MG-UNIT TABS Take 1 tablet by mouth daily.        Marland Kitchen letrozole (FEMARA) 2.5 MG tablet Take 2.5 mg by mouth daily.        .           Objective: Middle-aged white woman in no acute distress  Filed Vitals:   01/27/12 1112  BP: 186/79  Pulse: 87  Temp: 98.4 F (36.9 C)    Physical Exam:     Sclerae unicteric  Oropharynx clear  No cervical or supraclavicular adenopathy,   Lungs clear -- no rales or rhonchi  Heart regular rate and rhythm  Abdomen benign  MSK no focal spinal tenderness, no peripheral edema and specifically no swelling of the right upper extremity.  Neuro nonfocal  Breast exam: The right breast is status post mastectomy. The incision is healing well, with a small eschar mid incision. There is no significant erythema, swelling, or tenderness. I do not palpate any adenopathy in the right axilla. The left breast is unremarkable.   Lab Results: CMP      Component Value Date/Time   NA 142 12/20/2011 0803   K 5.1 12/20/2011 0803   CL 106 12/20/2011 0803   CO2 27 12/20/2011 0803   GLUCOSE 97 12/20/2011 0803   BUN 17 12/20/2011 0803   CREATININE 0.92 12/20/2011 0803   CALCIUM 10.8* 12/20/2011 0803   PROT 7.0 12/20/2011 0803   ALBUMIN 4.4 12/20/2011 0803   AST 16 12/20/2011 0803   ALT 16 12/20/2011 0803   ALKPHOS 121* 12/20/2011 0803   BILITOT 0.3 12/20/2011 0803   GFRNONAA 62* 11/25/2011 0927   GFRAA 72* 11/25/2011 0927    CBC Lab Results  Component Value Date   WBC 10.4* 01/27/2012   HGB 12.9 01/27/2012   HCT 39.9 01/27/2012   MCV 94.1 01/27/2012   PLT 306 01/27/2012   NEUTROABS 6.0 01/27/2012   CA 27-29 decreased from 78 to 53 pre-op  Studies/Results: *RADIOLOGY REPORT*  Clinical Data: 71 year old female with history of breast cancer had  an abnormal whole body bone scan on 07/14/2011, and subsequent  lesion detected in the thoracic spine at T8. Follow-up for  progression. Mid back pain.  MRI THORACIC SPINE WITHOUT AND WITH CONTRAST  Technique: Multiplanar and multiecho pulse sequences of the  thoracic spine were obtained without and with intravenous contrast.  Contrast: 15mL MULTIHANCE GADOBENATE DIMEGLUMINE 529 MG/ML IV SOLN  Comparison: 07/30/2011 thoracic MRI. Whole body bone scan  07/14/2011.  Findings: Limited sagittal imaging of the cervical spine  remarkable  for spondylosis. At the inferior C7 vertebral body there is  anterior and inferior endplate marrow edema and enhancement which  is stable or decreased since November, likely degenerative.  Thoracic spine marrow signal remains normal outside of the T8  vertebra. Thoracic vertebral height and alignment is stable.  The T8 level shows continued, marrow edema along the left aspect of  the body with evidence of an diagonal fracture plane. On sagittal  images the degree of marrow edema and enhancement appears slightly  decreased while on axial images the lesion to appears stable or  decreased in size. No extension into the left pedicle. No extra  osseous extension. No loss of vertebral body height. Minimal  marrow edema previously seen at the T7  inferior endplate has  resolved.  Occasional small thoracic disc protrusions are noted, including at  T1-T2 posteriorly. These slightly narrow the ventral CSF space  without thoracic spinal stenosis. Spinal cord signal is within  normal limits at all visualized levels. Normal conus at to T12-L1.  No abnormal intradural enhancement. Visualized paraspinal soft  tissues are within normal limits.  Stable visualized thoracic and upper abdominal viscera; mild  atelectasis suspected.  IMPRESSION:  1. Stable to mildly decreased marrow edema and enhancement at the  T8 level. In the absence of specific treatment to this lesion, and  considering no new thoracic lesions, benign etiology is favored.  No interval vertebral body collapse.  2. Interval stable or decreased mild marrow edema at the C7  inferior endplate, compatible with degenerative etiology.  3. No new abnormality in the thoracic spine.  Original Report Authenticated By: Harley Hallmark, M.D.   Assessment: 71 year-old [place] woman with a stage IIIA invasive lobular breast cancer  (1) status post right breast biopsy October 2012 for a clinical T2 NX invasive lobular carcinoma,  estrogen and progesterone receptor positive at 99% and 23%, HER-2 not amplified, with an MIB-1 of 32%  (2) treated neoadjuvantly with letrozole since the first week in November, with evidence of response   (3) status-post right lumpectomy and sentinel lymph node sampling 11/30/2011 for a T2 N2, stage IIIA invasive lobular breast cancer, again HER-2 negative, with positive margins  (4) status-post right mastectomy 12/28/2011 showing minimal residual disease in the breast, with an additional lymph node positive for tumor (total 5/5) but cleared margins   Plan: The case was again presented at the multidisciplinary breast cancer conference 01/26/2012. She is now ready for postmastectomy radiation, and I have made her an appointment with Dr. Michell Heinrich to discuss this. The patient would like to be treated in [place], and of course this can easily be arranged. We're continuing the letrozole right through the radiation treatments.  She will see [place] again in 3 months. We will do physical exam and lab work at during that visit. She will then see [place] again likely in January of 2013, and before that visit she will have a repeat chest x-ray and bone scan. The plan for now is to continue letrozole for a minimum of 5 years. [place] knows to call us for any problems that may develop before the next visit.   Azel Gumina C 01/27/2012

## 2012-02-01 ENCOUNTER — Encounter: Payer: Self-pay | Admitting: Radiation Oncology

## 2012-02-02 ENCOUNTER — Ambulatory Visit
Admission: RE | Admit: 2012-02-02 | Discharge: 2012-02-02 | Disposition: A | Discharge status: Home / Self Care | Payer: Medicare Other | Source: Ambulatory Visit | Attending: Radiation Oncology | Admitting: Radiation Oncology

## 2012-02-02 ENCOUNTER — Encounter: Payer: Self-pay | Admitting: Radiation Oncology

## 2012-02-02 VITALS — BP 150/82 | HR 93 | Temp 97.2°F | Resp 18 | Ht 65.0 in | Wt 160.3 lb

## 2012-02-02 DIAGNOSIS — C50419 Malignant neoplasm of upper-outer quadrant of unspecified female breast: Secondary | ICD-10-CM

## 2012-02-02 DIAGNOSIS — C50919 Malignant neoplasm of unspecified site of unspecified female breast: Secondary | ICD-10-CM | POA: Insufficient documentation

## 2012-02-02 DIAGNOSIS — Z79899 Other long term (current) drug therapy: Secondary | ICD-10-CM | POA: Insufficient documentation

## 2012-02-02 NOTE — Progress Notes (Signed)
Complete NUTRITION RISK SCREEN worksheet without concerns submitted to Barbara Neff, RD. Also, complete PATIENT MEASURE OF DISTRESS worksheet with a score of 0 submitted to social work.  

## 2012-02-02 NOTE — Progress Notes (Signed)
Encounter addended by: Agnes Lawrence, RN on: 02/02/2012  9:41 AM<BR>     Documentation filed: Charges VN, Inpatient Patient Education, Inpatient Document Flowsheet, Notes Section

## 2012-02-02 NOTE — Progress Notes (Signed)
Please see progress note under physician encounter 

## 2012-02-02 NOTE — Progress Notes (Signed)
Patient presents to the clinic today accompanied by her husband of 51 years, Shelly Willis, for a follow up new consult with Dr. Michell Heinrich. Patient is alert and oriented to person, place, and time. No distress noted. Steady gait noted. Pleasant affect noted. Patient reports constant burning and throbbing in her right arm 6 on a scale of 0-10 for which Norco 5/325 mg every six hours relieves. Patient reports that the incision in her right breast following lumpectomy and mastectomy has closed and are without redness, warmth, drainage or edema. Patient denies pain in her breast. Patient report taking letrozole since November 2012 without complications. Patient is retired but, works occasionally as a Social worker. Patient has expressed a desire to be treated in [place] only five miles from her home. Patient to follow up with [facility] in three month then again in January with a chest xray and bone scan. Patient denies nausea, vomiting, headache, dizziness or diarrhea. Patient denies constipation while taking pain medication because she drinks a glass of prune juice each morning. Patient reports eating and sleeping without difficulty. Patient reports her weight is stable. Reported all findings to Dr. Michell Heinrich.    Ax: Vesicare causes upset stomach and headache No hx of radiation therapy Denies pacemaker

## 2012-02-02 NOTE — Progress Notes (Signed)
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 Vesicare (Solifenacin Succinate)     Headaches and upset stomach     Medications:  Current Outpatient Prescriptions  Medication Sig Dispense Refill  . calcium citrate-vitamin D 200-200 MG-UNIT TABS Take 1 tablet by mouth 2 (two) times daily.       Marland Kitchen HYDROcodone-acetaminophen (NORCO) 5-325 MG per tablet Take 1 tablet by mouth every 6 (six) hours as needed.      Marland Kitchen letrozole (FEMARA) 2.5 MG tablet Take 2.5 mg by mouth daily.       Marland Kitchen gabapentin (NEURONTIN) 300 MG capsule Take 300 mg by mouth at bedtime.      Marland Kitchen ROXICET 5-325 MG/5ML solution       . DISCONTD: estrogens, conjugated, (PREMARIN) 1.25 MG tablet Take 1.25 mg by mouth daily.         Interval History: Shelly Willis presents today for routine followup.  Shelly Willis had a lumpectomy with positive margins and a positive sentinel node lymph node. Shelly Willis then ultimately went back for a mastectomy and axillary lymph node dissection on 12/28/2011 which showed additional invasive lobular carcinoma with a close deep margin at 0.2 cm. A total of 5 out of 5 lymph nodes were positive some with extracapsular extension. Muscle was sampled and was negative for tumor on the right. Shelly Willis has recovered fairly well from Shelly Willis surgery. Shelly Willis still taking pain medication twice a day for pain underneath Shelly Willis arm. Shelly Willis has good range of motion and no lymphedema. Shelly Willis would like to receive Shelly Willis radiation at [facility]. Shelly Willis is accompanied by Shelly Willis today. Dr. Darnelle Catalan does not feel Shelly Willis would benefit from chemotherapy and has continued Shelly Willis on Shelly Willis Femara.  Physical Exam:   height is 5\' 5"  (1.651 m) and weight is 160 lb 4.8 oz (72.712 kg). Shelly Willis oral  temperature is 97.2 F (36.2 C). Shelly Willis blood pressure is 150/82 and Shelly Willis pulse is 93. Shelly Willis respiration is 18.  Shelly Willis has a well-healed surgical incision on Shelly Willis right chest wall. Shelly Willis can move Shelly Willis arms above Shelly Willis shoulders and can place Shelly Willis hands behind Shelly Willis head and clasp them.  IMPRESSION: 71 year old with a T2 N2 invasive lobular carcinoma the right breast status post mastectomy  PLAN:  Shelly Willis is a 71 y.o. female who unfortunately had to have a mastectomy and has a 5 out of 5 positive lymph nodes. I discussed with Shelly Willis and Shelly Willis indications for radiation in this setting. We discussed the risk of local failure as well as a survival benefit seen in patients who receive radiation. We discussed 6 weeks of treatment as an outpatient. We discussed the process of simulation the placement tattoos. We discussed possible risks and benefits of treatment including but not limited to skin redness skin darkening difficulties with reconstruction and damage to any structures within the chest including the lungs ribs and soft tissues. I believe because of Shelly Willis 100% lymph node positivity Shelly Willis would benefit from treatment of the supraclavicular fossa and axilla. We discussed increasing lymphedema seen with this. We will get Shelly Willis set up with the after breast cancer class. I've scheduled Shelly Willis for simulation on Friday at [facility]. Shelly Willis signed informed consent and  agreed to proceed forward.    Lurline Hare, MD

## 2012-02-10 ENCOUNTER — Telehealth (INDEPENDENT_AMBULATORY_CARE_PROVIDER_SITE_OTHER): Payer: Self-pay | Admitting: General Surgery

## 2012-02-10 NOTE — Telephone Encounter (Signed)
Husband calling to request refill of pain meds.  Still having quite a bit of discomfort under the arm, in particular.  Have refilled Vicodin once (per protocol), so advised husband to try Aleve 1-2 Q12H or Ibuprofen 2-3 Q8H.  Husband understands and will try this, but would like Dr. Daphine Deutscher to consider one more refill of the Hydrocodone.  Please advise.

## 2012-02-17 ENCOUNTER — Encounter (INDEPENDENT_AMBULATORY_CARE_PROVIDER_SITE_OTHER): Payer: Medicare Other | Admitting: Surgery

## 2012-03-29 ENCOUNTER — Ambulatory Visit (INDEPENDENT_AMBULATORY_CARE_PROVIDER_SITE_OTHER): Payer: Medicare Other | Admitting: Surgery

## 2012-03-29 ENCOUNTER — Encounter (INDEPENDENT_AMBULATORY_CARE_PROVIDER_SITE_OTHER): Payer: Self-pay | Admitting: Surgery

## 2012-03-29 VITALS — BP 147/90 | HR 96 | Temp 97.2°F | Resp 12 | Ht 65.0 in | Wt 162.8 lb

## 2012-03-29 DIAGNOSIS — L589 Radiodermatitis, unspecified: Secondary | ICD-10-CM

## 2012-03-29 NOTE — Progress Notes (Signed)
Shelly Willis 71 y.o.  Body mass index is 27.09 kg/(m^2).  Patient Active Problem List  Diagnosis  . Malignant neoplasm of upper-outer quadrant of female breast  . Status post right breast mastectomy    Allergies  Allergen Reactions  . Vesicare (Solifenacin Succinate)     Headaches and upset stomach     Past Surgical History  Procedure Date  . Cholecystectomy 1993  . Tubal ligation 1977  . Bladder surgery 2011    growth attached to bladder stem   . Bilateral salpingoophorectomy   . Ruptured disc surgery 1988 1988  . Kidney stones   . Modified mastectomy 12/28/2011    Procedure: MODIFIED MASTECTOMY;  Surgeon: Valarie Merino, MD;  Location: [facility];  Service: General;  Laterality: Right;  Right modified mastectomy/   . Breast surgery 12/01/2011    Right breast lumpectomy  . Needle core biopsy 11/02/11    Left Breast Needle Core Biopsy, 9'Oclocl, No Malignancy: Biospy Axilla - Mammary Carcinoma, ER/PR Positive, Low Ki67  . Needle core biopsy 06/16/11    Right Breast - Invasive Mammary with Lobular features  . Abdominal hysterectomy 1997    complete   HAWKINS,EDWARD L, MD No diagnosis found.  Postop in RT mode with significant radiation dermatitis of the right chest wall.  Tired and noticing BP readings are higher .  Taking ibuprofen which helps.   BP 147/90  She is anticipating primary care PE--wants to hold off on left mammogram since we have recently done MRI.  She will receive postop surveilance.    Will see back in 6 months.   Matt B. Daphine Deutscher, MD, [facility] [phone] beeper [phone]  03/29/2012 3:18 PM

## 2012-05-02 ENCOUNTER — Encounter (HOSPITAL_COMMUNITY): Payer: Self-pay

## 2012-05-02 ENCOUNTER — Emergency Department (HOSPITAL_COMMUNITY)
Admission: EM | Admit: 2012-05-02 | Discharge: 2012-05-02 | Disposition: A | Discharge status: Home / Self Care | Payer: No Typology Code available for payment source | Attending: Emergency Medicine | Admitting: Emergency Medicine

## 2012-05-02 DIAGNOSIS — R1084 Generalized abdominal pain: Secondary | ICD-10-CM | POA: Insufficient documentation

## 2012-05-02 DIAGNOSIS — C50919 Malignant neoplasm of unspecified site of unspecified female breast: Secondary | ICD-10-CM | POA: Insufficient documentation

## 2012-05-02 DIAGNOSIS — E785 Hyperlipidemia, unspecified: Secondary | ICD-10-CM | POA: Insufficient documentation

## 2012-05-02 DIAGNOSIS — Z79899 Other long term (current) drug therapy: Secondary | ICD-10-CM | POA: Insufficient documentation

## 2012-05-02 DIAGNOSIS — Z87891 Personal history of nicotine dependence: Secondary | ICD-10-CM | POA: Insufficient documentation

## 2012-05-02 DIAGNOSIS — S20219A Contusion of unspecified front wall of thorax, initial encounter: Secondary | ICD-10-CM | POA: Insufficient documentation
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
## 2012-05-02 NOTE — ED Notes (Signed)
Patient ambulated around nursing's station and back to room with no assist and no complications.

## 2012-05-03 ENCOUNTER — Other Ambulatory Visit (HOSPITAL_COMMUNITY): Payer: Self-pay | Admitting: Pulmonary Disease

## 2012-05-03 DIAGNOSIS — C50919 Malignant neoplasm of unspecified site of unspecified female breast: Secondary | ICD-10-CM

## 2012-05-31 ENCOUNTER — Other Ambulatory Visit (HOSPITAL_COMMUNITY): Payer: Self-pay | Admitting: Pulmonary Disease

## 2012-05-31 ENCOUNTER — Ambulatory Visit (HOSPITAL_COMMUNITY)
Admission: RE | Admit: 2012-05-31 | Discharge: 2012-05-31 | Disposition: A | Discharge status: Home / Self Care | Payer: Medicare Other | Source: Ambulatory Visit | Attending: Pulmonary Disease | Admitting: Pulmonary Disease

## 2012-05-31 DIAGNOSIS — C50919 Malignant neoplasm of unspecified site of unspecified female breast: Secondary | ICD-10-CM

## 2012-05-31 DIAGNOSIS — Z9010 Acquired absence of unspecified breast and nipple: Secondary | ICD-10-CM | POA: Insufficient documentation

## 2012-05-31 DIAGNOSIS — Z853 Personal history of malignant neoplasm of breast: Secondary | ICD-10-CM | POA: Insufficient documentation

## 2012-07-22 ENCOUNTER — Other Ambulatory Visit: Payer: Self-pay | Admitting: Oncology

## 2012-07-24 ENCOUNTER — Other Ambulatory Visit: Payer: Self-pay | Admitting: *Deleted

## 2012-07-24 ENCOUNTER — Telehealth: Payer: Self-pay | Admitting: Oncology

## 2012-07-24 DIAGNOSIS — C50919 Malignant neoplasm of unspecified site of unspecified female breast: Secondary | ICD-10-CM

## 2012-07-24 MED ORDER — LETROZOLE 2.5 MG PO TABS: 2.5000 mg | ORAL_TABLET | Freq: Every day | ORAL | Status: DC

## 2012-07-24 NOTE — Telephone Encounter (Signed)
lmonvm adviisng the pt of her lab and md appts in jan 2014

## 2012-07-25 ENCOUNTER — Telehealth: Payer: Self-pay | Admitting: Oncology

## 2012-07-25 ENCOUNTER — Other Ambulatory Visit: Payer: Self-pay | Admitting: *Deleted

## 2012-07-25 NOTE — Telephone Encounter (Signed)
Mailed the pt her jan 2014 appt calendar °

## 2012-09-11 ENCOUNTER — Telehealth: Payer: Self-pay | Admitting: Oncology

## 2012-09-11 NOTE — Telephone Encounter (Signed)
I called the patient to let her know she will be seeing Warner Mccreedy tomorrow at her appt.

## 2012-09-12 ENCOUNTER — Encounter: Payer: Self-pay | Admitting: Gynecologic Oncology

## 2012-09-12 ENCOUNTER — Ambulatory Visit: Payer: Medicare Other | Admitting: Physician Assistant

## 2012-09-12 ENCOUNTER — Telehealth: Payer: Self-pay | Admitting: *Deleted

## 2012-09-12 ENCOUNTER — Other Ambulatory Visit (HOSPITAL_BASED_OUTPATIENT_CLINIC_OR_DEPARTMENT_OTHER): Payer: Medicare Other

## 2012-09-12 ENCOUNTER — Ambulatory Visit (HOSPITAL_BASED_OUTPATIENT_CLINIC_OR_DEPARTMENT_OTHER): Payer: Medicare Other | Admitting: Gynecologic Oncology

## 2012-09-12 VITALS — BP 165/81 | HR 80 | Temp 97.7°F | Resp 20 | Ht 65.0 in | Wt 161.4 lb

## 2012-09-12 DIAGNOSIS — C50419 Malignant neoplasm of upper-outer quadrant of unspecified female breast: Secondary | ICD-10-CM

## 2012-09-12 DIAGNOSIS — R030 Elevated blood-pressure reading, without diagnosis of hypertension: Secondary | ICD-10-CM

## 2012-09-12 DIAGNOSIS — R05 Cough: Secondary | ICD-10-CM

## 2012-09-12 DIAGNOSIS — C50919 Malignant neoplasm of unspecified site of unspecified female breast: Secondary | ICD-10-CM

## 2012-09-12 DIAGNOSIS — Z853 Personal history of malignant neoplasm of breast: Secondary | ICD-10-CM

## 2012-09-12 DIAGNOSIS — Z9010 Acquired absence of unspecified breast and nipple: Secondary | ICD-10-CM

## 2012-09-12 DIAGNOSIS — Z9889 Other specified postprocedural states: Secondary | ICD-10-CM

## 2012-09-12 LAB — CBC WITH DIFFERENTIAL/PLATELET
BASO%: 0.7 % (ref 0.0–2.0)
Basophils Absolute: 0.1 10*3/uL (ref 0.0–0.1)
EOS%: 2 % (ref 0.0–7.0)
HGB: 12.9 g/dL (ref 11.6–15.9)
MCH: 30.7 pg (ref 25.1–34.0)
MCHC: 34 g/dL (ref 31.5–36.0)
MCV: 90 fL (ref 79.5–101.0)
MONO%: 10.5 % (ref 0.0–14.0)
RBC: 4.2 10*6/uL (ref 3.70–5.45)
RDW: 15 % — ABNORMAL HIGH (ref 11.2–14.5)

## 2012-09-12 LAB — COMPREHENSIVE METABOLIC PANEL (CC13)
Albumin: 3.7 g/dL (ref 3.5–5.0)
Alkaline Phosphatase: 137 U/L (ref 40–150)
BUN: 11 mg/dL (ref 7.0–26.0)
Calcium: 10 mg/dL (ref 8.4–10.4)
Chloride: 106 mEq/L (ref 98–107)
Glucose: 96 mg/dl (ref 70–99)
Potassium: 4 mEq/L (ref 3.5–5.1)

## 2012-09-12 NOTE — Progress Notes (Signed)
ID: Earnestine Leys  HPI:  The patient had routine screening mammography 04/21/2011. This showed a possible distortion in the right breast. The patient was brought back for additional views October 3 which confirmed the area of distortion. Ultrasound showed a 1.8 cm suspicious area and this was biopsied October 17, showing [facility] [id number]) an invasive mammary carcinoma with lobular features. This was strongly estrogen and progesterone receptor positive, HER-2 negative, with an elevated MIB-1. The patient was then referred to Dr. Wenda Low and bilateral breast MRIs were obtained October 22 confirming a 2.4 cm mass in the upper right breast at the 12:00 position. There was some right nipple retraction and skin thickening but this was felt to be secondary to post biopsy change. There were no other suspicious areas of concern. Specifically there was no evidence of axillary or internal mammary adenopathy. She was treated neoadjuvantly with letrozole, with her further breast cancer history as detailed below.  She underwent a right simple mastectomy and a further lymph node removal by Dr. Luretha Murphy on 12/18/2011.    Interval History:  Mrs. Slavens presents for followup of her breast cancer today with her husband.  Since her last visit with Dr. Darnelle Catalan, her husband reports an increase in short term memory loss, the development of a dry cough, trouble lifting her right arm, overall fatigue, and worsening cataracts.  He reports that she has been unable to remember appointments and that the patient turns to him when asked questions because she is unsure of the answer.  She denies any episodes of becoming disoriented to person, place, or time.  She began taking Lisinopril in November 2013 for elevated blood pressure per Dr. Juanetta Gosling, primary care provider, in [place].  Since that time, she reports the development of a dry cough.  No sputum production or fever reported.  She states that she has not followed up  with Dr. Juanetta Gosling since the development of the cough.  Beginning in December 2013, she reports that she has been feeling weak and fatigued.  She had been exercising at a local gym with her husband prior to December but stopped due to feelings of fatigue.  She has recently noticed difficulty with her range of motion in her right arm after discontinuing her exercise regimen.  She denies any recent falls.  She reports worsening in her bilateral cataracts, stating "I can barely see."  She plans for surgical intervention if approved by Dr. Darnelle Catalan.  She reports having an occasional hot flash, but describes the episodes as "not many and not bad."  She voices concerns about a hardened area above her mastectomy scar on the right breast but denies pain to that area.      ROS:   Constitutional: Feels fatigued.  Reports increased short term memory loss.  No fever or chills. HEENT:  Reports visual changes with worsening cataracts.  No headaches or dizziness. Cardiovascular: No chest pain, shortness of breath, or edema.  Pulmonary: Reports the onset of a dry cough for the past two months.  No sputum production noted.  No wheeze or dyspnea.  Gastrointestinal: No nausea, vomiting, or diarrhea. No bright red blood per rectum or change in bowel movement.  Genitourinary: No frequency, urgency, or dysuria. No vaginal bleeding, discharge, or dryness reported.  Musculoskeletal: Reporting limited range of motion with her RUE.  No joint pain or recent falls. Neurologic: Reports feeling weak.  No numbness or change in gait.  Psychology: No depression, anxiety, or insomnia  Past Medical History  Diagnosis  Date   .  Cancer      breast   .  Nasal congestion    .  Sore throat    .  Cataract    .  Hyperlipidemia     Past Surgical History   Procedure  Date   .  Cholecystectomy  1993   .  Tubal ligation  1977   .  Back surgery  1987     ruptured disc   .  Abdominal hysterectomy  1997   .  Bladder surgery  2011      growth attached to bladder stem    Family history: The patient's father died in an automobile accident at the age of 39. The patient's mother died in her mid-80s status post stroke the patient has one brother and one sister in fair health there is no history of breast or ovarian cancer in the family   Gynecologic history: Menarche age 28 hysterectomy age 34 the patient took hormone replacement approximately 20 years. 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 Her husband [name] is present today; they have been married 51 years; he is a retired Acupuncturist. Son Raiford Noble not present today works as a Technical sales engineer daughter-in-law Archie Patten  is a homemaker and homeschools her 3 children. The patient attends the [facility] maintenance:  She reports that she has quit smoking. She has never used smokeless tobacco. She reports that she drinks alcohol. She reports that she does not use illicit drugs.  Cholesterol treated with diet  Bone density normal bone density 2005.  Scheduled prior to visit with Dr. Darnelle Catalan in early April.  Colonoscopy: last colonoscopy 2003.  Arranged by Dr. Juanetta Gosling  (PAP) patient no longer has Pap smears being status post hysterectomy   Medications:  Current Outpatient Prescriptions  Medication Sig Dispense Refill  . calcium citrate-vitamin D 200-200 MG-UNIT TABS Take 1 tablet by mouth daily.        Marland Kitchen letrozole (FEMARA) 2.5 MG tablet Take 2.5 mg by mouth daily.         Vitamin E 400 unit capsule Take oone capsule by mouth daily.    . Lisinopril 10 mg tablet Take 10 mg by mouth daily        Objective:   Filed Vitals:   09/12/12 1428  BP: 165/81  Pulse: 80  Temp: 97.7 F (36.5 C)  Resp: 20   Physical Exam:  General: Well developed, well nourished female in no acute distress. Alert and oriented x 3. HEENT: Sclerae  anicteric.  PERRLA.  EOM intact.  No pallor.  Oropharynx clear.  Neck: Supple without any enlargements.  Lymph node survey: No cervical or supraclavicular adenopathy  Cardiovascular: Regular rate and rhythm. S1 and S2 normal.  Lungs: Clear to auscultation bilaterally. No wheezes/crackles/rhonchi noted.  Skin: No rashes or lesions present. Back: No CVA or spinal tenderness  Musculo:  Equal grip strength bilaterally, muscle strength and tone 5/5 bilaterally.  No evidence of atrophy/hypertrophy.  Arms & legs symmetric.  No edema or tenderness.  Joints non-tender, without swelling, and with full ROM except for limited flexion and abduction of the right shoulder. Abdomen: Abdomen soft, non-tender and obese. Active bowel sounds in all quadrants. No evidence of abdominal masses or organmegaly.  Breast: Well healed incision to the right breast status post mastectomy.  Area of concern without masses or nodularity.  No erythema, swelling, or tenderness.  No adenopathy in the right axilla. The left breast is unremarkable. Extremities: No bilateral cyanosis or clubbing.  No edema noted.  No evidence of lymphedema.  Lab Results: CMP      Component Value Date/Time   NA 138 09/12/2012 1354   NA 142 12/20/2011 0803   K 4.0 09/12/2012 1354   K 5.1 12/20/2011 0803   CL 106 09/12/2012 1354   CL 106 12/20/2011 0803   CO2 23 09/12/2012 1354   CO2 27 12/20/2011 0803   GLUCOSE 96 09/12/2012 1354   GLUCOSE 97 12/20/2011 0803   BUN 11.0 09/12/2012 1354   BUN 17 12/20/2011 0803   CREATININE 0.8 09/12/2012 1354   CREATININE 0.92 12/20/2011 0803   CALCIUM 10.0 09/12/2012 1354   CALCIUM 10.8* 12/20/2011 0803   PROT 7.6 09/12/2012 1354   PROT 7.0 12/20/2011 0803   ALBUMIN 3.7 09/12/2012 1354   ALBUMIN 4.4 12/20/2011 0803   AST 19 09/12/2012 1354   AST 16 12/20/2011 0803   ALT 13 09/12/2012 1354   ALT 16 12/20/2011 0803   ALKPHOS 137 09/12/2012 1354   ALKPHOS 121* 12/20/2011 0803   BILITOT 0.24 09/12/2012 1354   BILITOT 0.3  12/20/2011 0803   GFRNONAA 62* 11/25/2011 0927   GFRAA 72* 11/25/2011 0927    CBC Lab Results  Component Value Date   WBC 7.2 09/12/2012   HGB 12.9 09/12/2012   HCT 37.8 09/12/2012   MCV 90.0 09/12/2012   PLT 274 09/12/2012   NEUTROABS 4.6 09/12/2012   CA 27-29 decreased from 78 to 53 pre-op.  CA 27.29 level drawn today- results pending.  CBC and Cmet results discussed.  Studies/Results: *RADIOLOGY REPORT*  MRI THORACIC SPINE WITHOUT AND WITH CONTRAST  Comparison: 07/30/2011 thoracic MRI. Whole body bone scan 07/14/2011. Findings: Limited sagittal imaging of the cervical spine remarkable for spondylosis. At the inferior C7 vertebral body there is anterior and inferior endplate marrow edema and enhancement which is stable or decreased since November, likely degenerative. Thoracic spine marrow signal remains normal outside of the T8 vertebra. Thoracic vertebral height and alignment is stable. The T8 level shows continued, marrow edema along the left aspect of the body with evidence of an diagonal fracture plane. On sagittal images the degree of marrow edema and enhancement appears slightly decreased while on axial images the lesion to appears stable or decreased in size. No extension into the left pedicle. No extra osseous extension. No loss of vertebral body height. Minimal marrow edema previously seen at the T7 inferior endplate has resolved. Occasional small thoracic disc protrusions are noted, including at T1-T2 posteriorly. These slightly narrow the ventral CSF space without thoracic spinal stenosis. Spinal cord signal is within normal limits at all visualized levels. Normal conus at to T12-L1. No abnormal intradural enhancement. Visualized paraspinal soft tissues are within normal limits. Stable visualized thoracic and upper abdominal viscera; mild atelectasis suspected.  IMPRESSION: 1. Stable to mildly decreased marrow edema and enhancement at the T8 level. In the absence of specific treatment to  this lesion, and considering no new thoracic lesions, benign etiology is favored. No interval vertebral body collapse. 2. Interval stable or decreased mild marrow edema at the C7 inferior endplate, compatible with degenerative etiology. 3. No new abnormality in the thoracic spine.  Original Report Authenticated By: Harley Hallmark, M.D.  Assessment: 72 year-old [place] woman with a stage IIIA invasive lobular breast cancer  (1) status post  right breast biopsy October 2012 for a clinical T2 NX invasive lobular carcinoma, estrogen and progesterone receptor positive at 99% and 23%, HER-2 not amplified, with an MIB-1 of 32%  (2) treated neoadjuvantly with letrozole since the first week in November, with evidence of response   (3) status-post right lumpectomy and sentinel lymph node sampling 11/30/2011 for a T2 N2, stage IIIA invasive lobular breast cancer, again HER-2 negative, with positive margins  (4) status-post right mastectomy 12/28/2011 showing minimal residual disease in the breast, with an additional lymph node positive for tumor (total 5/5) but cleared margins  Plan:  She is to continue letrozole therapy for a minimum of 5 years and return to see Dr. Darnelle Catalan in early April of 2014.  Prior to her visit in April, she is to have a bone scan, bone density test, chest xray, and lab work.  She will be due for a mammogram in October of 2014.  She is to follow up with Dr. Daphine Deutscher as scheduled.  Physical therapy was recommended to increase range of motion with the RUE but the patient and husband refuse at this time.  They report that they would like to resume their previous exercise regimen and assess for improvement after that.  It is recommended for Mrs. Hodges to follow up with her primary care physician about the recent onset of a dry cough with a potential association with lisinopril administration.  Dr. Darnelle Catalan recommended that she could discontinue the medication prior to seeing Dr. Juanetta Gosling to  see if the cough resolves.  She was cautioned to monitor her blood pressure since she was 165/81 today with her lisinopril taken this am.  Reportable signs and symptoms reviewed.  Dr. Darnelle Catalan stated that she is cleared to proceed with cataract surgery in the near future.  The patient and husband were instructed to monitor her short-term memory loss and to report worsening or persistent symptoms.  Dr. Darnelle Catalan is aware and concurs with the above since no neurological abnormalities can be identified at this time.  She instructed to call for any questions or concerns.    Carolyn Maniscalco DEAL 09/12/2012

## 2012-09-12 NOTE — Telephone Encounter (Signed)
Labs, bone scan, bone density, and chest xray prior to appt with Dr. Darnelle Catalan in early April  Gave patient appointment for md   Gave patient instructions on getting her scans scheduled

## 2012-09-12 NOTE — Patient Instructions (Addendum)
Follow up in early April with Dr. Darnelle Catalan.  Plan to have a bone scan, bone density scan, chest xray, and lab work prior to your visit.  Continue Letrozole as ordered.  Proceed with cataract surgery and follow up with your primary care physician about recent onset of cough with taking Lisinopril.  Please call if you would like to begin physical therapy to increase your range of motion with your left arm.

## 2012-09-13 ENCOUNTER — Telehealth: Payer: Self-pay | Admitting: Gynecologic Oncology

## 2012-09-13 ENCOUNTER — Telehealth: Payer: Self-pay | Admitting: *Deleted

## 2012-09-13 NOTE — Telephone Encounter (Signed)
Spoke with the patient's husband.  Informed of her CA 27.29 results of 39.  No concerns voiced.  Instructed to call for any needs.

## 2012-09-13 NOTE — Telephone Encounter (Signed)
Labs, bone scan, bone density, and chest xray prior to appt with Dr. Darnelle Catalan in early April

## 2012-09-21 ENCOUNTER — Other Ambulatory Visit: Payer: Self-pay | Admitting: *Deleted

## 2012-09-21 DIAGNOSIS — C50919 Malignant neoplasm of unspecified site of unspecified female breast: Secondary | ICD-10-CM

## 2012-09-21 MED ORDER — LETROZOLE 2.5 MG PO TABS: 2.5000 mg | ORAL_TABLET | Freq: Every day | ORAL | Status: DC

## 2012-09-28 ENCOUNTER — Encounter (INDEPENDENT_AMBULATORY_CARE_PROVIDER_SITE_OTHER): Payer: Self-pay | Admitting: Surgery

## 2012-09-28 ENCOUNTER — Ambulatory Visit (INDEPENDENT_AMBULATORY_CARE_PROVIDER_SITE_OTHER): Payer: Medicare Other | Admitting: Surgery

## 2012-09-28 VITALS — BP 140/90 | HR 64 | Temp 97.2°F | Resp 14 | Ht 64.0 in | Wt 161.0 lb

## 2012-09-28 DIAGNOSIS — Z853 Personal history of malignant neoplasm of breast: Secondary | ICD-10-CM

## 2012-09-28 DIAGNOSIS — Z9889 Other specified postprocedural states: Secondary | ICD-10-CM

## 2012-09-28 NOTE — Progress Notes (Signed)
Shelly Willis 72 y.o.  Body mass index is 27.64 kg/(m^2).  Patient Active Problem List  Diagnosis  . Malignant neoplasm of upper-outer quadrant of female breast  . Status post right breast mastectomy  . Radiation dermatitis    Allergies  Allergen Reactions  . Vesicare (Solifenacin Succinate)     Headaches and upset stomach     Past Surgical History  Procedure Date  . Cholecystectomy 1993  . Tubal ligation 1977  . Bladder surgery 2011    growth attached to bladder stem   . Bilateral salpingoophorectomy   . Ruptured disc surgery 1988 1988  . Kidney stones   . Modified mastectomy 12/28/2011    Procedure: MODIFIED MASTECTOMY;  Surgeon: Valarie Merino, MD;  Location: [facility];  Service: General;  Laterality: Right;  Right modified mastectomy/   . Breast surgery 12/01/2011    Right breast lumpectomy  . Needle core biopsy 11/02/11    Left Breast Needle Core Biopsy, 9'Oclocl, No Malignancy: Biospy Axilla - Mammary Carcinoma, ER/PR Positive, Low Ki67  . Needle core biopsy 06/16/11    Right Breast - Invasive Mammary with Lobular features  . Abdominal hysterectomy 1997    complete   HAWKINS,EDWARD L, MD No diagnosis found.  Doing well.  Concern directed at a spot along her right pectoral muscle margin.  I don't feel a worrisome nodule.  Flaps have otherwise healed and are tanned.  Left breast shows no palpable masses or nipple discharges.  Will plan to recheck her in May after her bone scan in April.   Matt B. Daphine Deutscher, MD, [facility] [phone] beeper [phone]  09/28/2012 9:24 AM

## 2012-11-28 ENCOUNTER — Encounter (HOSPITAL_COMMUNITY)
Admission: RE | Admit: 2012-11-28 | Discharge: 2012-11-28 | Disposition: A | Discharge status: Home / Self Care | Payer: Medicare Other | Source: Ambulatory Visit | Attending: Gynecologic Oncology | Admitting: Gynecologic Oncology

## 2012-11-28 ENCOUNTER — Telehealth: Payer: Self-pay | Admitting: Gynecologic Oncology

## 2012-11-28 ENCOUNTER — Ambulatory Visit
Admission: RE | Admit: 2012-11-28 | Discharge: 2012-11-28 | Disposition: A | Discharge status: Home / Self Care | Payer: Medicare Other | Source: Ambulatory Visit | Attending: Gynecologic Oncology | Admitting: Gynecologic Oncology

## 2012-11-28 DIAGNOSIS — M899 Disorder of bone, unspecified: Secondary | ICD-10-CM | POA: Insufficient documentation

## 2012-11-28 DIAGNOSIS — Z853 Personal history of malignant neoplasm of breast: Secondary | ICD-10-CM

## 2012-11-28 DIAGNOSIS — I10 Essential (primary) hypertension: Secondary | ICD-10-CM | POA: Insufficient documentation

## 2012-11-28 DIAGNOSIS — Z87891 Personal history of nicotine dependence: Secondary | ICD-10-CM | POA: Insufficient documentation

## 2012-11-28 DIAGNOSIS — M949 Disorder of cartilage, unspecified: Secondary | ICD-10-CM | POA: Insufficient documentation

## 2012-11-28 DIAGNOSIS — C50919 Malignant neoplasm of unspecified site of unspecified female breast: Secondary | ICD-10-CM | POA: Insufficient documentation

## 2012-11-28 DIAGNOSIS — R079 Chest pain, unspecified: Secondary | ICD-10-CM | POA: Insufficient documentation

## 2012-11-28 MED ORDER — TECHNETIUM TC 99M MEDRONATE IV KIT
24.6000 | PACK | Freq: Once | INTRAVENOUS | Status: AC | PRN
  Administered 2012-11-28: 24.6 via INTRAVENOUS

## 2012-11-28 NOTE — Telephone Encounter (Signed)
Message left with bone density scan results.  Instructed to call the office for any questions or concerns.

## 2012-12-04 ENCOUNTER — Other Ambulatory Visit (HOSPITAL_BASED_OUTPATIENT_CLINIC_OR_DEPARTMENT_OTHER): Payer: Medicare Other | Admitting: Lab

## 2012-12-04 ENCOUNTER — Ambulatory Visit (HOSPITAL_BASED_OUTPATIENT_CLINIC_OR_DEPARTMENT_OTHER): Payer: Medicare Other | Admitting: Oncology

## 2012-12-04 ENCOUNTER — Telehealth: Payer: Self-pay | Admitting: *Deleted

## 2012-12-04 VITALS — BP 159/74 | HR 83 | Temp 98.3°F | Resp 20 | Ht 64.0 in | Wt 164.5 lb

## 2012-12-04 DIAGNOSIS — Z853 Personal history of malignant neoplasm of breast: Secondary | ICD-10-CM

## 2012-12-04 DIAGNOSIS — C50919 Malignant neoplasm of unspecified site of unspecified female breast: Secondary | ICD-10-CM

## 2012-12-04 DIAGNOSIS — Z170 Estrogen receptor positive status [ER+]: Secondary | ICD-10-CM

## 2012-12-04 DIAGNOSIS — C50419 Malignant neoplasm of upper-outer quadrant of unspecified female breast: Secondary | ICD-10-CM

## 2012-12-04 DIAGNOSIS — M549 Dorsalgia, unspecified: Secondary | ICD-10-CM

## 2012-12-04 LAB — CBC WITH DIFFERENTIAL/PLATELET
BASO%: 0.3 % (ref 0.0–2.0)
Eosinophils Absolute: 0.1 10*3/uL (ref 0.0–0.5)
LYMPH%: 29.8 % (ref 14.0–49.7)
MCHC: 32.4 g/dL (ref 31.5–36.0)
MONO#: 0.6 10*3/uL (ref 0.1–0.9)
NEUT#: 3.6 10*3/uL (ref 1.5–6.5)
Platelets: 319 10*3/uL (ref 145–400)
RBC: 4.33 10*6/uL (ref 3.70–5.45)
RDW: 14.2 % (ref 11.2–14.5)
WBC: 6.3 10*3/uL (ref 3.9–10.3)
lymph#: 1.9 10*3/uL (ref 0.9–3.3)

## 2012-12-04 LAB — COMPREHENSIVE METABOLIC PANEL (CC13)
ALT: 17 U/L (ref 0–55)
Albumin: 3.9 g/dL (ref 3.5–5.0)
CO2: 26 mEq/L (ref 22–29)
Calcium: 10.9 mg/dL — ABNORMAL HIGH (ref 8.4–10.4)
Chloride: 105 mEq/L (ref 98–107)
Glucose: 93 mg/dl (ref 70–99)
Potassium: 4.8 mEq/L (ref 3.5–5.1)
Sodium: 141 mEq/L (ref 136–145)
Total Bilirubin: 0.39 mg/dL (ref 0.20–1.20)
Total Protein: 7.7 g/dL (ref 6.4–8.3)

## 2012-12-04 MED ORDER — LETROZOLE 2.5 MG PO TABS: 2.5000 mg | ORAL_TABLET | Freq: Every day | ORAL | Status: DC

## 2012-12-04 NOTE — Telephone Encounter (Signed)
appts made and printed 

## 2012-12-04 NOTE — Progress Notes (Signed)
ID: Shelly Willis  HPI:  The patient had routine screening mammography 04/21/2011. This showed a possible distortion in the right breast. The patient was brought back for additional views October 3 which confirmed the area of distortion. Ultrasound showed a 1.8 cm suspicious area and this was biopsied October 17, showing [facility] [id number]) an invasive mammary carcinoma with lobular features. This was strongly estrogen and progesterone receptor positive, HER-2 negative, with an elevated MIB-1. The patient was then referred to Dr. Wenda Low and bilateral breast MRIs were obtained October 22 confirming a 2.4 cm mass in the upper right breast at the 12:00 position. There was some right nipple retraction and skin thickening but this was felt to be secondary to post biopsy change. There were no other suspicious areas of concern. Specifically there was no evidence of axillary or internal mammary adenopathy. She was treated neoadjuvantly with letrozole, with her further breast cancer history as detailed below.     Interval History: Alonnah returns today with her husband [name] for followup of her breast cancer. The interval history is generally stable. She is "not doing much", although when she does make it to Silver sneakers she generally feels better. She is tolerating the letrozole with mild hot flashes as the only side effect. She can get a month supply for $6.  ROS:  She has significant pain in the right wrist and hand. She also has low back pain, which is a bit worse when she bends 4, for example to watch her here. She still has a little bit of a cough but that improved out when her blood pressure medication was changed, and she has had no fever, shortness of breath, pleurisy, or purulent sputum. A detailed review of systems today was otherwise stable.  Past Medical History   Diagnosis  Date   .  Cancer      breast   .  Nasal congestion    .  Sore throat    .  Cataract    .  Hyperlipidemia      Past Surgical History   Procedure  Date   .  Cholecystectomy  1993   .  Tubal ligation  1977   .  Back surgery  1987     ruptured disc   .  Abdominal hysterectomy  1997   .  Bladder surgery  2011     growth attached to bladder stem    Family history: The patient's father died in an automobile accident at the age of 28. The patient's mother died in her mid-80s status post stroke the patient has one brother and one sister in fair health there is no history of breast or ovarian cancer in the family   Gynecologic history: Menarche age 73 hysterectomy age 42 the patient took hormone replacement approximately 20 years. 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 Her husband [name] is present today; they have been married 51 years; he is a retired Acupuncturist. Son Raiford Noble not present today works as a Technical sales engineer daughter-in-law Archie Patten  is a homemaker and homeschools her 3 children. The patient attends the [employer]   Advanced directives: In place  Health maintenance:  reports that she has quit smoking. She has never used smokeless tobacco. She reports that she drinks alcohol. She reports that she does not use illicit drugs.  Cholesterol treated  with diet  Bone density normal bone density 2005  Colonoscopy last colonoscopy 2003  (PAP) patient no longer has Pap smears being status post hysterectomy   Medications:  Current Outpatient Prescriptions  Medication Sig Dispense Refill  . calcium citrate-vitamin D 200-200 MG-UNIT TABS Take 1 tablet by mouth daily.        Marland Kitchen letrozole (FEMARA) 2.5 MG tablet Take 2.5 mg by mouth daily.        .           Objective: Middle-aged white woman in no acute distress  Filed Vitals:   12/04/12 0959  BP: 159/74  Pulse: 83  Temp: 98.3 F (36.8 C)  Resp: 20   ECOG: 1  Physical Exam:    Sclerae  unicteric  Oropharynx clear  No cervical or supraclavicular adenopathy,   Lungs clear -- no rales or rhonchi with special attention to the right upper lobe  Heart regular rate and rhythm  Abdomen benign  MSK no focal spinal tenderness to palpation and percussion with special attention to the mid thoracic spine area; no peripheral edema  Neuro nonfocal  Breast exam: The right breast is status post mastectomy. There is no evidence of local recurrence. The right axilla is benign. The left breast is unremarkable.   Lab Results: CMP      Component Value Date/Time   NA 141 12/04/2012 0931   NA 142 12/20/2011 0803   K 4.8 12/04/2012 0931   K 5.1 12/20/2011 0803   CL 105 12/04/2012 0931   CL 106 12/20/2011 0803   CO2 26 12/04/2012 0931   CO2 27 12/20/2011 0803   GLUCOSE 93 12/04/2012 0931   GLUCOSE 97 12/20/2011 0803   BUN 11.3 12/04/2012 0931   BUN 17 12/20/2011 0803   CREATININE 1.0 12/04/2012 0931   CREATININE 0.92 12/20/2011 0803   CALCIUM 10.9* 12/04/2012 0931   CALCIUM 10.8* 12/20/2011 0803   PROT 7.7 12/04/2012 0931   PROT 7.0 12/20/2011 0803   ALBUMIN 3.9 12/04/2012 0931   ALBUMIN 4.4 12/20/2011 0803   AST 18 12/04/2012 0931   AST 16 12/20/2011 0803   ALT 17 12/04/2012 0931   ALT 16 12/20/2011 0803   ALKPHOS 146 12/04/2012 0931   ALKPHOS 121* 12/20/2011 0803   BILITOT 0.39 12/04/2012 0931   BILITOT 0.3 12/20/2011 0803   GFRNONAA 62* 11/25/2011 0927   GFRAA 72* 11/25/2011 0927    CBC Lab Results  Component Value Date   WBC 6.3 12/04/2012   HGB 13.2 12/04/2012   HCT 40.8 12/04/2012   MCV 94.2 12/04/2012   PLT 319 12/04/2012   NEUTROABS 3.6 12/04/2012     Studies/Results: Dg Chest 2 View  11/28/2012  *RADIOLOGY REPORT*  Clinical Data: Right-sided chest pain, breast cancer  CHEST - 2 VIEW  Comparison: CT scan of July 14, 2011.  Findings: Cardiomediastinal silhouette appears normal.  Left lung is clear.  There is interval development of wedge-shaped opacity involving the right lung apex which may represent  pneumonia or scarring.  Surgical clips are noted in right axillary region.  IMPRESSION: Compared to prior CT scan, there is interval development of wedge- shaped opacity in the right lung apex concerning for pneumonia or scarring.  Follow-up radiographs are recommended to ensure stability or resolution.   Original Report Authenticated By: Lupita Raider.,  M.D.    Nm Bone Scan Whole Body  11/28/2012  *RADIOLOGY REPORT*  Clinical Data: Breast cancer, question recurrence, bone pain in right chest under arm  NUCLEAR MEDICINE WHOLE BODY BONE SCINTIGRAPHY  Technique:  Whole body anterior and posterior images were obtained approximately 3 hours after intravenous injection of radiopharmaceutical.  Radiopharmaceutical: 24.6MILLI CURIE TC-MDP TECHNETIUM TC 65M MEDRONATE IV KIT  Comparison: 07/14/2011 Correlation:  Chest radiograph 11/28/2012  Findings: Uptake identified at right sternoclavicular joint, typically degenerative. Decreased tracer accumulation is seen at previously noted lateral right cervical and left lumbar sites. Decreased tracer accumulation at the site in mid thoracic spine versus previous study. No additional sites of abnormal osseous tracer accumulation identified. Expected urinary tract and soft tissue distribution of tracer.  IMPRESSION: Decreased tracer accumulation at multiple sites noted on the previous exam, most notably the mid thoracic spine site question treated metastasis. No new worrisome sites of abnormal tracer accumulation identified to suggest new osseous metastases.   Original Report Authenticated By: Ulyses Southward, M.D.    Dg Bone Density  11/28/2012  *RADIOLOGY REPORT*  Clinical Data: 72 year old postmenopausal female with history of breast cancer and high blood pressure.  The patient takes calcium with vitamin D and Letrozole.  She stopped taking hormones in March 2013.  DUAL X-RAY ABSORPTIOMETRY (DXA) FOR BONE MINERAL DENSITY  AP LUMBAR SPINE (L1 - L4)  Bone Mineral Density (BMD):             0.976 g/cm2 Young Adult T Score:                          -0.6 Z Score:                                                1.6  LEFT FEMUR (NECK)  Bone Mineral Density (BMD):             0.864 g/cm2 Young Adult T Score:                           0.1 Z Score:                                                 2.0  ASSESSMENT:  Patient's diagnostic category is NORMAL by WHO Criteria.  FRACTURE RISK: NOT INCREASED  FRAX: Not applicable  Comparison: None.  Please note that it is not possible to compare data from different instruments.  RECOMMENDATIONS:  Effective therapies are available in the form of bisphosphonates, selective estrogen receptor modulators, biologic agents, and hormone replacement therapy (for women).  All patients should ensure an adequate intake of dietary calcium (1200mg  daily) and vitamin D (800 IU daily) unless contraindicated.  All treatment decisions require clinical judgement and consideration of individual patient factors, including patient preferences, co-morbidities, previous drug use, risk factors not captured in the FRAX model (e.g., frailty, falls, vitamin D deficiency, increased bone turnover, interval significant decline in bone density) and possible under-or over-estimation of fracture risk by FRAX.  The National Osteoporosis Foundation recommends that FDA-approved medical therapies be considered in postmenopausal women and mean age 71 or older with a:        1)     Hip or vertebral (clinical or morphometric) fracture.           2)  T-score of -2.5 or lower at the spine or hip. 3)    Ten-year fracture probability by FRAX of 3% or greater for hip fracture or 20% or greater for major osteoporotic fracture. FOLLOW-UP:  People with diagnosed cases of osteoporosis or at high risk for fracture should have regular bone mineral density tests.  For patients eligible for Medicare, routine testing is allowed once every 2 years.  The testing frequency can be increased to one year for patients who  have rapidly progressing disease, those who are receiving or discontinuing medical therapy to restore bone mass, or have additional risk factors.  World Science writer [facility]) Criteria:  Normal: T scores from +1.0 to -1.0 Low Bone Mass (Osteopenia): T scores between -1.0 and -2.5 Osteoporosis: T scores -2.5 and below  Comparison to Reference Population:  T score is the key measure used in the diagnosis of osteoporosis and relative risk determination for fracture.  It provides a value for bone mass relative to the mean bone mass of a young adult reference population expressed in terms of standard deviation (SD).  Z score is the age-matched score showing the patient's values compared to a population matched for age, sex, and race.  This is also expressed in terms of standard deviation.  The patient may have values that compare favorably to the age-matched values and still be at increased risk for fracture.   Original Report Authenticated By: Cain Saupe, M.D.    Assessment: 72 y.o.  [place] woman with a stage IIIA invasive lobular breast cancer  (1) status post right breast biopsy October 2012 for a clinical T2 NX invasive lobular carcinoma, estrogen and progesterone receptor positive at 99% and 23%, HER-2 not amplified, with an MIB-1 of 32%  (2) treated neoadjuvantly with letrozole as of the first week in November, with evidence of response   (3) status-post right lumpectomy and sentinel lymph node sampling 11/30/2011 for a T2 N2, stage IIIA invasive lobular breast cancer, again HER-2 negative, with positive margins  (4) status-post right mastectomy 12/28/2011 showing minimal residual disease in the breast, with an additional lymph node positive for tumor (total 5/5) but cleared margins  (5) bone scan November 2012 was read as suspicious for metastatic disease in the bone, with MRI showing a suspicious lesion at T8, with repeat thoracic spine MRI April 2013 and bone scan April 2014 showing no  new lesions and significant decrease in uptake as compared to prior.   Plan: We discussed her situation at length today. It could be that Vickie has bone only stage IV disease, but if she does it is being treated successfully. I offered to obtain plain films or repeat MRI of her lumbar spine, since she seems to have increased pain there, but she just wanted to "see how it goes". I also offered her referral to a hand specialist for what may be carpal tunnel syndrome at the right wrist, but she also does not want that at this point. She will take ibuprofen as needed for pain.  From my point of view it is very favorable that she is tolerating the letrozole with no significant side effects and that the bone density showed no osteopenia or osteoporosis. The plan is to continue letrozole. I am not sure what the finding on the chest x-ray is, except to say that clinically it does not seem to me to be either cancer or pneumonia or a "Homan's hump". We will repeat a chest x-ray when she returns to see me in 6  months. She knows to call for any problems that may develop before the next visit.   Gelena Klosinski C 12/04/2012

## 2012-12-14 ENCOUNTER — Telehealth (INDEPENDENT_AMBULATORY_CARE_PROVIDER_SITE_OTHER): Payer: Self-pay | Admitting: General Surgery

## 2012-12-14 NOTE — Telephone Encounter (Signed)
Patient's husband called to see if we could send an updated RX to [facility] for bras and prosthesis post right mastectomy approximately one year ago. RX written and given to Dr Ermalene Searing assistant so it can be signed and faxed to [facility] at [phone].

## 2012-12-28 ENCOUNTER — Encounter (INDEPENDENT_AMBULATORY_CARE_PROVIDER_SITE_OTHER): Payer: Self-pay | Admitting: Surgery

## 2012-12-28 ENCOUNTER — Ambulatory Visit (INDEPENDENT_AMBULATORY_CARE_PROVIDER_SITE_OTHER): Payer: Medicare Other | Admitting: Surgery

## 2012-12-28 VITALS — BP 152/88 | HR 72 | Temp 98.0°F | Resp 16 | Ht 64.0 in | Wt 164.5 lb

## 2012-12-28 DIAGNOSIS — Z9889 Other specified postprocedural states: Secondary | ICD-10-CM

## 2012-12-28 DIAGNOSIS — Z853 Personal history of malignant neoplasm of breast: Secondary | ICD-10-CM

## 2012-12-28 NOTE — Progress Notes (Signed)
Followup note for [name]. Who comes in with her husband. Her right chest was inspected and her flaps are smooth. I examined the little bump between her ribs anteriorly and this feels like a fatty mass. There is no skin retraction and does not appear fixed. It has not grown since I saw in January. Axilla is without masses. Minimal swelling in right arm. She is having some issues with her right wrist probably related to typing and chronic injury.  I reviewed her x-ray from January and you can see the overlap on from her pectoralis major muscle.  Left breast is without masses or discharges. No axillary problems. She does have an actinic keratosis superior medially. She's having some chronic low back pain but nothing else on her. Renaldo Harrison is scheduled to see her in 6 months. I'll see her in 3 months and then we'll chart again rotation where she is seeing one of her doctors Periodically.  Return 3 months.

## 2013-01-04 ENCOUNTER — Emergency Department (HOSPITAL_COMMUNITY): Payer: Medicare Other

## 2013-01-04 ENCOUNTER — Emergency Department (HOSPITAL_COMMUNITY)
Admission: EM | Admit: 2013-01-04 | Discharge: 2013-01-04 | Disposition: A | Discharge status: Home / Self Care | Payer: Medicare Other | Attending: Emergency Medicine | Admitting: Emergency Medicine

## 2013-01-04 ENCOUNTER — Encounter (HOSPITAL_COMMUNITY): Payer: Self-pay

## 2013-01-04 DIAGNOSIS — Z853 Personal history of malignant neoplasm of breast: Secondary | ICD-10-CM | POA: Insufficient documentation

## 2013-01-04 DIAGNOSIS — W19XXXA Unspecified fall, initial encounter: Secondary | ICD-10-CM | POA: Insufficient documentation

## 2013-01-04 DIAGNOSIS — W1781XA Fall down embankment (hill), initial encounter: Secondary | ICD-10-CM

## 2013-01-04 DIAGNOSIS — Z8639 Personal history of other endocrine, nutritional and metabolic disease: Secondary | ICD-10-CM | POA: Insufficient documentation

## 2013-01-04 DIAGNOSIS — Z8669 Personal history of other diseases of the nervous system and sense organs: Secondary | ICD-10-CM | POA: Insufficient documentation

## 2013-01-04 DIAGNOSIS — Y939 Activity, unspecified: Secondary | ICD-10-CM | POA: Insufficient documentation

## 2013-01-04 DIAGNOSIS — Z8679 Personal history of other diseases of the circulatory system: Secondary | ICD-10-CM | POA: Insufficient documentation

## 2013-01-04 DIAGNOSIS — IMO0002 Reserved for concepts with insufficient information to code with codable children: Secondary | ICD-10-CM | POA: Insufficient documentation

## 2013-01-04 DIAGNOSIS — Z87891 Personal history of nicotine dependence: Secondary | ICD-10-CM | POA: Insufficient documentation

## 2013-01-04 DIAGNOSIS — Z862 Personal history of diseases of the blood and blood-forming organs and certain disorders involving the immune mechanism: Secondary | ICD-10-CM | POA: Insufficient documentation

## 2013-01-04 DIAGNOSIS — Z87448 Personal history of other diseases of urinary system: Secondary | ICD-10-CM | POA: Insufficient documentation

## 2013-01-04 DIAGNOSIS — Z79899 Other long term (current) drug therapy: Secondary | ICD-10-CM | POA: Insufficient documentation

## 2013-01-04 DIAGNOSIS — Y929 Unspecified place or not applicable: Secondary | ICD-10-CM | POA: Insufficient documentation

## 2013-01-04 DIAGNOSIS — S0081XA Abrasion of other part of head, initial encounter: Secondary | ICD-10-CM

## 2013-01-04 LAB — URINALYSIS, ROUTINE W REFLEX MICROSCOPIC
Glucose, UA: NEGATIVE mg/dL
Ketones, ur: NEGATIVE mg/dL
Protein, ur: 30 mg/dL — AB
Urobilinogen, UA: 0.2 mg/dL (ref 0.0–1.0)

## 2013-01-04 LAB — CBC WITH DIFFERENTIAL/PLATELET
Basophils Absolute: 0 K/uL (ref 0.0–0.1)
Basophils Relative: 0 % (ref 0–1)
Eosinophils Absolute: 0 K/uL (ref 0.0–0.7)
Eosinophils Relative: 0 % (ref 0–5)
HCT: 40.8 % (ref 36.0–46.0)
Hemoglobin: 13.7 g/dL (ref 12.0–15.0)
Lymphocytes Relative: 11 % — ABNORMAL LOW (ref 12–46)
Lymphs Abs: 1.5 K/uL (ref 0.7–4.0)
MCH: 31.2 pg (ref 26.0–34.0)
MCHC: 33.6 g/dL (ref 30.0–36.0)
MCV: 92.9 fL (ref 78.0–100.0)
Monocytes Absolute: 0.8 K/uL (ref 0.1–1.0)
Monocytes Relative: 5 % (ref 3–12)
Neutro Abs: 12.3 K/uL — ABNORMAL HIGH (ref 1.7–7.7)
Neutrophils Relative %: 84 % — ABNORMAL HIGH (ref 43–77)
Platelets: 297 K/uL (ref 150–400)
RBC: 4.39 MIL/uL (ref 3.87–5.11)
RDW: 14 % (ref 11.5–15.5)
WBC: 14.6 K/uL — ABNORMAL HIGH (ref 4.0–10.5)

## 2013-01-04 LAB — COMPREHENSIVE METABOLIC PANEL WITH GFR
ALT: 18 U/L (ref 0–35)
AST: 23 U/L (ref 0–37)
Albumin: 4.3 g/dL (ref 3.5–5.2)
Alkaline Phosphatase: 153 U/L — ABNORMAL HIGH (ref 39–117)
BUN: 16 mg/dL (ref 6–23)
CO2: 26 meq/L (ref 19–32)
Calcium: 10.7 mg/dL — ABNORMAL HIGH (ref 8.4–10.5)
Chloride: 101 meq/L (ref 96–112)
Creatinine, Ser: 1.01 mg/dL (ref 0.50–1.10)
GFR calc Af Amer: 63 mL/min — ABNORMAL LOW
GFR calc non Af Amer: 55 mL/min — ABNORMAL LOW
Glucose, Bld: 132 mg/dL — ABNORMAL HIGH (ref 70–99)
Potassium: 4.3 meq/L (ref 3.5–5.1)
Sodium: 140 meq/L (ref 135–145)
Total Bilirubin: 0.3 mg/dL (ref 0.3–1.2)
Total Protein: 8.1 g/dL (ref 6.0–8.3)

## 2013-01-04 LAB — GLUCOSE, CAPILLARY: Glucose-Capillary: 168 mg/dL — ABNORMAL HIGH (ref 70–99)

## 2013-01-04 LAB — URINE MICROSCOPIC-ADD ON

## 2013-01-04 NOTE — ED Notes (Signed)
Pt returned from ct scan, husband now at bedside.

## 2013-01-04 NOTE — ED Notes (Signed)
Pt was seen by bystanders, laying on the side of road.  Pt confused, unable to answer simple questions.   Pt is incont. Of urine.  Oriented to name only

## 2013-01-04 NOTE — ED Notes (Signed)
Pt arrived by ems from side of the road where she was found. Stated she went for a walk, unable to recall any events before fall.  "i went kinda fast and couldn't stop". c-collar placed in exam room, by denies any neck or back pain, no cp,  C/o right hand pain. When touched. Perrl.

## 2013-01-04 NOTE — ED Notes (Signed)
Pt did have abrasions to face and chin at arrival.

## 2013-01-04 NOTE — ED Provider Notes (Signed)
History     CSN: [id number]  Arrival date & time 01/04/13  1321   First MD Initiated Contact with Patient 01/04/13 1330      Chief Complaint  Patient presents with  . Fall  . Altered Mental Status    (Consider location/radiation/quality/duration/timing/severity/associated sxs/prior treatment) Patient is a 72 y.o. female presenting with fall and altered mental status. The history is provided by the EMS personnel. The history is limited by the condition of the patient (Altered mental status).  Fall  Altered Mental Status  She was found in a ditch by the side of the road and was noted to be incontinent of urine. She does not know what happened but states that she had been going for a walk. She is complaining of pain in her right hand.  Past Medical History  Diagnosis Date  . Cataract   . Hyperlipidemia   . History of neck surgery cervical laminectomy  . Urethral polyp removed AUG 2010  . Stress incontinence, female   . Migraines   . PONV (postoperative nausea and vomiting)   . H/O exercise stress test     done in PCP- office, maybe 15 yrs. ago   . Cancer     right breast  . Aromatase inhibitor use Nov. 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  Planned for "minimum of 5 years"  . Breast cancer 06/16/11    Right Breast     Past Surgical History  Procedure Laterality Date  . Cholecystectomy  1993  . Tubal ligation  1977  . Bladder surgery  2011    growth attached to bladder stem   . Bilateral salpingoophorectomy    . Ruptured disc surgery 1988  1988  . Kidney stones    . Modified mastectomy  12/28/2011    Procedure: MODIFIED MASTECTOMY;  Surgeon: Valarie Merino, MD;  Location: [facility];  Service: General;  Laterality: Right;  Right modified mastectomy/   . Breast surgery  12/01/2011    Right breast lumpectomy  . Needle core biopsy  11/02/11    Left Breast Needle Core  Biopsy, 9'Oclocl, No Malignancy: Biospy Axilla - Mammary Carcinoma, ER/PR Positive, Low Ki67  . Needle core biopsy  06/16/11    Right Breast - Invasive Mammary with Lobular features  . Abdominal hysterectomy  1997    complete    Family History  Problem Relation Age of Onset  . Cancer Neg Hx   . Stroke Mother     in mid 74's    History  Substance Use Topics  . Smoking status: Former Smoker -- 1.00 packs/day    Types: Cigarettes    Quit date: 07/07/1987  . Smokeless tobacco: Never Used  . Alcohol Use: 0.6 oz/week    1 Glasses of wine per week     Comment: socially    OB History   Grav Para Term Preterm Abortions TAB SAB Ect Mult Living   3 3             Obstetric Comments   Menarche age 56, Parity age 48, Gx, T68, Hysterectomy age 40, HRt ~ 20 years      Review of Systems  Unable to perform ROS: Mental status change  Psychiatric/Behavioral: Positive for altered mental status.    Allergies  Vesicare  Home Medications   Current Outpatient Rx  Name  Route  Sig  Dispense  Refill  .  calcium citrate-vitamin D 200-200 MG-UNIT TABS   Oral   Take 1 tablet by mouth 2 (two) times daily.          Marland Kitchen letrozole (FEMARA) 2.5 MG tablet   Oral   Take 1 tablet (2.5 mg total) by mouth daily.   90 tablet   12   . losartan (COZAAR) 100 MG tablet   Oral   Take 100 mg by mouth daily.         . Vitamin C-Vitamin E-Panthenol (VITAMIN E & C BEAUTY LOTION EX)   Apply externally   Apply topically.         . vitamin E 400 UNIT capsule   Oral   Take 400 Units by mouth daily.           BP 143/71  Pulse 91  Temp(Src) 97.6 F (36.4 C) (Oral)  Resp 18  SpO2 95%  Physical Exam  Nursing note and vitals reviewed.  71 year old female, resting comfortably and in no acute distress. Vital signs are significant for borderline hypertension with blood pressure 143/71. Oxygen saturation is 95%, which is normal. Head is normocephalic. Minor abrasions are seen on the forehead  and bridge of the nose without swelling or deformity. PERRLA, EOMI. Oropharynx is clear. Neck is immobilized in a stiff cervical collar and is nontender without adenopathy or JVD. Back is nontender and there is no CVA tenderness. Lungs are clear without rales, wheezes, or rhonchi. Chest is nontender. Heart has regular rate and rhythm without murmur. Abdomen is soft, flat, nontender without masses or hepatosplenomegaly and peristalsis is normoactive. Extremities have no cyanosis or edema, full range of motion is present. She complains of pain with range of motion of the right hand with some tenderness over the right second and third fingers. However, the tenderness is not consistent. There is no swelling or deformity. Skin is warm and dry without rash. Neurologic: She is awake and alert and oriented to person and place but not time, cranial nerves are intact, there are no motor or sensory deficits.  ED Course  Procedures (including critical care time)  Results for orders placed during the hospital encounter of 01/04/13  CBC WITH DIFFERENTIAL      Result Value Range   WBC 14.6 (*) 4.0 - 10.5 K/uL   RBC 4.39  3.87 - 5.11 MIL/uL   Hemoglobin 13.7  12.0 - 15.0 g/dL   HCT 47.8  29.5 - 62.1 %   MCV 92.9  78.0 - 100.0 fL   MCH 31.2  26.0 - 34.0 pg   MCHC 33.6  30.0 - 36.0 g/dL   RDW 30.8  65.7 - 84.6 %   Platelets 297  150 - 400 K/uL   Neutrophils Relative 84 (*) 43 - 77 %   Neutro Abs 12.3 (*) 1.7 - 7.7 K/uL   Lymphocytes Relative 11 (*) 12 - 46 %   Lymphs Abs 1.5  0.7 - 4.0 K/uL   Monocytes Relative 5  3 - 12 %   Monocytes Absolute 0.8  0.1 - 1.0 K/uL   Eosinophils Relative 0  0 - 5 %   Eosinophils Absolute 0.0  0.0 - 0.7 K/uL   Basophils Relative 0  0 - 1 %   Basophils Absolute 0.0  0.0 - 0.1 K/uL  COMPREHENSIVE METABOLIC PANEL      Result Value Range   Sodium 140  135 - 145 mEq/L   Potassium 4.3  3.5 - 5.1 mEq/L   Chloride  101  96 - 112 mEq/L   CO2 26  19 - 32 mEq/L   Glucose,  Bld 132 (*) 70 - 99 mg/dL   BUN 16  6 - 23 mg/dL   Creatinine, Ser 1.61  0.50 - 1.10 mg/dL   Calcium 09.6 (*) 8.4 - 10.5 mg/dL   Total Protein 8.1  6.0 - 8.3 g/dL   Albumin 4.3  3.5 - 5.2 g/dL   AST 23  0 - 37 U/L   ALT 18  0 - 35 U/L   Alkaline Phosphatase 153 (*) 39 - 117 U/L   Total Bilirubin 0.3  0.3 - 1.2 mg/dL   GFR calc non Af Amer 55 (*) >90 mL/min   GFR calc Af Amer 63 (*) >90 mL/min  GLUCOSE, CAPILLARY      Result Value Range   Glucose-Capillary 168 (*) 70 - 99 mg/dL  URINALYSIS, ROUTINE W REFLEX MICROSCOPIC      Result Value Range   Color, Urine YELLOW  YELLOW   APPearance CLEAR  CLEAR   Specific Gravity, Urine >1.030 (*) 1.005 - 1.030   pH 5.5  5.0 - 8.0   Glucose, UA NEGATIVE  NEGATIVE mg/dL   Hgb urine dipstick SMALL (*) NEGATIVE   Bilirubin Urine NEGATIVE  NEGATIVE   Ketones, ur NEGATIVE  NEGATIVE mg/dL   Protein, ur 30 (*) NEGATIVE mg/dL   Urobilinogen, UA 0.2  0.0 - 1.0 mg/dL   Nitrite NEGATIVE  NEGATIVE   Leukocytes, UA NEGATIVE  NEGATIVE  URINE MICROSCOPIC-ADD ON      Result Value Range   WBC, UA 0-2  <3 WBC/hpf   RBC / HPF 3-6  <3 RBC/hpf   Bacteria, UA FEW (*) RARE   Casts GRANULAR CAST (*) NEGATIVE   Dg Chest 2 View  01/04/2013  *RADIOLOGY REPORT*  Clinical Data: Altered mental status.  History of breast cancer diagnosed October, 2012.  CHEST - 2 VIEW  Comparison: PA and lateral chest 11/28/2012.  Findings: The patient is status post right mastectomy and axillary dissection.  Wedge shaped peripheral opacity in the right lung apex is again seen as on the prior study.  Left lung appears clear. Heart size is normal.  No pneumothorax or pleural fluid.  IMPRESSION:  1.  Wedge-shaped opacity in the right lung apex may be due to scarring/ post-treatment change but cannot be definitively characterized. 2.  No acute finding.   Original Report Authenticated By: Holley Dexter, M.D.    Ct Head Wo Contrast  01/04/2013  *RADIOLOGY REPORT*  Clinical Data:  Fall.   Altered mental status.  Swelling and abrasion to the mid forehead.  The patient is amnestic to the event.  CT HEAD WITHOUT CONTRAST CT MAXILLOFACIAL WITHOUT CONTRAST CT CERVICAL SPINE WITHOUT CONTRAST  Technique:  Multidetector CT imaging of the head, cervical spine, and maxillofacial structures were performed using the standard protocol without intravenous contrast. Multiplanar CT image reconstructions of the cervical spine and maxillofacial structures were also generated.  Comparison:  MRI brain 08/09/2011  CT HEAD  Findings: Extensive periventricular and subcortical white matter disease is similar to the prior exam a lacunar infarct of the right internal capsule is new from prior study, but does not appear acute.  Remote lacunar infarcts are noted in the cerebellum.  The ventricles are proportionate to the degree of atrophy.  There is no significant extra-axial fluid collection.  Soft tissue swelling is noted in the frontal scalp at the level of the frontal sinuses.  There is  no underlying fracture.  The paranasal sinuses and mastoid air cells are clear.  The osseous skull is intact.  Atherosclerotic calcifications are present in the cavernous carotid arteries.  IMPRESSION:  1.  Mild soft tissue swelling in the frontal scalp without underlying fracture. 2.  Advanced atrophy is stable since the prior exam.  This likely reflects sequelae of chronic microvascular ischemia. 3.  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  CT MAXILLOFACIAL  Findings:  An anterior central frontal scalp hematoma is evident. There is no significant laceration or foreign body.  There is no underlying fracture.  The paranasal sinuses and mastoid air cells are clear.  The nasal bones are intact.  Leftward nasal septal spurring extends 10 mm from the midline.  The ostiomeatal complex is clear.  Nasal cavity is otherwise within normal limits.  The mandible is intact and located.   IMPRESSION:  1.  Anterior frontal scalp hematoma without an underlying fracture or radiopaque foreign body. 2.  No acute osseous trauma. 3.  Leftward nasal septal spurring.  CT CERVICAL SPINE  Findings:   The cervical spine is imaged from skull base through T2- 3.  The vertebral body heights are maintained.  Slight degenerative anterolisthesis is present at C3-4 and C4-5.  C3-4:  Asymmetric right-sided uncovertebral disease is noted. Foraminal narrowing is worse on the right.  C4-5:  Asymmetric left-sided uncovertebral disease is present. Facet hypertrophy is noted.  Mild to moderate left and mild right foraminal stenosis is present.  C5-6:  A broad-based disc osteophyte complex is present.  Moderate central and bilateral foraminal stenosis is greatest at this level.  C6-7:  A leftward disc osteophyte complex is present. Uncovertebral spurring is worse on the left.  Moderate left mild right foraminal stenosis is present.  C7-T1:  A broad-based disc osteophyte complex is present. Uncovertebral disease is worse on the left.  Moderate left and mild right foraminal stenosis is evident.  IMPRESSION:  1.  Multilevel cervical spondylosis is most pronounced at C5-6. 2.  No acute fracture or traumatic subluxation.   Original Report Authenticated By: Marin Roberts, M.D.    Ct Cervical Spine Wo Contrast  01/04/2013  *RADIOLOGY REPORT*  Clinical Data:  Fall.  Altered mental status.  Swelling and abrasion to the mid forehead.  The patient is amnestic to the event.  CT HEAD WITHOUT CONTRAST CT MAXILLOFACIAL WITHOUT CONTRAST CT CERVICAL SPINE WITHOUT CONTRAST  Technique:  Multidetector CT imaging of the head, cervical spine, and maxillofacial structures were performed using the standard protocol without intravenous contrast. Multiplanar CT image reconstructions of the cervical spine and maxillofacial structures were also generated.  Comparison:  MRI brain 08/09/2011  CT HEAD  Findings: Extensive periventricular and  subcortical white matter disease is similar to the prior exam a lacunar infarct of the right internal capsule is new from prior study, but does not appear acute.  Remote lacunar infarcts are noted in the cerebellum.  The ventricles are proportionate to the degree of atrophy.  There is no significant extra-axial fluid collection.  Soft tissue swelling is noted in the frontal scalp at the level of the frontal sinuses.  There is no underlying fracture.  The paranasal sinuses and mastoid air cells are clear.  The osseous skull is intact.  Atherosclerotic calcifications are present in the cavernous carotid arteries.  IMPRESSION:  1.  Mild soft tissue swelling in the frontal scalp without underlying fracture. 2.  Advanced atrophy is  stable since the prior exam.  This likely reflects sequelae of chronic microvascular ischemia. 3.  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  CT MAXILLOFACIAL  Findings:  An anterior central frontal scalp hematoma is evident. There is no significant laceration or foreign body.  There is no underlying fracture.  The paranasal sinuses and mastoid air cells are clear.  The nasal bones are intact.  Leftward nasal septal spurring extends 10 mm from the midline.  The ostiomeatal complex is clear.  Nasal cavity is otherwise within normal limits.  The mandible is intact and located.  IMPRESSION:  1.  Anterior frontal scalp hematoma without an underlying fracture or radiopaque foreign body. 2.  No acute osseous trauma. 3.  Leftward nasal septal spurring.  CT CERVICAL SPINE  Findings:   The cervical spine is imaged from skull base through T2- 3.  The vertebral body heights are maintained.  Slight degenerative anterolisthesis is present at C3-4 and C4-5.  C3-4:  Asymmetric right-sided uncovertebral disease is noted. Foraminal narrowing is worse on the right.  C4-5:  Asymmetric left-sided uncovertebral disease is present. Facet hypertrophy is noted.   Mild to moderate left and mild right foraminal stenosis is present.  C5-6:  A broad-based disc osteophyte complex is present.  Moderate central and bilateral foraminal stenosis is greatest at this level.  C6-7:  A leftward disc osteophyte complex is present. Uncovertebral spurring is worse on the left.  Moderate left mild right foraminal stenosis is present.  C7-T1:  A broad-based disc osteophyte complex is present. Uncovertebral disease is worse on the left.  Moderate left and mild right foraminal stenosis is evident.  IMPRESSION:  1.  Multilevel cervical spondylosis is most pronounced at C5-6. 2.  No acute fracture or traumatic subluxation.   Original Report Authenticated By: Marin Roberts, M.D.    Dg Hand Complete Right  01/04/2013  *RADIOLOGY REPORT*  Clinical Data: Fall.  Right hand pain.  RIGHT HAND - COMPLETE 3+ VIEW  Comparison: Plain films 07/14/2005.  Findings: Imaged bones, joints and soft tissues appear normal.  IMPRESSION: Negative exam.   Original Report Authenticated By: Holley Dexter, M.D.    Ct Maxillofacial Wo Cm  01/04/2013  *RADIOLOGY REPORT*  Clinical Data:  Fall.  Altered mental status.  Swelling and abrasion to the mid forehead.  The patient is amnestic to the event.  CT HEAD WITHOUT CONTRAST CT MAXILLOFACIAL WITHOUT CONTRAST CT CERVICAL SPINE WITHOUT CONTRAST  Technique:  Multidetector CT imaging of the head, cervical spine, and maxillofacial structures were performed using the standard protocol without intravenous contrast. Multiplanar CT image reconstructions of the cervical spine and maxillofacial structures were also generated.  Comparison:  MRI brain 08/09/2011  CT HEAD  Findings: Extensive periventricular and subcortical white matter disease is similar to the prior exam a lacunar infarct of the right internal capsule is new from prior study, but does not appear acute.  Remote lacunar infarcts are noted in the cerebellum.  The ventricles are proportionate to the degree of  atrophy.  There is no significant extra-axial fluid collection.  Soft tissue swelling is noted in the frontal scalp at the level of the frontal sinuses.  There is no underlying fracture.  The paranasal sinuses and mastoid air cells are clear.  The osseous skull is intact.  Atherosclerotic calcifications are present in the cavernous carotid arteries.  IMPRESSION:  1.  Mild soft tissue swelling in the frontal scalp without underlying fracture. 2.  Advanced atrophy is stable since the prior exam.  This likely reflects sequelae of chronic microvascular ischemia. 3.  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  CT MAXILLOFACIAL  Findings:  An anterior central frontal scalp hematoma is evident. There is no significant laceration or foreign body.  There is no underlying fracture.  The paranasal sinuses and mastoid air cells are clear.  The nasal bones are intact.  Leftward nasal septal spurring extends 10 mm from the midline.  The ostiomeatal complex is clear.  Nasal cavity is otherwise within normal limits.  The mandible is intact and located.  IMPRESSION:  1.  Anterior frontal scalp hematoma without an underlying fracture or radiopaque foreign body. 2.  No acute osseous trauma. 3.  Leftward nasal septal spurring.  CT CERVICAL SPINE  Findings:   The cervical spine is imaged from skull base through T2- 3.  The vertebral body heights are maintained.  Slight degenerative anterolisthesis is present at C3-4 and C4-5.  C3-4:  Asymmetric right-sided uncovertebral disease is noted. Foraminal narrowing is worse on the right.  C4-5:  Asymmetric left-sided uncovertebral disease is present. Facet hypertrophy is noted.  Mild to moderate left and mild right foraminal stenosis is present.  C5-6:  A broad-based disc osteophyte complex is present.  Moderate central and bilateral foraminal stenosis is greatest at this level.  C6-7:  A leftward disc osteophyte complex is present.  Uncovertebral spurring is worse on the left.  Moderate left mild right foraminal stenosis is present.  C7-T1:  A broad-based disc osteophyte complex is present. Uncovertebral disease is worse on the left.  Moderate left and mild right foraminal stenosis is evident.  IMPRESSION:  1.  Multilevel cervical spondylosis is most pronounced at C5-6. 2.  No acute fracture or traumatic subluxation.   Original Report Authenticated By: Marin Roberts, M.D.      ECG shows normal sinus rhythm with a rate of 88, no ectopy. Normal axis. Normal P wave. Normal QRS. Normal intervals. Normal ST and T waves. Impression: normal ECG. Compared with ECG of 04/11/1999 and, no significant changes are seen.   1. Fall down embankment, initial encounter   2. Abrasion of face, initial encounter       MDM  Acute altered mental status of uncertain cause. Based on EMS report, she seems to be improving as far as her altered mentation. The pattern of altered mentation with urinary incontinence and amnesia suggestive of a seizure. I reviewed her past records and she does have a history of breast cancer. MRI in December 2012 showed no brain metastases at that time. However, I am concerned about seizures secondary to new brain metastases. She will be sent for CT of the head and laboratory workup has been initiated.  Her husband has arrived and states that her mental status is at her baseline. She has been confused related to a medication as she is taking for breast cancer. He states that there is a steep [place]. near where they live and she tends to shuffle when going down a hill and he frequently has to help support her to keep her from falling. She was walking by herself this time. When I heard of shuffling gait and off balance, I was concerned about possible Parkinson's disease. I reevaluated her and found that she does have some cogwheel rigidity and have discussed with the her husband in possibly being evaluated by a neurologist to  see if there is  medication that might help her gait and balance issues.  Dione Booze, MD 01/04/13 [phone]

## 2013-01-04 NOTE — ED Notes (Addendum)
rn spoke to pt's husband ted via contact # in previous chart of pt.  He is on the way. Denies any history of seizures or ams. Contact number [phone]

## 2013-03-20 ENCOUNTER — Ambulatory Visit (INDEPENDENT_AMBULATORY_CARE_PROVIDER_SITE_OTHER): Payer: Medicare Other | Admitting: Neurology

## 2013-03-20 ENCOUNTER — Other Ambulatory Visit: Payer: Self-pay | Admitting: Neurology

## 2013-03-20 ENCOUNTER — Encounter: Payer: Self-pay | Admitting: Neurology

## 2013-03-20 VITALS — BP 168/80 | HR 97 | Ht 64.0 in | Wt 167.5 lb

## 2013-03-20 DIAGNOSIS — F09 Unspecified mental disorder due to known physiological condition: Secondary | ICD-10-CM

## 2013-03-20 DIAGNOSIS — G20 Parkinson's disease: Secondary | ICD-10-CM

## 2013-03-20 DIAGNOSIS — R4189 Other symptoms and signs involving cognitive functions and awareness: Secondary | ICD-10-CM

## 2013-03-20 DIAGNOSIS — R4182 Altered mental status, unspecified: Secondary | ICD-10-CM

## 2013-03-20 DIAGNOSIS — G20C Parkinsonism, unspecified: Secondary | ICD-10-CM
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
## 2013-03-20 NOTE — Patient Instructions (Signed)
Overall you are doing fairly well but I do want to suggest a few things today:   Remember to drink plenty of fluid, eat healthy meals and do not skip any meals. Try to eat protein with a every meal and eat a healthy snack such as fruit or nuts in between meals. Try to keep a regular sleep-wake schedule and try to exercise daily, particularly in the form of walking, 20-30 minutes a day, if you can.   As far as your medications are concerned, I would like to suggest holding off on starting medicatons at this time pending the workup discussed  As far as diagnostic testing:  Brain MRI with and without contrast. We will contact you to schedule this Lab tests for B12 and TSH Referral for Physical Therapy to help with your walking.  I would like to see you back once the above workup is completed, sooner if we need to. Please call us with any interim questions, concerns, problems, updates or refill requests.   Please also call us for any test results so we can go over those with you on the phone.  My clinical assistant and will answer any of your questions and relay your messages to me and also relay most of my messages to you.   Our phone number is [phone]. We also have an after hours call service for urgent matters and there is a physician on-call for urgent questions. For any emergencies you know to call 911 or go to the nearest emergency room

## 2013-03-21 ENCOUNTER — Telehealth: Payer: Self-pay | Admitting: Neurology

## 2013-03-21 LAB — TSH: TSH: 5.77 u[IU]/mL — ABNORMAL HIGH (ref 0.450–4.500)

## 2013-03-21 LAB — VITAMIN B12: Vitamin B-12: 109 pg/mL — ABNORMAL LOW (ref 211–946)

## 2013-03-21 NOTE — Telephone Encounter (Signed)
Patient called to notify her of abnormally low vitamin B12 and abnormal TSH. Will order B12 injections, once daily x5 days, then once weekly x4 weeks then once monthly. Notified RN Alverda Skeans of this need, patient to be called. Notified patient of abnormal TSh level and instructed her to follow up with PCP. Patient expresses understanding.

## 2013-03-23 ENCOUNTER — Telehealth: Payer: Self-pay | Admitting: *Deleted

## 2013-03-23 ENCOUNTER — Encounter: Payer: Self-pay | Admitting: Neurology

## 2013-03-23 NOTE — Telephone Encounter (Signed)
Thanks for calling the patient. As you already stated, it is fine to take IM B12 and her medication. It is fine if she gets the injections at her PCP's.

## 2013-03-23 NOTE — Telephone Encounter (Addendum)
I called and spoke to husband and he relayed that pt has taken B12 vit orally and had a problem (gi issues), and is questioning if she would have a problem with this IM.  I told him that taking orally is different from taking thru injection.  I would relay to Dr. Hosie Poisson and he wanted to make sure ok to take with her daily cancer drug .  I told him that Dr. Hosie Poisson is aware of her medications and B12 is something that she gets thru her diet normally.  He wanted to have these done thru pcp, which is closer vicinity.  This is Dr. Kari Baars in [place]. LMVM for nurse at Dr. Juanetta Gosling office to call me back.  [phone]

## 2013-03-23 NOTE — Telephone Encounter (Signed)
Joy from Dr. Juanetta Gosling called and they would be glad to do this for pt.  I will fax notes to her.  See med list for B 12 instructions.  Informed pt to call and set up visit for B 12. He verbalized understanding.

## 2013-03-23 NOTE — Telephone Encounter (Signed)
Message copied by Hermenia Fiscal on Fri Mar 23, 2013 10:36 AM ------      Message from: Ramond Marrow      Created: Wed Mar 21, 2013  4:13 PM       B12 injections, please see note. Patient is aware of need for injections. Just needs to be scheduled. ------

## 2013-04-05 ENCOUNTER — Ambulatory Visit
Admission: RE | Admit: 2013-04-05 | Discharge: 2013-04-05 | Disposition: A | Discharge status: Home / Self Care | Payer: Medicare Other | Source: Ambulatory Visit | Attending: Neurology | Admitting: Neurology

## 2013-04-05 DIAGNOSIS — G20 Parkinson's disease: Secondary | ICD-10-CM

## 2013-04-05 DIAGNOSIS — R4182 Altered mental status, unspecified: Secondary | ICD-10-CM

## 2013-04-05 MED ORDER — GADOBENATE DIMEGLUMINE 529 MG/ML IV SOLN
15.0000 mL | Freq: Once | INTRAVENOUS | Status: AC | PRN
  Administered 2013-04-05: 15 mL via INTRAVENOUS

## 2013-04-06 ENCOUNTER — Telehealth: Payer: Self-pay

## 2013-04-06 NOTE — Telephone Encounter (Signed)
Message copied by [facility] on Fri Apr 06, 2013  4:07 PM ------      Message from: Ramond Marrow      Created: Fri Apr 06, 2013  3:35 PM       Hi. Please call patient and let her know the MRI is unremarkable. Thanks. ------

## 2013-04-06 NOTE — Telephone Encounter (Signed)
I called patient and let her know that MRI is unremarkable.

## 2013-04-16 ENCOUNTER — Telehealth: Payer: Self-pay | Admitting: Neurology

## 2013-05-04 ENCOUNTER — Other Ambulatory Visit (INDEPENDENT_AMBULATORY_CARE_PROVIDER_SITE_OTHER): Payer: Self-pay | Admitting: Surgery

## 2013-05-04 ENCOUNTER — Other Ambulatory Visit: Payer: Self-pay

## 2013-05-04 DIAGNOSIS — Z1231 Encounter for screening mammogram for malignant neoplasm of breast: Secondary | ICD-10-CM

## 2013-05-04 DIAGNOSIS — Z9011 Acquired absence of right breast and nipple: Secondary | ICD-10-CM

## 2013-06-05 ENCOUNTER — Telehealth: Payer: Self-pay | Admitting: Oncology

## 2013-06-05 ENCOUNTER — Ambulatory Visit (HOSPITAL_BASED_OUTPATIENT_CLINIC_OR_DEPARTMENT_OTHER): Payer: Medicare Other | Admitting: Oncology

## 2013-06-05 ENCOUNTER — Other Ambulatory Visit (HOSPITAL_BASED_OUTPATIENT_CLINIC_OR_DEPARTMENT_OTHER): Payer: Medicare Other | Admitting: Lab

## 2013-06-05 VITALS — BP 178/108 | HR 80 | Temp 97.8°F | Resp 19 | Ht 64.0 in | Wt 166.4 lb

## 2013-06-05 DIAGNOSIS — C50419 Malignant neoplasm of upper-outer quadrant of unspecified female breast: Secondary | ICD-10-CM

## 2013-06-05 DIAGNOSIS — C50919 Malignant neoplasm of unspecified site of unspecified female breast: Secondary | ICD-10-CM

## 2013-06-05 DIAGNOSIS — C50411 Malignant neoplasm of upper-outer quadrant of right female breast: Secondary | ICD-10-CM | POA: Insufficient documentation

## 2013-06-05 DIAGNOSIS — Z170 Estrogen receptor positive status [ER+]: Secondary | ICD-10-CM

## 2013-06-05 DIAGNOSIS — M549 Dorsalgia, unspecified: Secondary | ICD-10-CM

## 2013-06-05 LAB — CBC WITH DIFFERENTIAL/PLATELET
BASO%: 1 % (ref 0.0–2.0)
Eosinophils Absolute: 0.2 10*3/uL (ref 0.0–0.5)
MCHC: 33.4 g/dL (ref 31.5–36.0)
MONO#: 0.7 10*3/uL (ref 0.1–0.9)
NEUT#: 4.1 10*3/uL (ref 1.5–6.5)
RBC: 4.19 10*6/uL (ref 3.70–5.45)
RDW: 14.5 % (ref 11.2–14.5)
WBC: 6.7 10*3/uL (ref 3.9–10.3)
lymph#: 1.7 10*3/uL (ref 0.9–3.3)

## 2013-06-05 LAB — COMPREHENSIVE METABOLIC PANEL (CC13)
ALT: 25 U/L (ref 0–55)
Albumin: 3.8 g/dL (ref 3.5–5.0)
Anion Gap: 12 mEq/L — ABNORMAL HIGH (ref 3–11)
CO2: 24 mEq/L (ref 22–29)
Chloride: 106 mEq/L (ref 98–109)
Potassium: 3.6 mEq/L (ref 3.5–5.1)
Sodium: 142 mEq/L (ref 136–145)
Total Bilirubin: 0.35 mg/dL (ref 0.20–1.20)
Total Protein: 7.6 g/dL (ref 6.4–8.3)

## 2013-06-05 NOTE — Progress Notes (Signed)
Dr Darnelle Catalan,  Thanks for the update on Shelly Willis. From my standpoint I would not want to switch her off the letrozole if she is having a good response to that. As you noted, there are enough possible neurologic causes of her cognitive decline that need to be completely worked up/treated before we should consider making a switch to the letrozole. I see her back at the end of this month and will discuss next steps with her at that point.   Thanks.  Shelly Willis

## 2013-06-05 NOTE — Telephone Encounter (Signed)
, °

## 2013-06-05 NOTE — Progress Notes (Signed)
ID: Shelly Willis OB: Jun 14, 1941  MR#: [id number]  ZDG#:[id number]  PCP: Fredirick Maudlin, MD GYN:   SU:  OTHER MD: Omelia Blackwater  CHIEF COMPLAINT:  HISTORY OF PRESENT ILLNESS: The patient had routine screening mammography 04/21/2011. This showed a possible distortion in the right breast. The patient was brought back for additional views October 3 which confirmed the area of distortion. Ultrasound showed a 1.8 cm suspicious area and this was biopsied October 17, showing [facility] [id number]) an invasive mammary carcinoma with lobular features. This was strongly estrogen and progesterone receptor positive, HER-2 negative, with an elevated MIB-1. The patient was then referred to Dr. Wenda Low and bilateral breast MRIs were obtained October 22 confirming a 2.4 cm mass in the upper right breast at the 12:00 position. There was some right nipple retraction and skin thickening but this was felt to be secondary to post biopsy change. There were no other suspicious areas of concern. Specifically there was no evidence of axillary or internal mammary adenopathy. She was treated neoadjuvantly with letrozole, with her further breast cancer history as detailed below.    INTERVAL HISTORY: Shelly Willis returns today with her husband [name] for followup of her breast cancer. Since her last visit here she had her cataract surgery under Dr. Jettie Pagan and that went well. She also had a neurologic evaluation under Dr. Hosie Poisson who documented a B12 deficiency. He did find possible parkinsonism and some cognitive decline and obtain a brain MRI which was from a cancer point of view unremarkable  REVIEW OF SYSTEMS: Shelly Willis is "better" as far as her husband is concerned she does describe herself as mildly fatigued. Her vision is improved. She keeps a runny nose especially this time of year. She has some stress urinary incontinent symptoms. She has low back pain which has been previously evaluated and this was present in April, when  we obtain a bone scan which was essentially unremarkable in that area. She does describe herself as forgetful. She has relatively frequent headaches, which are bitemporal. She has moderate hot flashes. Aside from hot flashes she is tolerating the letrozole with no side effects that she is aware of. Otherwise a detailed review of systems today was noncontributory  PAST MEDICAL HISTORY: Past Medical History  Diagnosis Date  . Cataract   . Hyperlipidemia   . History of neck surgery cervical laminectomy  . Urethral polyp removed AUG 2010  . Stress incontinence, female   . Migraines   . PONV (postoperative nausea and vomiting)   . H/O exercise stress test     done in PCP- office, maybe 15 yrs. ago   . Cancer     right breast  . Aromatase inhibitor use Nov. 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  Planned for "minimum of 5 years"  . Breast cancer 06/16/11    Right Breast     PAST SURGICAL HISTORY: Past Surgical History  Procedure Laterality Date  . Cholecystectomy  1993  . Tubal ligation  1977  . Bladder surgery  2011    growth attached to bladder stem   . Bilateral salpingoophorectomy    . Ruptured disc surgery 1988  1988  . Kidney stones    . Modified mastectomy  12/28/2011    Procedure: MODIFIED MASTECTOMY;  Surgeon: Valarie Merino, MD;  Location: [facility];  Service: General;  Laterality: Right;  Right modified mastectomy/   . Breast  surgery  12/01/2011    Right breast lumpectomy  . Needle core biopsy  11/02/11    Left Breast Needle Core Biopsy, 9'Oclocl, No Malignancy: Biospy Axilla - Mammary Carcinoma, ER/PR Positive, Low Ki67  . Needle core biopsy  06/16/11    Right Breast - Invasive Mammary with Lobular features  . Abdominal hysterectomy  1997    complete    FAMILY HISTORY Family History  Problem Relation Age of Onset  . Cancer Neg Hx   . Stroke Mother     in mid  58's  The patient's father died in an automobile accident at the age of 45. The patient's mother died in her mid-80s status post stroke the patient has one brother and one sister in fair health there is no history of breast or ovarian cancer in the family    GYNECOLOGIC HISTORY:  Menarche age 46 hysterectomy age 59 the patient took hormone replacement approximately 20 years. 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 Her husband [name] is a retired Acupuncturist. Son Raiford Noble works as a Technical sales engineer daughter-in-law Archie Patten is a homemaker and homeschools her 3 children. The patient attends the [employer]      ADVANCED DIRECTIVES: in place   HEALTH MAINTENANCE: History  Substance Use Topics  . Smoking status: Former Smoker -- 1.00 packs/day    Types: Cigarettes    Quit date: 07/07/1987  . Smokeless tobacco: Never Used  . Alcohol Use: 0.6 oz/week    1 Glasses of wine per week     Comment: socially     Colonoscopy: 2003  PAP:  Bone density:  Lipid panel:  Allergies  Allergen Reactions  . Vesicare [Solifenacin Succinate] Other (See Comments)    Headaches and upset stomach     Current Outpatient Prescriptions  Medication Sig Dispense Refill  . BESIVANCE 0.6 % SUSP       . calcium citrate-vitamin D 200-200 MG-UNIT TABS Take 1 tablet by mouth every morning.       . cyanocobalamin (,VITAMIN B-12,) 1000 MCG/ML injection Inject 1,000 mcg into the muscle once. Once daily x5 days then once weekly x4 weeks then once monthly      . DUREZOL 0.05 % EMUL       . letrozole (FEMARA) 2.5 MG tablet Take 2.5 mg by mouth every morning.      Marland Kitchen losartan (COZAAR) 100 MG tablet Take 100 mg by mouth every morning.       . Naproxen Sodium (ALEVE) 220 MG CAPS Take 220-440 capsules by mouth daily as needed (for pain. Only used occasionally if needed).      .  Vitamin C-Vitamin E-Panthenol (VITAMIN E & C BEAUTY LOTION EX) Apply 1 application topically daily.       . [DISCONTINUED] estrogens, conjugated, (PREMARIN) 1.25 MG tablet Take 1.25 mg by mouth daily.        No current facility-administered medications for this visit.    OBJECTIVE: Middle-aged white woman in no acute distress Filed Vitals:   06/05/13 0941  BP: 178/108  Pulse: 80  Temp: 97.8 F (36.6 C)  Resp: 19     Body mass index is 28.55 kg/(m^2).    ECOG FS:1 - Symptomatic but completely ambulatory  Ocular: Sclerae unicteric Ear-nose-throat: Oropharynx clear, dentition fair Lymphatic: No cervical or supraclavicular adenopathy Lungs no rales or rhonchi, good excursion  bilaterally Heart regular rate and rhythm, no murmur appreciated Abd soft, nontender, positive bowel sounds MSK no focal spinal tenderness, no upper extremity lymphedema Neuro: non-focal; unable to remember what she had for breakfast today or for lunch yesterday. Pleasant affect Breasts: The right breast is status post mastectomy. There is no evidence of local recurrence. The right axilla is benign. Left breast is unremarkable.   LAB RESULTS:  CMP     Component Value Date/Time   NA 142 06/05/2013 0903   NA 140 01/04/2013 1518   K 3.6 06/05/2013 0903   K 4.3 01/04/2013 1518   CL 101 01/04/2013 1518   CL 105 12/04/2012 0931   CO2 24 06/05/2013 0903   CO2 26 01/04/2013 1518   GLUCOSE 107 06/05/2013 0903   GLUCOSE 132* 01/04/2013 1518   GLUCOSE 93 12/04/2012 0931   BUN 12.5 06/05/2013 0903   BUN 16 01/04/2013 1518   CREATININE 1.0 06/05/2013 0903   CREATININE 1.01 01/04/2013 1518   CALCIUM 10.3 06/05/2013 0903   CALCIUM 10.7* 01/04/2013 1518   PROT 7.6 06/05/2013 0903   PROT 8.1 01/04/2013 1518   ALBUMIN 3.8 06/05/2013 0903   ALBUMIN 4.3 01/04/2013 1518   AST 26 06/05/2013 0903   AST 23 01/04/2013 1518   ALT 25 06/05/2013 0903   ALT 18 01/04/2013 1518   ALKPHOS 140 06/05/2013 0903   ALKPHOS 153* 01/04/2013 1518   BILITOT 0.35 06/05/2013  0903   BILITOT 0.3 01/04/2013 1518   GFRNONAA 55* 01/04/2013 1518   GFRAA 63* 01/04/2013 1518    I No results found for this basename: SPEP, UPEP,  kappa and lambda light chains    Lab Results  Component Value Date   WBC 6.7 06/05/2013   NEUTROABS 4.1 06/05/2013   HGB 13.1 06/05/2013   HCT 39.3 06/05/2013   MCV 93.6 06/05/2013   PLT 295 06/05/2013      Chemistry      Component Value Date/Time   NA 142 06/05/2013 0903   NA 140 01/04/2013 1518   K 3.6 06/05/2013 0903   K 4.3 01/04/2013 1518   CL 101 01/04/2013 1518   CL 105 12/04/2012 0931   CO2 24 06/05/2013 0903   CO2 26 01/04/2013 1518   BUN 12.5 06/05/2013 0903   BUN 16 01/04/2013 1518   CREATININE 1.0 06/05/2013 0903   CREATININE 1.01 01/04/2013 1518      Component Value Date/Time   CALCIUM 10.3 06/05/2013 0903   CALCIUM 10.7* 01/04/2013 1518   ALKPHOS 140 06/05/2013 0903   ALKPHOS 153* 01/04/2013 1518   AST 26 06/05/2013 0903   AST 23 01/04/2013 1518   ALT 25 06/05/2013 0903   ALT 18 01/04/2013 1518   BILITOT 0.35 06/05/2013 0903   BILITOT 0.3 01/04/2013 1518       Lab Results  Component Value Date   LABCA2 39 09/12/2012    No components found with this basename: LABCA125    No results found for this basename: INR,  in the last 168 hours  Urinalysis    Component Value Date/Time   COLORURINE YELLOW 01/04/2013 1325   APPEARANCEUR CLEAR 01/04/2013 1325   LABSPEC >1.030* 01/04/2013 1325   PHURINE 5.5 01/04/2013 1325   GLUCOSEU NEGATIVE 01/04/2013 1325   HGBUR SMALL* 01/04/2013 1325   BILIRUBINUR NEGATIVE 01/04/2013 1325   KETONESUR NEGATIVE 01/04/2013 1325   PROTEINUR 30* 01/04/2013 1325   UROBILINOGEN 0.2 01/04/2013 1325   NITRITE NEGATIVE 01/04/2013 1325   LEUKOCYTESUR NEGATIVE 01/04/2013 1325  STUDIES: DEXA scan April 2014 was normal. Repeat mammography is due next month. Brain MRI 04/05/2013 showed no evidence of metastatic disease  ASSESSMENT: 72 y.o. [place] woman with a stage IIIA invasive lobular breast cancer  (1) status post right breast  biopsy October 2012 for a clinical T2 NX invasive lobular carcinoma, estrogen and progesterone receptor positive at 99% and 23%, HER-2 not amplified, with an MIB-1 of 32%  (2) treated neoadjuvantly with letrozole as of the first week in November, with evidence of response  (3) status-post right lumpectomy and sentinel lymph node sampling 11/30/2011 for a T2 N2, stage IIIA invasive lobular breast cancer, again HER-2 negative, with positive margins  (4) status-post right mastectomy 12/28/2011 showing minimal residual disease in the breast, with an additional lymph node positive for tumor (total 5/5) but cleared margins  (5) bone scan November 2012 was read as suspicious for metastatic disease in the bone, with MRI showing a suspicious lesion at T8, with repeat thoracic spine MRI April 2013 and bone scan April 2014 showing no new lesions and significant decrease in uptake as compared to prior.   PLAN: Itati is doing great from a breast cancer point of view and the plan is to continue letrozole for a total of 5 years. She is going to see [place] again in 6 months. I do not believe the back pain is related to the breast cancer, as discussed above. I do not believe the cognitive dysfunction, which is in my mind fairly marked, is related to the letrozole. Certainly we could stop the letrozole for 2 or 3 months and see if it makes a difference. We could also consider switching to tamoxifen, although that brings his own possible side effects. In general cognitive dysfunction his, and after a diagnosis of breast cancer, but not to this degree, so I do agree that a neurologic cause seems likely.  [place] and [place] no to call for any problems that may develop before her next visit here. Lowella Dell, MD   06/05/2013 10:25 AM

## 2013-06-08 ENCOUNTER — Ambulatory Visit
Admission: RE | Admit: 2013-06-08 | Discharge: 2013-06-08 | Disposition: A | Discharge status: Home / Self Care | Payer: Medicare Other | Source: Ambulatory Visit

## 2013-06-08 ENCOUNTER — Encounter (INDEPENDENT_AMBULATORY_CARE_PROVIDER_SITE_OTHER): Payer: Self-pay | Admitting: Surgery

## 2013-06-08 ENCOUNTER — Ambulatory Visit (INDEPENDENT_AMBULATORY_CARE_PROVIDER_SITE_OTHER): Payer: Medicare Other | Admitting: Surgery

## 2013-06-08 VITALS — BP 162/80 | HR 74 | Temp 97.1°F | Resp 16 | Ht 65.0 in | Wt 167.4 lb

## 2013-06-08 DIAGNOSIS — Z9889 Other specified postprocedural states: Secondary | ICD-10-CM

## 2013-06-08 DIAGNOSIS — Z1231 Encounter for screening mammogram for malignant neoplasm of breast: Secondary | ICD-10-CM

## 2013-06-08 DIAGNOSIS — Z9011 Acquired absence of right breast and nipple: Secondary | ICD-10-CM

## 2013-06-08 NOTE — Patient Instructions (Signed)
Thanks for your patience.  If you need further assistance after leaving the office, please call our office and speak with a [facility] nurse.  [phone].  If you want to leave a message for Dr. Aysia Lowder, please call his office phone at [phone]. 

## 2013-06-08 NOTE — Progress Notes (Signed)
Shelly Willis 72 y.o.  Body mass index is 27.86 kg/(m^2).  Patient Active Problem List   Diagnosis Date Noted  . Breast cancer of upper-outer quadrant of right female breast 06/05/2013  . Cognitive decline 03/20/2013  . Parkinsonism 03/20/2013  . 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 Right mastectomy April 2013 12/09/2011    Allergies  Allergen Reactions  . Vesicare [Solifenacin Succinate] Other (See Comments)    Headaches and upset stomach     Past Surgical History  Procedure Laterality Date  . Cholecystectomy  1993  . Tubal ligation  1977  . Bladder surgery  2011    growth attached to bladder stem   . Bilateral salpingoophorectomy    . Ruptured disc surgery 1988  1988  . Kidney stones    . Modified mastectomy  12/28/2011    Procedure: MODIFIED MASTECTOMY;  Surgeon: Valarie Merino, MD;  Location: [facility];  Service: General;  Laterality: Right;  Right modified mastectomy/   . Breast surgery  12/01/2011    Right breast lumpectomy  . Needle core biopsy  11/02/11    Left Breast Needle Core Biopsy, 9'Oclocl, No Malignancy: Biospy Axilla - Mammary Carcinoma, ER/PR Positive, Low Ki67  . Needle core biopsy  06/16/11    Right Breast - Invasive Mammary with Lobular features  . Abdominal hysterectomy  1997    complete   HAWKINS,EDWARD L, MD No diagnosis found.  1.5 years out from her right mastectomy with postop RT.  Right flaps are smooth and no palpable masses .  Left breast is without masses. Nipple without drainage or bleeding.   No axillary adenopathy. No supraclavicular adenopathy.   Left screening mammogram was this morning and prelim showed no areas of concern.   Matt B. Daphine Deutscher, MD, [facility] [phone] beeper [phone]  06/08/2013 3:37 PM

## 2013-06-12 ENCOUNTER — Other Ambulatory Visit: Payer: Self-pay | Admitting: Pulmonary Disease

## 2013-06-12 DIAGNOSIS — R928 Other abnormal and inconclusive findings on diagnostic imaging of breast: Secondary | ICD-10-CM

## 2013-06-20 ENCOUNTER — Ambulatory Visit: Payer: Medicare Other | Admitting: Neurology

## 2013-06-21 ENCOUNTER — Ambulatory Visit (INDEPENDENT_AMBULATORY_CARE_PROVIDER_SITE_OTHER): Payer: Medicare Other | Admitting: Neurology

## 2013-06-21 ENCOUNTER — Encounter: Payer: Self-pay | Admitting: Neurology

## 2013-06-21 VITALS — BP 155/82 | HR 110 | Ht 65.0 in | Wt 165.0 lb

## 2013-06-21 DIAGNOSIS — R4189 Other symptoms and signs involving cognitive functions and awareness: Secondary | ICD-10-CM

## 2013-06-21 DIAGNOSIS — E538 Deficiency of other specified B group vitamins: Secondary | ICD-10-CM

## 2013-06-21 DIAGNOSIS — R269 Unspecified abnormalities of gait and mobility: Secondary | ICD-10-CM

## 2013-06-21 DIAGNOSIS — R2681 Unsteadiness on feet: Secondary | ICD-10-CM

## 2013-06-21 DIAGNOSIS — F09 Unspecified mental disorder due to known physiological condition: Secondary | ICD-10-CM

## 2013-06-21 NOTE — Patient Instructions (Signed)
Overall you are doing fairly well but I do want to suggest a few things today:   Remember to drink plenty of fluid, eat healthy meals and do not skip any meals. Try to eat protein with a every meal and eat a healthy snack such as fruit or nuts in between meals. Try to keep a regular sleep-wake schedule and try to exercise daily, particularly in the form of walking, 20-30 minutes a day, if you can.   As far as your medications are concerned, I would like to suggest you restart the monthly B12 inhjections. These should be continued for at least 1 month  I suggest you have a formal driving evaluation prior to attempting to restart driving. Please call Rip Harbour of [facility] at [phone]  I would like to see you back in 4 months, sooner if we need to. Please call us with any interim questions, concerns, problems, updates or refill requests.   My clinical assistant and will answer any of your questions and relay your messages to me and also relay most of my messages to you.   Our phone number is [phone]. We also have an after hours call service for urgent matters and there is a physician on-call for urgent questions. For any emergencies you know to call 911 or go to the nearest emergency room

## 2013-06-21 NOTE — Progress Notes (Signed)
[facility]   Provider:  Dr Hosie Poisson Referring Provider: Fredirick Maudlin, MD Primary Care Physician:  Fredirick Maudlin, MD  CC:  Cognitive decline  HPI:  Shelly Willis is a 72 y.o. female here as a follow up appointment with last visit on 02/2013. At that time she was started on B12 injections for a low level. Overall reports feeling much better since her last visit. Stopped B12 injections in September and feels worse since doing them. The patient and her husband will feel like her memory and ability to process information has been improving since the last visit. They also say she is moving quicker and having less gait instability and slowness of gait. Does continue to have some minor shuffling of gait and feels unsteady especially when going downhill. Denies any falls but has had some close calls.  Concerns/Questions:Review of Systems: Out of a complete 14 system review, the patient complains of only the following symptoms, and all other reviewed systems are negative. Positive for cough  History   Social History  . Marital Status: Married    Spouse Name: Shelly Willis    Number of Children: 1  . Years of Education: 12   Occupational History  . homemaker    Social History Main Topics  . Smoking status: Former Smoker -- 1.00 packs/day    Types: Cigarettes    Quit date: 07/07/1987  . Smokeless tobacco: Never Used  . Alcohol Use: 0.6 oz/week    1 Glasses of wine per week     Comment: socially  . Drug Use: No  . Sexual Activity: Not on file   Other Topics Concern  . Not on file   Social History Narrative         Married to [place] for 51 years. Patient is retired. Patient has a high school education.   Right handed.   Caffeine- tea and coffee daily.    Family History  Problem Relation Age of Onset  . Cancer Neg Hx   . Stroke Mother     in mid 46's    Past Medical History  Diagnosis Date  . Cataract   . Hyperlipidemia   . History of neck surgery  cervical laminectomy  . Urethral polyp removed AUG 2010  . Stress incontinence, female   . Migraines   . PONV (postoperative nausea and vomiting)   . H/O exercise stress test     done in PCP- office, maybe 15 yrs. ago   . Cancer     right breast  . Aromatase inhibitor use Nov. 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  Planned for "minimum of 5 years"  . Breast cancer 06/16/11    Right Breast     Past Surgical History  Procedure Laterality Date  . Cholecystectomy  1993  . Tubal ligation  1977  . Bladder surgery  2011    growth attached to bladder stem   . Bilateral salpingoophorectomy    . Ruptured disc surgery 1988  1988  . Kidney stones    . Modified mastectomy  12/28/2011    Procedure: MODIFIED MASTECTOMY;  Surgeon: Valarie Merino, MD;  Location: [facility];  Service: General;  Laterality: Right;  Right modified mastectomy/   . Breast surgery  12/01/2011    Right breast lumpectomy  . Needle core biopsy  11/02/11    Left Breast Needle Core Biopsy, 9'Oclocl, No Malignancy:  Biospy Axilla - Mammary Carcinoma, ER/PR Positive, Low Ki67  . Needle core biopsy  06/16/11    Right Breast - Invasive Mammary with Lobular features  . Abdominal hysterectomy  1997    complete    Current Outpatient Prescriptions  Medication Sig Dispense Refill  . calcium citrate-vitamin D 200-200 MG-UNIT TABS Take 1 tablet by mouth every morning.       Marland Kitchen letrozole (FEMARA) 2.5 MG tablet Take 2.5 mg by mouth every morning.      Marland Kitchen losartan (COZAAR) 100 MG tablet Take 100 mg by mouth every morning.       . Naproxen Sodium (ALEVE) 220 MG CAPS Take 220-440 capsules by mouth daily as needed (for pain. Only used occasionally if needed).      . cyanocobalamin (,VITAMIN B-12,) 1000 MCG/ML injection Inject 1,000 mcg into the muscle once. Once daily x5 days then once weekly x4 weeks then once monthly      .  [DISCONTINUED] estrogens, conjugated, (PREMARIN) 1.25 MG tablet Take 1.25 mg by mouth daily.        No current facility-administered medications for this visit.    Allergies as of 06/21/2013 - Review Complete 06/21/2013  Allergen Reaction Noted  . Vesicare [solifenacin succinate] Other (See Comments) 06/24/2011    Vitals: BP 155/82  Pulse 110  Ht 5\' 5"  (1.651 m)  Wt 165 lb (74.844 kg)  BMI 27.46 kg/m2 Last Weight:  Wt Readings from Last 1 Encounters:  06/21/13 165 lb (74.844 kg)   Last Height:   Ht Readings from Last 1 Encounters:  06/21/13 5\' 5"  (1.651 m)   Exam:  Gen: NAD, conversant  Eyes: anicteric sclerae, moist conjunctivae, decreased eye blinked  HENT: Atraumati  Neck: Trachea midline; supple,  Lungs: CTA, no wheezing, rales, rhonic  CV: RRR, no MRG  Abdomen: Soft, non-tender;  Extremities: No peripheral edema  Skin: Normal temperature, no rash,  Psych: Appropriate affect, pleasant  Neuro:  NW:GNFA 17/30 at last visit Follows simple commands, has some difficulty counting backwards by 3s  Memory: good recent and remote recall   CN:  PERRL, EOMI, mild decreased up gaze, irregular jerky eye movements, no nystagmus, no ptosis, sensation intact to LT V1-V3 bilat, face symmetric, no weakness, hearing grossly intact, palate elevates symmetrically, shoulder shrug 5/5 bilat,  tongue protrudes midline, no fasiculations noted.  Motor: normal bulk and tone  Strength:  5/5 In all extremities  Coord: rapid alternating and point-to-point (FNF, HTS) movements intact.  Reflexes: symmetrical, bilat downgoing toes  Sens: LT intact in all extremities  Gait: able to stand from chair without pushing off, slow, mild stooped shuffling gait, decreased arm swing L>R, able to heel/toe and tandem walk, negative Rhomber, retropulses with Pull test  Speech: wnl Motor UPDRS brief:  Facial Expression:  Mild masked facies Rigidity:  Paratonia  Finger taps:  R:1  L:1  Open/close  hands:  R:1  L:2  RAM:  R:1  L:1 Foot taps:  R:1  L:1  Arising from Chair:  1  Gait/FOG:  See above exam notes   Assessment:  After physical and neurologic examination, review of laboratory studies, imaging, neurophysiology testing and pre-existing records, assessment will be reviewed on the problem list.  Plan:  Treatment plan and additional workup will be reviewed under Problem List.  1)Cognitive decline 2)Gait instability 3)B12 deficiency  Ms. Gothard is a pleasant 72 year old woman presenting for follow up evaluation of gait instability and cognitive decline. Overall appears improved compared to last visit. Suspect that  B12 deficiency was playing larger role in cognitive decline though it does not completely explain all her symptoms. Suspect there is an underlying dementia too. Patient continues to exhibit some parkinsonian symptoms though they appear slightly improved compared to prior visit. Will restart B12 injections at this time, patient notes she wishes to get these with her PCP. Will monitor memory and gait at this time, consider addition of cognitive enhancers in the future. Follow up in 4 months.

## 2013-06-26 ENCOUNTER — Ambulatory Visit
Admission: RE | Admit: 2013-06-26 | Discharge: 2013-06-26 | Disposition: A | Discharge status: Home / Self Care | Payer: Medicare Other | Source: Ambulatory Visit | Attending: Pulmonary Disease | Admitting: Pulmonary Disease

## 2013-06-26 DIAGNOSIS — R928 Other abnormal and inconclusive findings on diagnostic imaging of breast: Secondary | ICD-10-CM

## 2013-07-05 ENCOUNTER — Ambulatory Visit
Admission: RE | Admit: 2013-07-05 | Discharge: 2013-07-05 | Disposition: A | Discharge status: Home / Self Care | Payer: Medicare Other | Source: Ambulatory Visit | Attending: Pulmonary Disease | Admitting: Pulmonary Disease

## 2013-08-01 IMAGING — CT CT HEAD W/O CM
4 of 9 series · 15 of 47 positions shown, 17 images · non-contrast
Comparison: MRI brain 08/09/2011

CT HEAD

CLINICAL DATA: Fall.  Altered mental status.  Swelling and
abrasion to the mid forehead.  The patient is amnestic to the
event.

CT HEAD WITHOUT CONTRAST
CT MAXILLOFACIAL WITHOUT CONTRAST
CT CERVICAL SPINE WITHOUT CONTRAST
TECHNIQUE: Multidetector CT imaging of the head, cervical spine,
and maxillofacial structures were performed using the standard
protocol without intravenous contrast. Multiplanar CT image
reconstructions of the cervical spine and maxillofacial structures
were also generated.

[Series 4: max soft 2.0 h31s · axial · 0.29mm/px · z∈[+140,+280]mm · 8 of 122 slices shown, 10 images]
[im 14/122  brain]
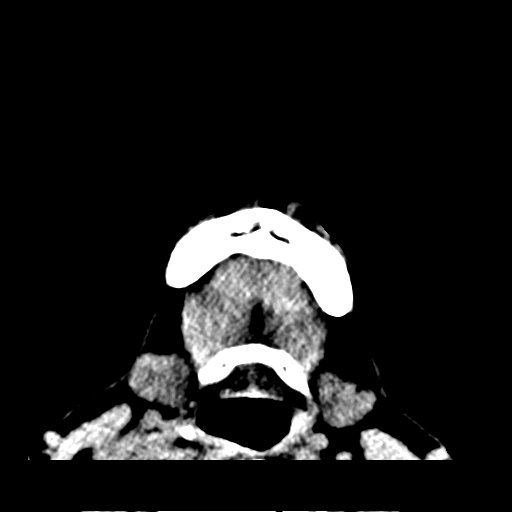
[im 14/122  bone]
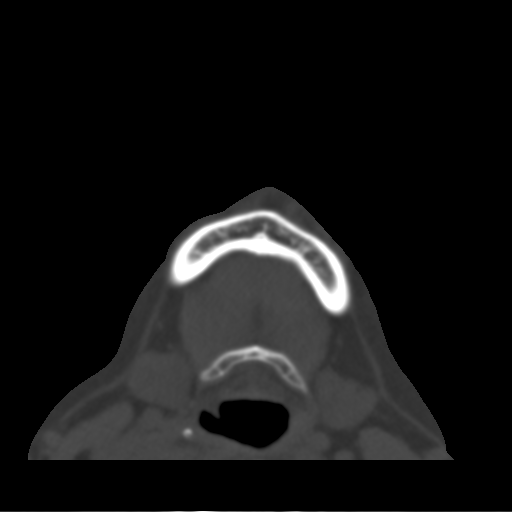
[im 27/122  brain]
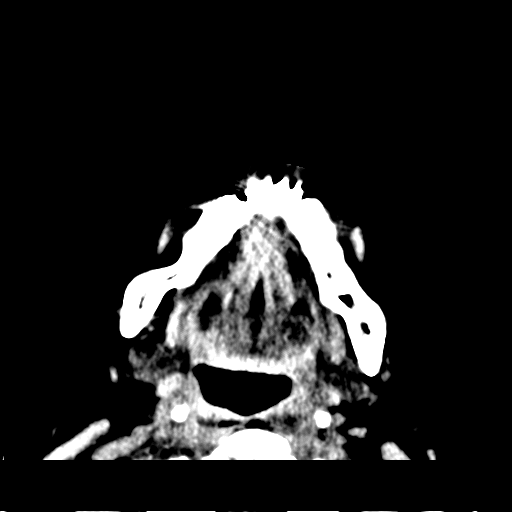
[im 41/122  brain]
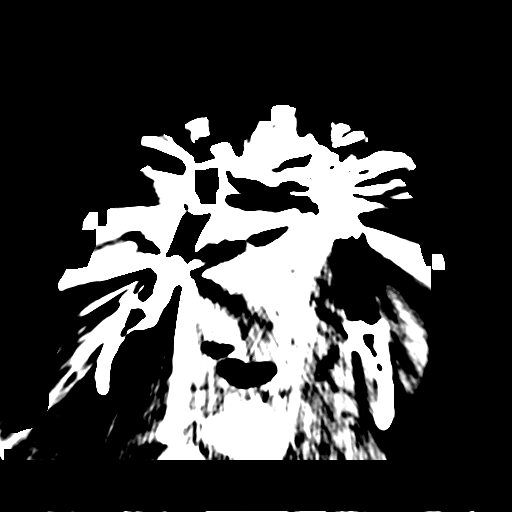
[im 54/122  brain]
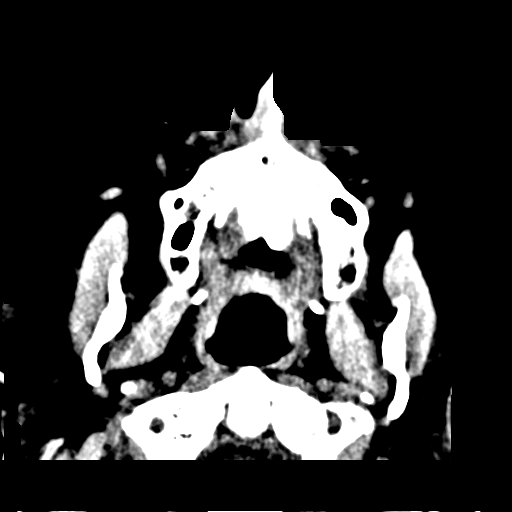
[im 68/122  brain]
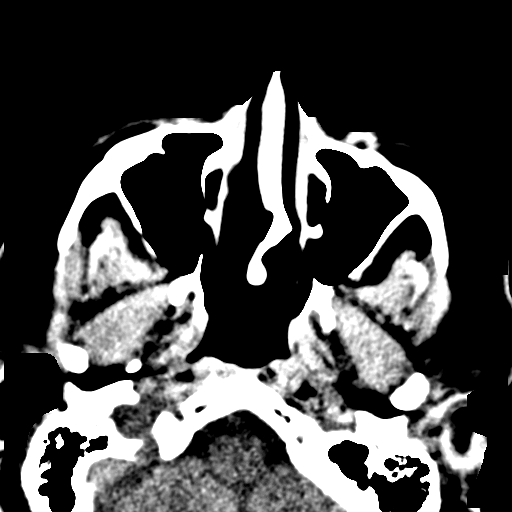
[im 68/122  bone]
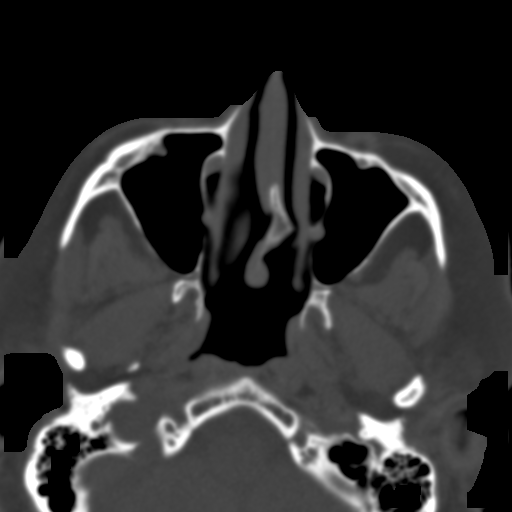
[im 81/122  brain]
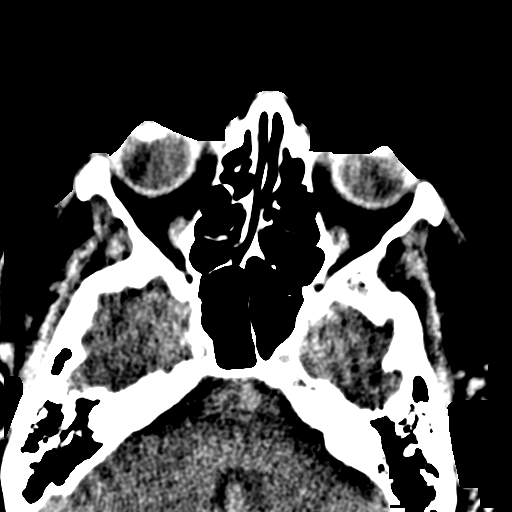
[im 95/122  brain]
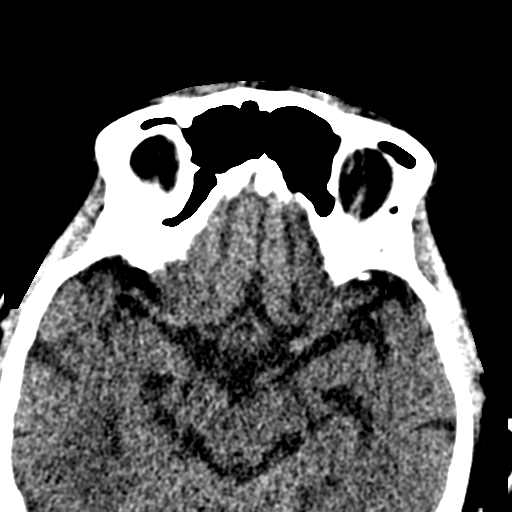
[im 108/122  brain]
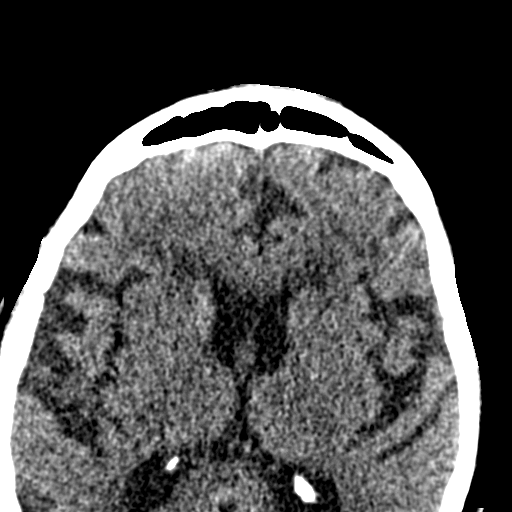

[Series 7: max st sagittal · sagittal · 0.30mm/px · 2 of 83 slices shown]
[im 28/83  brain]
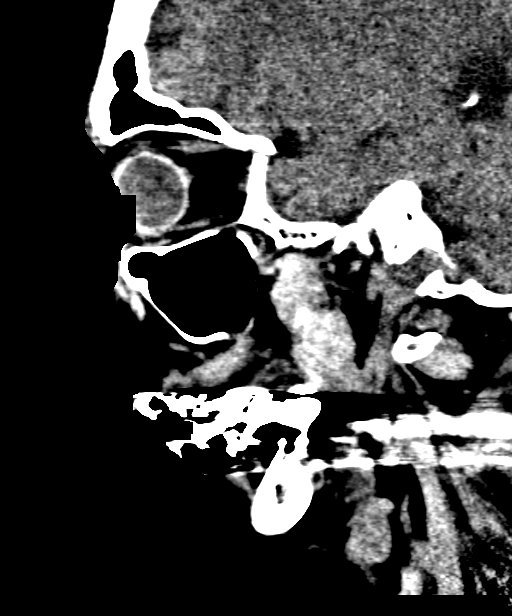
[im 55/83  brain]
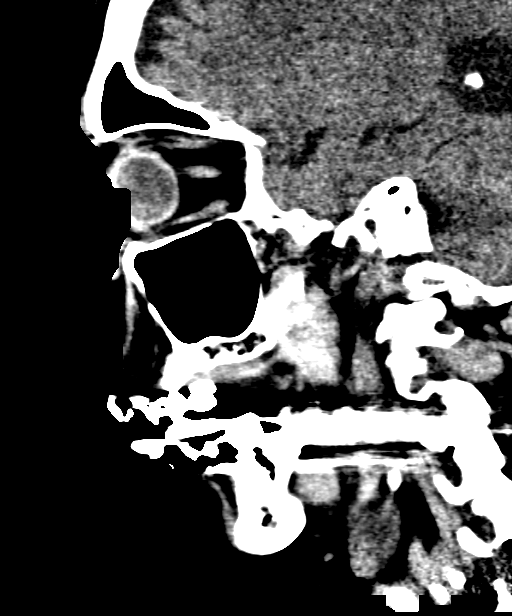

[Series 8: max bone coronal 2.0 spo · coronal · 0.32mm/px · 3 of 98 slices shown]
[im 20/98  brain]
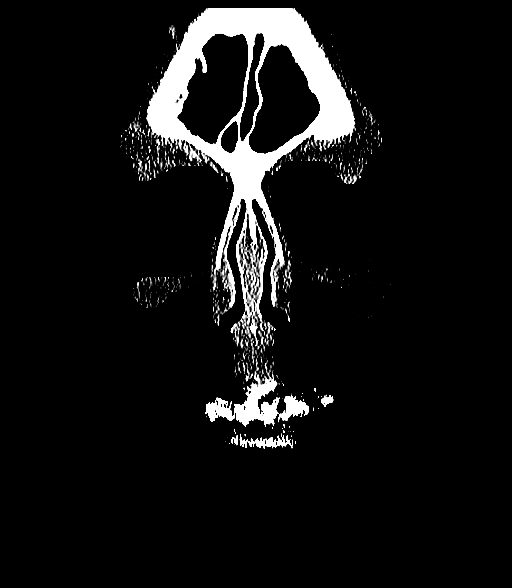
[im 39/98  brain]
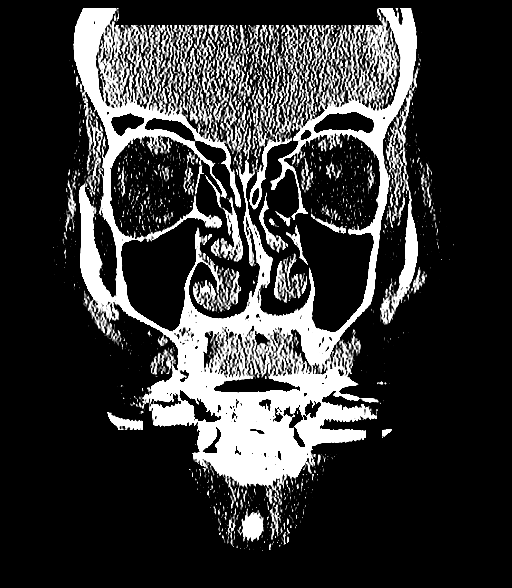
[im 59/98  brain]
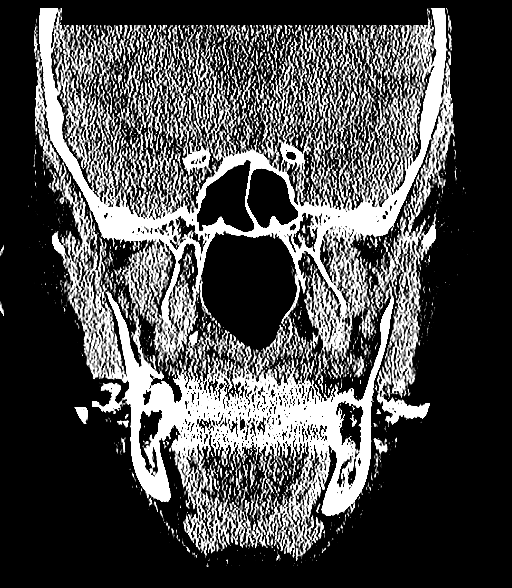

[Series 15: axial bone 2.0 · axial · 0.19mm/px · z∈[+45,+69]mm · 2 of 104 slices shown]
[im 13/104  bone]
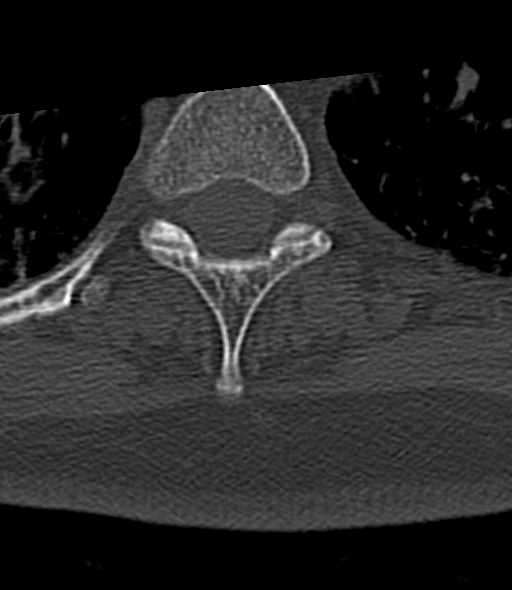
[im 26/104  bone]
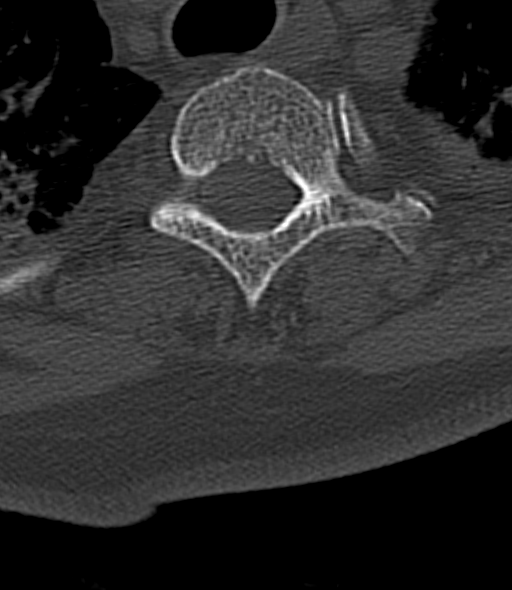

[15 of 47 positions shown; findings below may reference images not displayed]

FINDINGS: Extensive periventricular and subcortical white matter
disease is similar to the prior exam a lacunar infarct of the right
internal capsule is new from prior study, but does not appear
acute.  Remote lacunar infarcts are noted in the cerebellum.  The
ventricles are proportionate to the degree of atrophy.  There is no
significant extra-axial fluid collection.

Soft tissue swelling is noted in the frontal scalp at the level of
the frontal sinuses.  There is no underlying fracture.  The
paranasal sinuses and mastoid air cells are clear.  The osseous
skull is intact.  Atherosclerotic calcifications are present in the
cavernous carotid arteries.
IMPRESSION: 1.  Mild soft tissue swelling in the frontal scalp without
underlying fracture.
2.  Advanced atrophy is stable since the prior exam.  This likely
reflects sequelae of chronic microvascular ischemia.
3.  Lacunar infarct of the right internal capsule is new from prior
study but does not appear acute.

CT MAXILLOFACIAL
FINDINGS: An anterior central frontal scalp hematoma is evident.
There is no significant laceration or foreign body.  There is no
underlying fracture.  The paranasal sinuses and mastoid air cells
are clear.  The nasal bones are intact.  Leftward nasal septal
spurring extends 10 mm from the midline.  The ostiomeatal complex
is clear.  Nasal cavity is otherwise within normal limits.

The mandible is intact and located.
IMPRESSION: 1.  Anterior frontal scalp hematoma without an underlying fracture
or radiopaque foreign body.
2.  No acute osseous trauma.
3.  Leftward nasal septal spurring.

CT CERVICAL SPINE
FINDINGS: The cervical spine is imaged from skull base through T2-
3.  The vertebral body heights are maintained.  Slight degenerative
anterolisthesis is present at C3-4 and C4-5.

C3-4:  Asymmetric right-sided uncovertebral disease is noted.
Foraminal narrowing is worse on the right.

C4-5:  Asymmetric left-sided uncovertebral disease is present.
Facet hypertrophy is noted.  Mild to moderate left and mild right
foraminal stenosis is present.

C5-6:  A broad-based disc osteophyte complex is present.  Moderate
central and bilateral foraminal stenosis is greatest at this level.

C6-7:  A leftward disc osteophyte complex is present.
Uncovertebral spurring is worse on the left.  Moderate left mild
right foraminal stenosis is present.

C7-T1:  A broad-based disc osteophyte complex is present.
Uncovertebral disease is worse on the left.  Moderate left and mild
right foraminal stenosis is evident.
IMPRESSION: 1.  Multilevel cervical spondylosis is most pronounced at C5-6.
2.  No acute fracture or traumatic subluxation.

## 2013-10-22 ENCOUNTER — Ambulatory Visit (INDEPENDENT_AMBULATORY_CARE_PROVIDER_SITE_OTHER): Payer: Medicare Other | Admitting: Neurology

## 2013-10-22 ENCOUNTER — Encounter: Payer: Self-pay | Admitting: Neurology

## 2013-10-22 VITALS — BP 153/89 | HR 109 | Ht 65.5 in | Wt 159.0 lb

## 2013-10-22 DIAGNOSIS — R4189 Other symptoms and signs involving cognitive functions and awareness: Secondary | ICD-10-CM

## 2013-10-22 DIAGNOSIS — R2681 Unsteadiness on feet: Secondary | ICD-10-CM

## 2013-10-22 DIAGNOSIS — F09 Unspecified mental disorder due to known physiological condition: Secondary | ICD-10-CM

## 2013-10-22 DIAGNOSIS — R269 Unspecified abnormalities of gait and mobility: Secondary | ICD-10-CM

## 2013-10-22 MED ORDER — DONEPEZIL HCL 5 MG PO TABS: 5.0000 mg | ORAL_TABLET | Freq: Every day | ORAL | Status: DC

## 2013-10-22 NOTE — Progress Notes (Signed)
[facility]   Provider:  Dr [name] Referring Provider: Alonza Bogus, MD Primary Care Physician:  Alonza Bogus, MD  CC:  Cognitive decline  HPI:  Shelly Willis is a 73 y.o. female here as a follow up appointment with last visit on 05/2013. At that time she was doing well and her B12 injections were restarted. Patient feels that she is doing well since last visit. Denies any difficulty with walking, no trips or falls. Feels memory is stable. Husband notes that she has been doing well since last visit, he feels her memory is stable. He notes that after her recent colonoscopy she was very confused for about 24hrs and then returned to her baseline. She has been getting the B12 injections, they did not notice any benefit from these the past few months. They wish to hold off on further injections at this time.   Concerns/Questions:Review of Systems: Out of a complete 14 system review, the patient complains of only the following symptoms, and all other reviewed systems are negative. Denies any positive ROS  History   Social History  . Marital Status: Married    Spouse Name: Shelly Willis    Number of Children: 1  . Years of Education: 12   Occupational History  . homemaker    Social History Main Topics  . Smoking status: Former Smoker -- 1.00 packs/day    Types: Cigarettes    Quit date: 07/07/1987  . Smokeless tobacco: Never Used  . Alcohol Use: 0.6 oz/week    1 Glasses of wine per week     Comment: socially  . Drug Use: No  . Sexual Activity: Not on file   Other Topics Concern  . Not on file   Social History Narrative         Married to [place] for 51 years. Patient is retired. Patient has a high school education.   Right handed.   Caffeine- tea and coffee daily.    Family History  Problem Relation Age of Onset  . Cancer Neg Hx   . Stroke Mother     in mid 47's    Past Medical History  Diagnosis Date  . Cataract   . Hyperlipidemia   .  History of neck surgery cervical laminectomy  . Urethral polyp removed AUG 2010  . Stress incontinence, female   . Migraines   . PONV (postoperative nausea and vomiting)   . H/O exercise stress test     done in PCP- office, maybe 15 yrs. ago   . Cancer     right breast  . Aromatase inhibitor use Nov. 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  Planned for "minimum of 5 years"  . Breast cancer 06/16/11    Right Breast     Past Surgical History  Procedure Laterality Date  . Cholecystectomy  1993  . Tubal ligation  1977  . Bladder surgery  2011    growth attached to bladder stem   . Bilateral salpingoophorectomy    . Ruptured disc surgery 1988  1988  . Kidney stones    . Modified mastectomy  12/28/2011    Procedure: MODIFIED MASTECTOMY;  Surgeon: Pedro Earls, MD;  Location: [place];  Service: General;  Laterality: Right;  Right modified mastectomy/   . Breast surgery  12/01/2011    Right breast lumpectomy  . Needle core biopsy  11/02/11  Left Breast Needle Core Biopsy, 9'Oclocl, No Malignancy: Biospy Axilla - Mammary Carcinoma, ER/PR Positive, Low Ki67  . Needle core biopsy  06/16/11    Right Breast - Invasive Mammary with Lobular features  . Abdominal hysterectomy  1997    complete    Current Outpatient Prescriptions  Medication Sig Dispense Refill  . calcium citrate-vitamin D 200-200 MG-UNIT TABS Take 1 tablet by mouth every morning.       . cyanocobalamin (,VITAMIN B-12,) 1000 MCG/ML injection Inject 1,000 mcg into the muscle once. Once daily x5 days then once weekly x4 weeks then once monthly      . letrozole (FEMARA) 2.5 MG tablet Take 2.5 mg by mouth every morning.      Marland Kitchen losartan (COZAAR) 100 MG tablet Take 100 mg by mouth every morning.       . Naproxen Sodium (ALEVE) 220 MG CAPS Take 220-440 capsules by mouth daily as needed (for pain. Only used occasionally if  needed).      . [DISCONTINUED] estrogens, conjugated, (PREMARIN) 1.25 MG tablet Take 1.25 mg by mouth daily.        No current facility-administered medications for this visit.    Allergies as of 10/22/2013 - Review Complete 10/22/2013  Allergen Reaction Noted  . Vesicare [solifenacin succinate] Other (See Comments) 06/24/2011    Vitals: BP 153/89  Pulse 109  Ht 5' 5.5" (1.664 m)  Wt 159 lb (72.122 kg)  BMI 26.05 kg/m2 Last Weight:  Wt Readings from Last 1 Encounters:  10/22/13 159 lb (72.122 kg)   Last Height:   Ht Readings from Last 1 Encounters:  10/22/13 5' 5.5" (1.664 m)   Exam:  Gen: NAD, conversant  Eyes: anicteric sclerae, moist conjunctivae, decreased eye blinked  HENT: Atraumati  Neck: Trachea midline; supple,  Lungs: CTA, no wheezing, rales, rhonic  CV: RRR, no MRG  Abdomen: Soft, non-tender;  Extremities: No peripheral edema  Skin: Normal temperature, no rash,  Psych: Appropriate affect, pleasant  Neuro:  MS: MOCA 11/30 (prior MMSE 17/30) Follows simple commands, has some difficulty counting backwards by 3s  Memory: good recent and remote recall   CN:  PERRL, EOMI, mild decreased up gaze, irregular jerky eye movements, no nystagmus, no ptosis, sensation intact to LT V1-V3 bilat, face symmetric, no weakness, hearing grossly intact, palate elevates symmetrically, shoulder shrug 5/5 bilat,  tongue protrudes midline, no fasiculations noted.  Motor: normal bulk and tone  Strength:  5/5 In all extremities  Coord: rapid alternating and point-to-point (FNF, HTS) movements intact.  Reflexes: symmetrical, bilat downgoing toes  Sens: LT intact in all extremities  Gait: able to stand from chair without pushing off, slow, mild stooped shuffling gait, decreased arm swing L>R, able to heel/toe and tandem walk, negative Rhomber, retropulses with Pull test  Speech: wnl Motor UPDRS brief:  Facial Expression:  Mild masked facies Rigidity:  Paratonia  Finger  taps:  R:1  L:1  Open/close hands:  R:1  L:2  RAM:  R:1  L:1 Foot taps:  R:1  L:1  Arising from Chair:  1  Gait/FOG:  See above exam notes   Assessment:  After physical and neurologic examination, review of laboratory studies, imaging, neurophysiology testing and pre-existing records, assessment will be reviewed on the problem list.  Plan:  Treatment plan and additional workup will be reviewed under Problem List.  1)Cognitive decline 2)Gait instability 3)B12 deficiency  Ms. Dziuba is a pleasant 73 year old woman presenting for follow up evaluation of gait instability and cognitive decline.  Overall appears stable compared to last visit. Suspect that B12 deficiency was playing larger role in cognitive decline though it does not completely explain all her symptoms. Suspect there is an underlying dementia too. Patient continues to exhibit some parkinsonian symptoms though they appear stable compared to prior visit. Patient wishes to hold off on B12 injections at this time, will recheck level at next visit. Will start Aricept $RemoveBefor'5mg'HwyyhnfmOAqi$  nightly for cognitive decline, can consider increasing in the future as tolerated. Follow up in 4 months.

## 2013-10-22 NOTE — Patient Instructions (Signed)
Overall you are doing fairly well but I do want to suggest a few things today:   Remember to drink plenty of fluid, eat healthy meals and do not skip any meals. Try to eat protein with a every meal and eat a healthy snack such as fruit or nuts in between meals. Try to keep a regular sleep-wake schedule and try to exercise daily, particularly in the form of walking, 20-30 minutes a day, if you can.   As far as your medications are concerned, I would like to suggest the following: 1)Please start taking Aricept 5mg  nightly.  2)Please make sure to take all your blood pressure medications as instructed   I would like to see you back in 4 to 5 months, sooner if we need to. Please call us with any interim questions, concerns, problems, updates or refill requests.   My clinical assistant and will answer any of your questions and relay your messages to me and also relay most of my messages to you.   Our phone number is [phone]. We also have an after hours call service for urgent matters and there is a physician on-call for urgent questions. For any emergencies you know to call 911 or go to the nearest emergency room

## 2013-11-02 ENCOUNTER — Telehealth: Payer: Self-pay | Admitting: *Deleted

## 2013-11-02 NOTE — Telephone Encounter (Signed)
sw pt spouse made him aware that AGB will be in cme. gv appt for 12/04/13 w/ labs@ 9am and ov@ 9:30am. Pt spouse is aware...td

## 2013-11-09 ENCOUNTER — Encounter: Payer: Self-pay | Admitting: Oncology

## 2013-11-27 ENCOUNTER — Telehealth: Payer: Self-pay | Admitting: Oncology

## 2013-11-27 ENCOUNTER — Other Ambulatory Visit: Payer: Self-pay | Admitting: Oncology

## 2013-11-27 DIAGNOSIS — C50919 Malignant neoplasm of unspecified site of unspecified female breast: Secondary | ICD-10-CM

## 2013-11-27 NOTE — Telephone Encounter (Signed)
PER 3/31 POF APRIL APPTS MOVED TO JUNE. S/W HUSBAND RE NEW APPTS FOR 6/3 AND 6/10. SCHEDULE MAILED.

## 2013-11-29 ENCOUNTER — Other Ambulatory Visit: Payer: Self-pay | Admitting: *Deleted

## 2013-12-04 ENCOUNTER — Other Ambulatory Visit: Payer: Medicare Other

## 2013-12-04 ENCOUNTER — Ambulatory Visit: Payer: Medicare Other | Admitting: Physician Assistant

## 2013-12-04 ENCOUNTER — Ambulatory Visit: Payer: Medicare Other

## 2014-01-03 NOTE — Telephone Encounter (Signed)
Closing encounter

## 2014-01-07 ENCOUNTER — Other Ambulatory Visit: Payer: Self-pay | Admitting: *Deleted

## 2014-01-07 DIAGNOSIS — C50411 Malignant neoplasm of upper-outer quadrant of right female breast: Secondary | ICD-10-CM

## 2014-01-07 MED ORDER — LETROZOLE 2.5 MG PO TABS: 2.5000 mg | ORAL_TABLET | Freq: Every morning | ORAL | Status: DC

## 2014-01-30 ENCOUNTER — Other Ambulatory Visit: Payer: Medicare Other

## 2014-02-06 ENCOUNTER — Ambulatory Visit (HOSPITAL_BASED_OUTPATIENT_CLINIC_OR_DEPARTMENT_OTHER): Payer: Medicare Other | Admitting: Oncology

## 2014-02-06 ENCOUNTER — Telehealth: Payer: Self-pay | Admitting: Oncology

## 2014-02-06 VITALS — BP 167/85 | HR 94 | Temp 98.2°F | Resp 18 | Ht 65.5 in | Wt 158.6 lb

## 2014-02-06 DIAGNOSIS — Z170 Estrogen receptor positive status [ER+]: Secondary | ICD-10-CM

## 2014-02-06 DIAGNOSIS — C50411 Malignant neoplasm of upper-outer quadrant of right female breast: Secondary | ICD-10-CM

## 2014-02-06 DIAGNOSIS — C50919 Malignant neoplasm of unspecified site of unspecified female breast: Secondary | ICD-10-CM

## 2014-02-06 NOTE — Telephone Encounter (Signed)
per pof to sch pt appts-gave pt copy of sch °

## 2014-02-06 NOTE — Progress Notes (Signed)
ID: Shelly Willis OB: 12/04/1940  MR#: [id number]  WQV#:[id number]  PCP: Fredirick Maudlin, MD GYN:   SU:  OTHER MD: Omelia Blackwater  CHIEF COMPLAINT:  HISTORY OF PRESENT ILLNESS: From the original intake note, 12/04/2012:  The patient had routine screening mammography 04/21/2011. This showed a possible distortion in the right breast. The patient was brought back for additional views October 3 which confirmed the area of distortion. Ultrasound showed a 1.8 cm suspicious area and this was biopsied October 17, showing [facility] [id number]) an invasive mammary carcinoma with lobular features. This was strongly estrogen and progesterone receptor positive, HER-2 negative, with an elevated MIB-1. The patient was then referred to Dr. Wenda Low and bilateral breast MRIs were obtained October 22 confirming a 2.4 cm mass in the upper right breast at the 12:00 position. There was some right nipple retraction and skin thickening but this was felt to be secondary to post biopsy change. There were no other suspicious areas of concern. Specifically there was no evidence of axillary or internal mammary adenopathy. She was treated neoadjuvantly with letrozole, with her further breast cancer history as detailed below.    INTERVAL HISTORY: [name] returns today  for followup of her breast cancerwith her husband Shelly Willis. She continues on letrozole, which she tolerates well, and specifically she denies hot flashes or worsening vaginal dryness problems. She never developed to the arthralgias or myalgias that some patients can develop on aromatase inhibitors  REVIEW OF SYSTEMS: Alexismarie fell in her driveway about 4 weeks ago. She tells me she has not otherwise fallen and that she is doing much better now walking with a cane and she had a colonoscopy in January and was quite confused for a day or 2 after that but that has improved. She is no longer on Aricept or B12, but they're planning to get back to her neurologist and  perhaps start the B12 over again. In general, Shelly Willis tells me she is thinking much better than she was before and little changes in her medications can make a big changes in her thinking. She complains of occasional headaches but the main issue was lack of sleep. She sleeps during the day and then doesn't get to sleep until maybe 5 in the morning. She denies any unusual pain, fever, rash, or bleeding problems. A detailed review of systems today was otherwise stable  PAST MEDICAL HISTORY: Past Medical History  Diagnosis Date  . Cataract   . Hyperlipidemia   . History of neck surgery cervical laminectomy  . Urethral polyp removed AUG 2010  . Stress incontinence, female   . Migraines   . PONV (postoperative nausea and vomiting)   . H/O exercise stress test     done in PCP- office, maybe 15 yrs. ago   . Cancer     right breast  . Aromatase inhibitor use Nov. 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  Planned for "minimum of 5 years"  . Breast cancer 06/16/11    Right Breast     PAST SURGICAL HISTORY: Past Surgical History  Procedure Laterality Date  . Cholecystectomy  1993  . Tubal ligation  1977  . Bladder surgery  2011    growth attached to bladder stem   . Bilateral salpingoophorectomy    . Ruptured disc surgery 1988  1988  . Kidney stones    . Modified mastectomy  12/28/2011    Procedure: MODIFIED  MASTECTOMY;  Surgeon: Pedro Earls, MD;  Location: [place];  Service: General;  Laterality: Right;  Right modified mastectomy/   . Breast surgery  12/01/2011    Right breast lumpectomy  . Needle core biopsy  11/02/11    Left Breast Needle Core Biopsy, 9'Oclocl, No Malignancy: Biospy Axilla - Mammary Carcinoma, ER/PR Positive, Low Ki67  . Needle core biopsy  06/16/11    Right Breast - Invasive Mammary with Lobular features  . Abdominal hysterectomy  1997    complete    FAMILY  HISTORY Family History  Problem Relation Age of Onset  . Cancer Neg Hx   . Stroke Mother     in mid 33's  The patient's father died in an automobile accident at the age of 36. The patient's mother died in her mid-83s status post stroke the patient has one brother and one sister in fair health there is no history of breast or ovarian cancer in the family    GYNECOLOGIC HISTORY:  Menarche age 66 hysterectomy age 71 the patient took hormone replacement approximately 20 years. 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 Her husband [name] is a retired Art gallery manager. Son Liliane Channel works as a TEFL teacher daughter-in-law Kenney Houseman is a homemaker and homeschools her 3 children. The patient attends the [employer]: in place   HEALTH MAINTENANCE: History  Substance Use Topics  . Smoking status: Former Smoker -- 1.00 packs/day    Types: Cigarettes    Quit date: 07/07/1987  . Smokeless tobacco: Never Used  . Alcohol Use: 0.6 oz/week    1 Glasses of wine per week     Comment: socially     Colonoscopy: 2003  PAP:  Bone density:  Lipid panel:  Allergies  Allergen Reactions  . Vesicare [Solifenacin Succinate] Other (See Comments)    Headaches and upset stomach     Current Outpatient Prescriptions  Medication Sig Dispense Refill  . calcium citrate-vitamin D 200-200 MG-UNIT TABS Take 1 tablet by mouth every morning.       . cyanocobalamin (,VITAMIN B-12,) 1000 MCG/ML injection Inject 1,000 mcg into the muscle once. Once daily x5 days then once weekly x4 weeks then once monthly      . donepezil (ARICEPT) 5 MG tablet Take 1 tablet (5 mg total) by mouth at bedtime.  30 tablet  0  . letrozole (FEMARA) 2.5 MG tablet Take 1 tablet (2.5 mg total) by mouth every morning.  60 tablet  0  . losartan (COZAAR) 100 MG tablet Take 100 mg by  mouth every morning.       . Naproxen Sodium (ALEVE) 220 MG CAPS Take 220-440 capsules by mouth daily as needed (for pain. Only used occasionally if needed).      . [DISCONTINUED] estrogens, conjugated, (PREMARIN) 1.25 MG tablet Take 1.25 mg by mouth daily.        No current facility-administered medications for this visit.    OBJECTIVE: Middle-aged white woman walking with a cane  Filed Vitals:   02/06/14 1509  BP: 167/85  Pulse: 94  Temp: 98.2 F (36.8 C)  Resp: 18     Body mass index is 25.98 kg/(m^2).    ECOG FS:1 - Symptomatic but completely ambulatory  Ocular: Sclerae unicteric, pupils round and equal  Ear-nose-throat: Oropharynx clear,  dentition  in good repair  Lymphatic: No cervical or supraclavicular adenopathy Lungs no rales or rhonchi Heart regular rate and rhythm Abd soft, nontender, positive bowel sounds MSK no focal spinal tenderness, no upper extremity lymphedema Neuro: non-focal;  well oriented, pleasant affect  Breasts: The right breast is status post mastectomy. There is no evidence of local recurrence. The right axilla is benign.  The left  breast is unremarkable.   LAB RESULTS:  CMP     Component Value Date/Time   NA 142 06/05/2013 0903   NA 140 01/04/2013 1518   K 3.6 06/05/2013 0903   K 4.3 01/04/2013 1518   CL 101 01/04/2013 1518   CL 105 12/04/2012 0931   CO2 24 06/05/2013 0903   CO2 26 01/04/2013 1518   GLUCOSE 107 06/05/2013 0903   GLUCOSE 132* 01/04/2013 1518   GLUCOSE 93 12/04/2012 0931   BUN 12.5 06/05/2013 0903   BUN 16 01/04/2013 1518   CREATININE 1.0 06/05/2013 0903   CREATININE 1.01 01/04/2013 1518   CALCIUM 10.3 06/05/2013 0903   CALCIUM 10.7* 01/04/2013 1518   PROT 7.6 06/05/2013 0903   PROT 8.1 01/04/2013 1518   ALBUMIN 3.8 06/05/2013 0903   ALBUMIN 4.3 01/04/2013 1518   AST 26 06/05/2013 0903   AST 23 01/04/2013 1518   ALT 25 06/05/2013 0903   ALT 18 01/04/2013 1518   ALKPHOS 140 06/05/2013 0903   ALKPHOS 153* 01/04/2013 1518   BILITOT 0.35 06/05/2013 0903    BILITOT 0.3 01/04/2013 1518   GFRNONAA 55* 01/04/2013 1518   GFRAA 63* 01/04/2013 1518    I No results found for this basename: SPEP,  UPEP,   kappa and lambda light chains    Lab Results  Component Value Date   WBC 6.7 06/05/2013   NEUTROABS 4.1 06/05/2013   HGB 13.1 06/05/2013   HCT 39.3 06/05/2013   MCV 93.6 06/05/2013   PLT 295 06/05/2013      Chemistry      Component Value Date/Time   NA 142 06/05/2013 0903   NA 140 01/04/2013 1518   K 3.6 06/05/2013 0903   K 4.3 01/04/2013 1518   CL 101 01/04/2013 1518   CL 105 12/04/2012 0931   CO2 24 06/05/2013 0903   CO2 26 01/04/2013 1518   BUN 12.5 06/05/2013 0903   BUN 16 01/04/2013 1518   CREATININE 1.0 06/05/2013 0903   CREATININE 1.01 01/04/2013 1518      Component Value Date/Time   CALCIUM 10.3 06/05/2013 0903   CALCIUM 10.7* 01/04/2013 1518   ALKPHOS 140 06/05/2013 0903   ALKPHOS 153* 01/04/2013 1518   AST 26 06/05/2013 0903   AST 23 01/04/2013 1518   ALT 25 06/05/2013 0903   ALT 18 01/04/2013 1518   BILITOT 0.35 06/05/2013 0903   BILITOT 0.3 01/04/2013 1518       Lab Results  Component Value Date   LABCA2 39 09/12/2012    No components found with this basename: LABCA125    No results found for this basename: INR,  in the last 168 hours  Urinalysis    Component Value Date/Time   COLORURINE YELLOW 01/04/2013 [place] 01/04/2013 1325   LABSPEC >1.030* 01/04/2013 1325   PHURINE 5.5 01/04/2013 1325   GLUCOSEU NEGATIVE 01/04/2013 1325   HGBUR SMALL* 01/04/2013 1325   BILIRUBINUR NEGATIVE 01/04/2013 1325   KETONESUR NEGATIVE 01/04/2013 1325   PROTEINUR 30* 01/04/2013 1325   UROBILINOGEN 0.2 01/04/2013 1325   NITRITE NEGATIVE  01/04/2013 [place] 01/04/2013 1325    STUDIES: DEXA scan April 2014 was normal. Repeat mammography is due next month. Brain MRI 04/05/2013 showed no evidence of metastatic disease  ASSESSMENT: 73 y.o. [place] woman with a stage IIIA invasive lobular breast cancer   (1) status post right breast biopsy  October 2012 for a clinical T2 NX invasive lobular carcinoma, estrogen and progesterone receptor positive at 99% and 23%, HER-2 not amplified, with an MIB-1 of 32%   (2) treated neoadjuvantly with letrozole as of the first week in November, continuing to total 5 years  (3) status-post right lumpectomy and sentinel lymph node sampling 11/30/2011 for a T2 N2, stage IIIA invasive lobular breast cancer, again HER-2 negative, with positive margins   (4) status-post right mastectomy 12/28/2011 showing minimal residual disease in the breast, with an additional lymph node positive for tumor (total 5/5) but cleared margins   (5) bone scan November 2012 was read as suspicious for metastatic disease in the bone, with MRI showing a suspicious lesion at T8, with repeat thoracic spine MRI April 2013 and bone scan April 2014 showing no new lesions and significant decrease in uptake as compared to prior.   PLAN: Harveen is doing fine from a breast cancer point of view, now a little over 2 years out from her definitive surgery. There is no evidence of disease recurrence. She is tolerating the letrozole well, and the plan will be to continue letrozole until she completes 5 years, which will be in the spring of 2018.  Today we talked about sleep patterns and I strongly suggested that she not take naps during the day, that she keep her nighttime room cold, and that she wake up at the same time every morning. I think if she does these simple thinks she will retraining herself to go to sleep in a more reasonable time. If she needs something to fall asleep she should avoid Xanax, Ativan, Valium, Ambien, Lunesta, or similar drugs. She may want to take a half a Benadryl or at most one Benadryl. I think any other medicines in that category is likely to cause her a great deal of confusion, as indeed would even mild narcotics, in my opinion.  I have encouraged her to consider getting a walk/care if she falls again. She is also  planning to resume B12 shots, which I think is a good idea.  Otherwise she will see me again in one year. She will see Dr. Hassell Done in November, after her October mammogram. Neylan  has a good understanding of the overall plan. She agrees with it. She knows the goal of treatment in her case is cure. She will call with any problems that may develop before her next visit here.  Marland Kitchen Chauncey Cruel, MD   02/06/2014 3:39 PM

## 2014-02-06 NOTE — Addendum Note (Signed)
Addended by: Laureen Abrahams on: 02/06/2014 05:50 PM   Modules accepted: Orders, Medications

## 2014-03-12 ENCOUNTER — Other Ambulatory Visit: Payer: Self-pay | Admitting: *Deleted

## 2014-03-12 DIAGNOSIS — C50411 Malignant neoplasm of upper-outer quadrant of right female breast: Secondary | ICD-10-CM

## 2014-03-12 MED ORDER — LETROZOLE 2.5 MG PO TABS: 2.5000 mg | ORAL_TABLET | Freq: Every day | ORAL | Status: DC

## 2014-03-13 ENCOUNTER — Other Ambulatory Visit: Payer: Self-pay

## 2014-03-13 NOTE — Telephone Encounter (Signed)
Refill request rcvd from [facility] dtd 03/13/14.  Per chart - refill escribe sent and confirmed receipt 03/12/14.  Per pharmacist, [facility] did receive refill request 03/12/14.

## 2014-03-21 ENCOUNTER — Ambulatory Visit: Payer: Medicare Other | Admitting: Neurology

## 2014-03-26 ENCOUNTER — Ambulatory Visit: Payer: Self-pay | Admitting: Neurology

## 2014-05-07 ENCOUNTER — Other Ambulatory Visit: Payer: Self-pay | Admitting: *Deleted

## 2014-05-07 DIAGNOSIS — C50411 Malignant neoplasm of upper-outer quadrant of right female breast: Secondary | ICD-10-CM

## 2014-05-07 MED ORDER — LETROZOLE 2.5 MG PO TABS
2.5000 mg | ORAL_TABLET | Freq: Every day | ORAL | Status: AC
Start: 2014-05-07 — End: ?

## 2014-05-23 ENCOUNTER — Ambulatory Visit (INDEPENDENT_AMBULATORY_CARE_PROVIDER_SITE_OTHER): Payer: Medicare Other | Admitting: Obstetrics & Gynecology

## 2014-05-23 ENCOUNTER — Encounter: Payer: Self-pay | Admitting: Obstetrics & Gynecology

## 2014-05-23 VITALS — BP 170/90 | Ht 64.0 in | Wt 157.0 lb

## 2014-05-23 DIAGNOSIS — N393 Stress incontinence (female) (male): Secondary | ICD-10-CM

## 2014-05-23 DIAGNOSIS — R32 Unspecified urinary incontinence: Secondary | ICD-10-CM

## 2014-05-23 LAB — POCT URINALYSIS DIPSTICK
Blood, UA: 4
Glucose, UA: NEGATIVE
KETONES UA: NEGATIVE
Nitrite, UA: POSITIVE
Protein, UA: 2

## 2014-05-23 MED ORDER — CIPROFLOXACIN HCL 500 MG PO TABS: 500.0000 mg | ORAL_TABLET | Freq: Two times a day (BID) | ORAL | Status: DC

## 2014-05-23 MED ORDER — MIRABEGRON ER 50 MG PO TB24: 50.0000 mg | ORAL_TABLET | Freq: Every day | ORAL | Status: DC

## 2014-05-23 NOTE — Progress Notes (Signed)
[name], female   DOB: 1941-04-25, 73 y.o.   MRN: [id number] Chief Complaint  Patient presents with  . GYN VISIT    Unable to control urine.    HPI Pt with increased frequency over the past 2 months, worsening now over the past week or so Burning also odor  ROS No discharge  No nausea, vomiting or diarrhea Nor fever chills or other constitutional symptoms   Blood pressure 170/90, height $RemoveBefore'5\' 4"'[id number]$  (1.626 m), weight 157 lb (71.215 kg).  EXAM Abdomen:      soft Vulva:            normal appearing vulva with no masses, tenderness or lesions Vagina:          normal mucosa, no discharge Cervix:           removed surgically Uterus:          uterus is normal size, shape, consistency and nontender, surgically absent, vaginal cuff well healed Adnexa:          Rectal:            Hemoccult:     performed                          Assessment/Plan:  UTI Detrussor instability  Ciprofloxacin 500 BID x 7days myrbetriq 50 mg nightly  Past Medical History  Diagnosis Date  . Cataract   . Hyperlipidemia   . History of neck surgery cervical laminectomy  . Urethral polyp removed AUG 2010  . Stress incontinence, female   . Migraines   . PONV (postoperative nausea and vomiting)   . H/O exercise stress test     done in PCP- office, maybe 15 yrs. ago   . Cancer     right breast  . Aromatase inhibitor use Nov. 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  Planned for "minimum of 5 years"  . Breast cancer 06/16/11    Right Breast     Past Surgical History  Procedure Laterality Date  . Cholecystectomy  1993  . Tubal ligation  1977  . Bladder surgery  2011    growth attached to bladder stem   . Bilateral salpingoophorectomy    . Ruptured disc surgery 1988  1988  . Kidney stones    . Modified mastectomy  12/28/2011    Procedure: MODIFIED MASTECTOMY;  Surgeon: Pedro Earls, MD;   Location: [place];  Service: General;  Laterality: Right;  Right modified mastectomy/   . Breast surgery  12/01/2011    Right breast lumpectomy  . Needle core biopsy  11/02/11    Left Breast Needle Core Biopsy, 9'Oclocl, No Malignancy: Biospy Axilla - Mammary Carcinoma, ER/PR Positive, Low Ki67  . Needle core biopsy  06/16/11    Right Breast - Invasive Mammary with Lobular features  . Abdominal hysterectomy  1997    complete    OB History   Grav Para Term Preterm Abortions TAB SAB Ect Mult Living   3 3             Obstetric Comments   Menarche age 35, Parity age 64, Gx, T1, Hysterectomy age 66, 21 ~ 71 years      Allergies  Allergen Reactions  . Vesicare [Solifenacin Succinate] Other (See Comments)    Headaches and upset stomach     History  Social History  . Marital Status: Married    Spouse Name: [name]    Number of Children: 1  . Years of Education: 12   Occupational History  . homemaker    Social History Main Topics  . Smoking status: Former Smoker -- 1.00 packs/day    Types: Cigarettes    Quit date: 07/07/1987  . Smokeless tobacco: Never Used  . Alcohol Use: 0.6 oz/week    1 Glasses of wine per week     Comment: socially  . Drug Use: No  . Sexual Activity: None   Other Topics Concern  . None   Social History Narrative         Married to [place] for 51 years. Patient is retired. Patient has a high school education.   Right handed.   Caffeine- tea and coffee daily.    Family History  Problem Relation Age of Onset  . Cancer Neg Hx   . Stroke Mother     in mid 69's

## 2014-05-23 NOTE — Addendum Note (Signed)
Addended by: Doyne Keel on: 05/23/2014 03:48 PM   Modules accepted: Orders

## 2014-05-26 LAB — URINE CULTURE: Colony Count: >=100000

## 2014-06-13 ENCOUNTER — Encounter: Payer: Self-pay | Admitting: Obstetrics & Gynecology

## 2014-06-13 ENCOUNTER — Ambulatory Visit (INDEPENDENT_AMBULATORY_CARE_PROVIDER_SITE_OTHER): Payer: Medicare Other | Admitting: Obstetrics & Gynecology

## 2014-06-13 VITALS — BP 170/90 | Wt 156.0 lb

## 2014-06-13 DIAGNOSIS — R319 Hematuria, unspecified: Secondary | ICD-10-CM

## 2014-06-13 DIAGNOSIS — N390 Urinary tract infection, site not specified: Secondary | ICD-10-CM

## 2014-06-13 DIAGNOSIS — R809 Proteinuria, unspecified: Secondary | ICD-10-CM

## 2014-06-13 LAB — POCT URINALYSIS DIPSTICK
GLUCOSE UA: NEGATIVE
Ketones, UA: NEGATIVE
NITRITE UA: NEGATIVE

## 2014-06-15 LAB — URINE CULTURE: Colony Count: 30000

## 2014-06-18 ENCOUNTER — Telehealth: Payer: Self-pay | Admitting: Obstetrics & Gynecology

## 2014-06-19 NOTE — Telephone Encounter (Signed)
Pt informed of negative urine culture results from 06/13/2014. Pt verbalized understanding.

## 2014-07-01 ENCOUNTER — Encounter: Payer: Self-pay | Admitting: Obstetrics & Gynecology

## 2014-07-02 ENCOUNTER — Ambulatory Visit (INDEPENDENT_AMBULATORY_CARE_PROVIDER_SITE_OTHER): Payer: Medicare Other | Admitting: Obstetrics & Gynecology

## 2014-07-02 ENCOUNTER — Encounter: Payer: Self-pay | Admitting: Obstetrics & Gynecology

## 2014-07-02 VITALS — BP 152/92 | Ht 65.0 in | Wt 150.5 lb

## 2014-07-02 DIAGNOSIS — R300 Dysuria: Secondary | ICD-10-CM

## 2014-07-02 DIAGNOSIS — R319 Hematuria, unspecified: Secondary | ICD-10-CM

## 2014-07-02 LAB — POCT URINALYSIS DIPSTICK
Blood, UA: 3
Glucose, UA: NEGATIVE
KETONES UA: NEGATIVE
Nitrite, UA: NEGATIVE
Protein, UA: 3

## 2014-07-02 MED ORDER — URIBEL 118 MG PO CAPS: 1.0000 | ORAL_CAPSULE | Freq: Four times a day (QID) | ORAL | Status: DC | PRN

## 2014-07-02 NOTE — Progress Notes (Signed)
[name], female   DOB: 1941-01-26, 73 y.o.   MRN: [id number]   Pt still with burning and irritation and urgwency symptoms Culture subsequently negative x 2 Will treat symptoms with pyridium and see if responds  Previous note: Pt has not responded to the myrbetriq in fact seems to be going more Will recheck a urine culture today Does have a small urethral polyp which is easily removed Stop myrbetriq Follow up culture and see back in 3 weeks  Past Medical History  Diagnosis Date  . Cataract   . Hyperlipidemia   . History of neck surgery cervical laminectomy  . Urethral polyp removed AUG 2010  . Stress incontinence, female   . Migraines   . PONV (postoperative nausea and vomiting)   . H/O exercise stress test     done in PCP- office, maybe 15 yrs. ago   . Cancer     right breast  . Aromatase inhibitor use Nov. 2012 -Continues     Neoadjuvant Letrozole for Invasive Mammary Carcinoma with Lobular features of the right Breast - Response. Planned for "minimum of 5 years"  . Breast cancer 06/16/11    Right Breast     Past Surgical History  Procedure Laterality Date  . Cholecystectomy  1993  . Tubal ligation  1977  . Bladder surgery  2011    growth attached to bladder stem   . Bilateral salpingoophorectomy    . Ruptured disc surgery 1988  1988  . Kidney stones    . Modified mastectomy  12/28/2011    Procedure: MODIFIED MASTECTOMY; Surgeon: Pedro Earls, MD; Location: [place]; Service: General; Laterality: Right; Right modified mastectomy/   . Breast surgery  12/01/2011    Right breast lumpectomy  . Needle core biopsy  11/02/11    Left Breast Needle Core Biopsy, 9'Oclocl, No Malignancy: Biospy Axilla - Mammary Carcinoma, ER/PR Positive, Low Ki67  . Needle core biopsy  06/16/11    Right Breast - Invasive Mammary with Lobular  features  . Abdominal hysterectomy  1997    complete    OB History    Gravida Para Term Preterm AB TAB SAB Ectopic Multiple Living   3 3              Obstetric Comments   Menarche age 42, Parity age 66, Gx, T39, Hysterectomy age 82, HRt ~ 20 years      Allergies  Allergen Reactions  . Vesicare [Solifenacin Succinate] Other (See Comments)    Headaches and upset stomach     History   Social History  . Marital Status: Married    Spouse Name: [name]    Number of Children: 1  . Years of Education: 12   Occupational History  . homemaker    Social History Main Topics  . Smoking status: Former Smoker -- 1.00 packs/day    Types: Cigarettes    Quit date: 07/07/1987  . Smokeless tobacco: Never Used  . Alcohol Use: 0.6 oz/week    1 Glasses of wine per week     Comment: socially  . Drug Use: No  . Sexual Activity: None   Other Topics Concern  . None   Social History Narrative         Married to [place] for 51 years. Patient is retired. Patient has a high school education.   Right handed.   Caffeine- tea and coffee daily.    Family History  Problem Relation Age of Onset  .  Cancer Neg Hx   . Stroke Mother     in mid 91's

## 2014-07-03 LAB — URINE CULTURE
COLONY COUNT: NO GROWTH
ORGANISM ID, BACTERIA: NO GROWTH

## 2014-07-04 ENCOUNTER — Telehealth: Payer: Self-pay | Admitting: Obstetrics & Gynecology

## 2014-07-04 ENCOUNTER — Telehealth: Payer: Self-pay | Admitting: *Deleted

## 2014-07-04 MED ORDER — PHENAZOPYRIDINE HCL 200 MG PO TABS: ORAL_TABLET | ORAL | Status: DC

## 2014-07-04 NOTE — Telephone Encounter (Signed)
Pt insurance will not cover Uribel cost $348.00 is there an alternative med?

## 2014-07-04 NOTE — Telephone Encounter (Signed)
Talked to pharmacist not really any help  Try OTC pyridium 2 TID as a trial then we will see what we can get done, I called Tedd and gave him those instructions

## 2014-07-11 ENCOUNTER — Telehealth: Payer: Self-pay | Admitting: Obstetrics & Gynecology

## 2014-07-11 NOTE — Telephone Encounter (Signed)
Pt husband, [name] has a question in regards to Pyridium Rx. Informed per Epic, pt to take Pyridium one tablet four times a day. Verbalized understanding.

## 2014-07-23 ENCOUNTER — Encounter: Payer: Self-pay | Admitting: Diagnostic Neuroimaging

## 2014-07-23 ENCOUNTER — Ambulatory Visit (INDEPENDENT_AMBULATORY_CARE_PROVIDER_SITE_OTHER): Payer: Medicare Other | Admitting: Diagnostic Neuroimaging

## 2014-07-23 VITALS — BP 146/79 | HR 93 | Temp 97.6°F | Ht 65.5 in | Wt 148.6 lb

## 2014-07-23 DIAGNOSIS — E538 Deficiency of other specified B group vitamins: Secondary | ICD-10-CM

## 2014-07-23 DIAGNOSIS — F03B0 Unspecified dementia, moderate, without behavioral disturbance, psychotic disturbance, mood disturbance, and anxiety: Secondary | ICD-10-CM

## 2014-07-23 DIAGNOSIS — F0280 Dementia in other diseases classified elsewhere without behavioral disturbance: Secondary | ICD-10-CM

## 2014-07-23 DIAGNOSIS — G20 Parkinson's disease: Secondary | ICD-10-CM

## 2014-07-23 DIAGNOSIS — G3183 Dementia with Lewy bodies: Secondary | ICD-10-CM

## 2014-07-23 DIAGNOSIS — F03C0 Unspecified dementia, severe, without behavioral disturbance, psychotic disturbance, mood disturbance, and anxiety: Secondary | ICD-10-CM | POA: Insufficient documentation

## 2014-07-23 DIAGNOSIS — F0390 Unspecified dementia without behavioral disturbance: Secondary | ICD-10-CM

## 2014-07-23 MED ORDER — DONEPEZIL HCL 10 MG PO TABS: 10.0000 mg | ORAL_TABLET | Freq: Every day | ORAL | Status: DC

## 2014-07-23 MED ORDER — DONEPEZIL HCL 5 MG PO TABS: 5.0000 mg | ORAL_TABLET | Freq: Every day | ORAL | Status: DC

## 2014-07-23 NOTE — Patient Instructions (Signed)
Use rollator walker.  Try physical therapy.  Start donepezil 5mg  at bedtime. X 1 month; then increase to 10mg  at bedtime.
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
## 2014-07-30 ENCOUNTER — Encounter: Payer: Self-pay | Admitting: Obstetrics & Gynecology

## 2014-07-30 ENCOUNTER — Ambulatory Visit (INDEPENDENT_AMBULATORY_CARE_PROVIDER_SITE_OTHER): Payer: Medicare Other | Admitting: Obstetrics & Gynecology

## 2014-07-30 VITALS — BP 138/80 | Ht 65.0 in | Wt 149.0 lb

## 2014-07-30 DIAGNOSIS — N3281 Overactive bladder: Secondary | ICD-10-CM

## 2014-07-30 DIAGNOSIS — N318 Other neuromuscular dysfunction of bladder: Secondary | ICD-10-CM

## 2014-07-30 MED ORDER — PHENAZOPYRIDINE HCL 200 MG PO TABS: 200.0000 mg | ORAL_TABLET | Freq: Three times a day (TID) | ORAL | Status: DC | PRN

## 2014-07-30 NOTE — Progress Notes (Signed)
[name], female   DOB: 1941-06-03, 73 y.o.   MRN: [id number] Pt with recent UTI but was left with urgency frequency symptoms even after 2 negative TOC cultures  Started her on pyridiuim 200 for symptoms and that has been effective Will continue  Follow up prn   No burning with urination, frequency or urgency No nausea, vomiting or diarrhea Nor fever chills or other constitutional symptoms

## 2014-08-19 ENCOUNTER — Ambulatory Visit (HOSPITAL_COMMUNITY)
Admission: RE | Admit: 2014-08-19 | Discharge: 2014-08-19 | Disposition: A | Discharge status: Home / Self Care | Payer: Medicare Other | Source: Ambulatory Visit | Attending: Diagnostic Neuroimaging | Admitting: Diagnostic Neuroimaging

## 2014-08-19 DIAGNOSIS — G3183 Dementia with Lewy bodies: Secondary | ICD-10-CM

## 2014-08-19 DIAGNOSIS — R2689 Other abnormalities of gait and mobility: Secondary | ICD-10-CM | POA: Diagnosis not present

## 2014-08-19 DIAGNOSIS — R269 Unspecified abnormalities of gait and mobility: Secondary | ICD-10-CM

## 2014-08-19 DIAGNOSIS — R29898 Other symptoms and signs involving the musculoskeletal system: Secondary | ICD-10-CM

## 2014-08-19 DIAGNOSIS — G20 Parkinson's disease: Secondary | ICD-10-CM | POA: Insufficient documentation

## 2014-08-19 DIAGNOSIS — M6281 Muscle weakness (generalized): Secondary | ICD-10-CM | POA: Insufficient documentation

## 2014-08-19 DIAGNOSIS — Z5189 Encounter for other specified aftercare: Secondary | ICD-10-CM | POA: Diagnosis present

## 2014-08-19 NOTE — Therapy (Signed)
[place], [place], [address] Phone: [phone]   Fax:  [phone]  Physical Therapy Evaluation  Patient Details  Name: Shelly Willis MRN: [id number] Date of Birth: 12-20-1940  Encounter Date: 08/19/2014      PT End of Session - 08/19/14 1614    Visit Number 1   Number of Visits 8   Date for PT Re-Evaluation 09/18/14   Authorization Type [employer] medicare   PT Start Time 1440   PT Stop Time 0814   PT Time Calculation (min) 37 min   Activity Tolerance Patient tolerated treatment well      Past Medical History  Diagnosis Date  . Cataract   . Hyperlipidemia   . History of neck surgery cervical laminectomy  . Urethral polyp removed AUG 2010  . Stress incontinence, female   . Migraines   . PONV (postoperative nausea and vomiting)   . H/O exercise stress test     done in PCP- office, maybe 15 yrs. ago   . Cancer     right breast  . Aromatase inhibitor use Nov. 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  Planned for "minimum of 5 years"  . Breast cancer 06/16/11    Right Breast     Past Surgical History  Procedure Laterality Date  . Cholecystectomy  1993  . Tubal ligation  1977  . Bladder surgery  2011    growth attached to bladder stem   . Bilateral salpingoophorectomy    . Ruptured disc surgery 1988  1988  . Kidney stones    . Modified mastectomy  12/28/2011    Procedure: MODIFIED MASTECTOMY;  Surgeon: Pedro Earls, MD;  Location: [facility];  Service: General;  Laterality: Right;  Right modified mastectomy/   . Breast surgery  12/01/2011    Right breast lumpectomy  . Needle core biopsy  11/02/11    Left Breast Needle Core Biopsy, 9'Oclocl, No Malignancy: Biospy Axilla - Mammary Carcinoma, ER/PR Positive, Low Ki67  . Needle core biopsy  06/16/11    Right Breast - Invasive Mammary with Lobular  features  . Abdominal hysterectomy  1997    complete    There were no vitals taken for this visit.  Visit Diagnosis:  Altered gait  Poor balance  Bilateral leg weakness  Parkinson's disease, Lewy body      Subjective Assessment - 08/19/14 1447    Symptoms Ms. Owen states that she has started falling over the past six months and her legs just feel heavy.  She has been referred to physical therapy to attempt to improve her gait and strength.    How long can you stand comfortably? Pt becomes tired after standing for 15  minutes    How long can you walk comfortably? Pt has not walked since August; only walking in the house    Diagnostic tests Pt has had breast cancer with radiation    Currently in Pain? No/denies          [facility] PT Assessment - 08/19/14 1455    Assessment   Medical Diagnosis Parkinson   Onset Date 06/30/14   Next MD Visit 10/23/2013   Prior Therapy none   Precautions   Precautions Fall   Restrictions   Weight Bearing Restrictions No   Balance Screen   Has the patient fallen in the past 6 months Yes  How many times? 2   Has the patient had a decrease in activity level because of a fear of falling?  Yes   Is the patient reluctant to leave their home because of a fear of falling?  No   Prior Function   Level of Independence Independent with basic ADLs   Observation/Other Assessments   Focus on Therapeutic Outcomes (FOTO)  55   Sit to Stand   Comments 7 in 30 seconds    Strength   Right Hip Flexion --  4-/5   Right Hip Extension 3+/5   Right Hip ABduction 5/5   Left Hip Flexion --  4-/5   Left Hip Extension 3+/5   Right Knee Flexion 5/5   Right Knee Extension 5/5   Left Knee Flexion 5/5   Left Knee Extension 5/5   Right Ankle Dorsiflexion 4/5   Left Ankle Dorsiflexion 4/5   Balance   Balance Assessed --  SLS Rt 5 seconds Lt 7    Functional Gait  Assessment   Gait assessed  Yes  decreased stride length B; decreased dorsiflexion B                    [facility] Adult PT Treatment/Exercise - 08/19/14 1612    Exercises   Exercises Knee/Hip   Knee/Hip Exercises: Standing   SLS x2 B   Knee/Hip Exercises: Seated   Other Seated Knee Exercises ankle Dorsiflexion/plantarflexion x 10    Other Seated Knee Exercises sit to stand x 10   Knee/Hip Exercises: Prone   Hip Extension 10 reps                  PT Short Term Goals - 08/19/14 1623    PT SHORT TERM GOAL #1   Title I in HEP   Time 1   Period Weeks   PT SHORT TERM GOAL #2   Title Pt to be able to SLS for 10 seconds B in order to reduce risk of falling   Time 2   Period Weeks   PT SHORT TERM GOAL #3   Title Pt to be able to complete 10 sit to stand in 30 seconds to demonstrate improved power.    Time 2   Period Weeks           PT Long Term Goals - 08/19/14 1624    PT LONG TERM GOAL #1   Title Pt to be I in advance HEP   Time 4   Period Weeks   PT LONG TERM GOAL #2   Title Pt to be able to SLS x 15 seconds B in order to reduce risk of falling    Time 4   Period Weeks   PT LONG TERM GOAL #3   Title Pt to be able to stand for 30 minutes without fatigue    Time 4   Period Weeks   PT LONG TERM GOAL #4   Title Pt to be walking for her health at least ten minutes a day at least 4 days a week    Time 4   Period Weeks   PT LONG TERM GOAL #5   Title Pt to demonstrate improved gait with increased dorsiflexion and increased stride length.                Plan - 08/19/14 1615    Clinical Impression Statement Pt is a 73 yo female who has recenlty been diagnosed with Parkinson with gait abnormalities.  Pt  has been referred and will benefit from skilled PT to address the following deficits found in her examination: decrreased balance, decreeased mm strength; gait abnormality  and decreased endurance.     Pt will benefit from skilled therapeutic intervention in order to improve on the following deficits Abnormal gait;Decreased activity  tolerance;Decreased balance;Decreased strength   Rehab Potential Good   PT Frequency 2x / week   PT Duration 4 weeks   PT Treatment/Interventions Gait training;Therapeutic activities;Therapeutic exercise;Balance training;Neuromuscular re-education   PT Next Visit Plan begin cone rotation, marcihing, heel raise/toe raise usin reciprocal pattern, heel raise combined with functional squat progressing to higher level activites such as lunge walking.  Use the "Big and Loud" principle while treating patient.   PT Home Exercise Plan given          G-Codes - 15-Sep-2014 1630    Functional Assessment Tool Used foto   Functional Limitation Mobility: Walking and moving around   Mobility: Walking and Moving Around Current Status [phone]) At least 40 percent but less than 60 percent impaired, limited or restricted   Mobility: Walking and Moving Around Goal Status [phone]) At least 20 percent but less than 40 percent impaired, limited or restricted       Problem List Patient Active Problem List   Diagnosis Date Noted  . 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 Moderate dementia without behavioral disturbance 07/23/2014  . Breast cancer of upper-outer quadrant of right female breast 06/05/2013  . Cognitive decline 03/20/2013  . Parkinsonism 03/20/2013  . 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 Right mastectomy April 2013 12/09/2011    Jamell Opfer,CINDY PT 09/15/2014, 4:32 PM  [facility] [address] [place], [place], [address] Phone: [phone]   Fax:  [phone]

## 2014-08-19 NOTE — Patient Instructions (Addendum)
Toe / Heel Raise (Sitting)   Sitting, raise heels, then rock back on heels and raise toes. Repeat _15___ times.  Copyright  VHI. All rights reserved.  Straight Leg Raise   Tighten stomach and slowly raise locked right leg _18___ inches from floor. Repeat __10-15__ times per set. Do __1__ sets per session. Do ____2 sessions per day.  http://orth.exer.us/1102   Copyright  VHI. All rights reserved.  Straight Leg Raise (Prone)   Abdomen and head supported, keep left knee locked and raise leg at hip. Avoid arching low back. Repeat __10__ times per set. Do ___1_ sets per session. Do __2__ sessions per day.  http://orth.exer.us/1112   Copyright  VHI. All rights reserved.

## 2014-08-20 ENCOUNTER — Ambulatory Visit (HOSPITAL_COMMUNITY): Payer: Medicare Other | Admitting: Physical Therapy

## 2014-08-26 ENCOUNTER — Ambulatory Visit (HOSPITAL_COMMUNITY)
Admission: RE | Admit: 2014-08-26 | Discharge: 2014-08-26 | Disposition: A | Discharge status: Home / Self Care | Payer: Medicare Other | Source: Ambulatory Visit | Attending: Diagnostic Neuroimaging | Admitting: Diagnostic Neuroimaging

## 2014-08-26 DIAGNOSIS — R2689 Other abnormalities of gait and mobility: Secondary | ICD-10-CM

## 2014-08-26 DIAGNOSIS — R269 Unspecified abnormalities of gait and mobility: Secondary | ICD-10-CM

## 2014-08-26 DIAGNOSIS — Z5189 Encounter for other specified aftercare: Secondary | ICD-10-CM | POA: Diagnosis not present

## 2014-08-26 DIAGNOSIS — G3183 Dementia with Lewy bodies: Secondary | ICD-10-CM

## 2014-08-26 DIAGNOSIS — R29898 Other symptoms and signs involving the musculoskeletal system: Secondary | ICD-10-CM

## 2014-08-26 NOTE — Addendum Note (Signed)
Encounter addended by: Leeroy Cha, PT on: 08/26/2014  4:18 PM<BR>     Documentation filed: Clinical Notes, Inpatient Document Flowsheet

## 2014-08-26 NOTE — Therapy (Addendum)
[facility] [address] [place], [place], [address] Phone: [phone]   Fax:  [phone]  Physical Therapy Treatment  Patient Details  Name: Shelly Willis MRN: [id number] Date of Birth: [birth year]  Encounter Date: 08/26/2014      PT End of Session - 08/26/14 1431    Visit Number 2   Number of Visits 8   Date for PT Re-Evaluation 09/18/14   Authorization Type [employer] medicare   PT Start Time 9470   PT Stop Time 1435   PT Time Calculation (min) 50 min      Past Medical History  Diagnosis Date  . Cataract   . Hyperlipidemia   . History of neck surgery cervical laminectomy  . Urethral polyp removed AUG 2010  . Stress incontinence, female   . Migraines   . PONV (postoperative nausea and vomiting)   . H/O exercise stress test     done in PCP- office, maybe 15 yrs. ago   . Cancer     right breast  . Aromatase inhibitor use Nov. 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  Planned for "minimum of 5 years"  . Breast cancer 06/16/11    Right Breast     Past Surgical History  Procedure Laterality Date  . Cholecystectomy  1993  . Tubal ligation  1977  . Bladder surgery  2011    growth attached to bladder stem   . Bilateral salpingoophorectomy    . Ruptured disc surgery 1988  1988  . Kidney stones    . Modified mastectomy  12/28/2011    Procedure: MODIFIED MASTECTOMY;  Surgeon: Pedro Earls, MD;  Location: [facility];  Service: General;  Laterality: Right;  Right modified mastectomy/   . Breast surgery  12/01/2011    Right breast lumpectomy  . Needle core biopsy  11/02/11    Left Breast Needle Core Biopsy, 9'Oclocl, No Malignancy: Biospy Axilla - Mammary Carcinoma, ER/PR Positive, Low Ki67  . Needle core biopsy  06/16/11    Right Breast - Invasive Mammary with Lobular features  . Abdominal hysterectomy  1997    complete     There were no vitals taken for this visit.  Visit Diagnosis:  Altered gait  Poor balance  Bilateral leg weakness  Parkinson's disease, Lewy body      Subjective Assessment - 08/26/14 1427    Symptoms Shelly Willis states that she has been completing her exercise program    Currently in Pain? No/denies                    [facility] Adult PT Treatment/Exercise - 08/26/14 0001    Exercises   Exercises Balance   Knee/Hip Exercises: Stretches   Gastroc Stretch 3 reps;30 seconds   Knee/Hip Exercises: Machines for Strengthening   Total Gym Leg Press nustep hills L3 x 8:00   Balance Exercises   Sidestepping 2 reps;Theraband   Theraband Level (Sidestepping) Level 4 (Blue)   Tandem Walking 2 round trips   standing --  standing at wall with shoulders back B UE flexion x 10   Tandem Walking Limitations on foam   Rotation with [place] 1 RT   March on Foam/Wedge 10 reps   March on Foam/Wedge Limitations no foam    [employer] walk "Big and Loud" pt vocalizing 2 RT x 30FT    Heel Raises 10  reps   Heel Raises Limitations reciprocal with toe raises   Toe Raise 10 reps                  PT Short Term Goals - 08/19/14 1623    PT SHORT TERM GOAL #1   Title I in HEP   Time 1   Period Weeks   PT SHORT TERM GOAL #2   Title Pt to be able to SLS for 10 seconds B in order to reduce risk of falling   Time 2   Period Weeks   PT SHORT TERM GOAL #3   Title Pt to be able to complete 10 sit to stand in 30 seconds to demonstrate improved power.    Time 2   Period Weeks           PT Long Term Goals - 08/19/14 1624    PT LONG TERM GOAL #1   Title Pt to be I in advance HEP   Time 4   Period Weeks   PT LONG TERM GOAL #2   Title Pt to be able to SLS x 15 seconds B in order to reduce risk of falling    Time 4   Period Weeks   PT LONG TERM GOAL #3   Title Pt to be able to stand for 30 minutes without fatigue    Time 4   Period Weeks   PT LONG TERM GOAL #4    Title Pt to be walking for her health at least ten minutes a day at least 4 days a week    Time 4   Period Weeks   PT LONG TERM GOAL #5   Title Pt to demonstrate improved gait with increased dorsiflexion and increased stride length.                Plan - 08/26/14 1433    Clinical Impression Statement All exercises were new to patient needing therapist facilitation.   Pt needed verbal and tactile cuing for monster walk  for big and loud activity.   PT Next Visit Plan begin lunge walking; braiding and sit to stand activity.         Problem List Patient Active Problem List   Diagnosis Date Noted  . 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 Moderate dementia without behavioral disturbance 07/23/2014  . Breast cancer of upper-outer quadrant of right female breast 06/05/2013  . Cognitive decline 03/20/2013  . Parkinsonism 03/20/2013  . 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 Right mastectomy April 2013 12/09/2011    Jams Trickett,CINDY PT 08/26/2014, 4:18 PM  [facility] [address] [place], [place], [address] Phone: [phone]   Fax:  [phone]

## 2014-09-03 ENCOUNTER — Ambulatory Visit (HOSPITAL_COMMUNITY)
Admission: RE | Admit: 2014-09-03 | Discharge: 2014-09-03 | Disposition: A | Discharge status: Home / Self Care | Payer: Medicare Other | Source: Ambulatory Visit | Attending: General Surgery | Admitting: General Surgery

## 2014-09-03 DIAGNOSIS — M6281 Muscle weakness (generalized): Secondary | ICD-10-CM | POA: Diagnosis not present

## 2014-09-03 DIAGNOSIS — R269 Unspecified abnormalities of gait and mobility: Secondary | ICD-10-CM

## 2014-09-03 DIAGNOSIS — R29898 Other symptoms and signs involving the musculoskeletal system: Secondary | ICD-10-CM

## 2014-09-03 DIAGNOSIS — Z5189 Encounter for other specified aftercare: Secondary | ICD-10-CM | POA: Diagnosis not present

## 2014-09-03 DIAGNOSIS — G3183 Dementia with Lewy bodies: Secondary | ICD-10-CM

## 2014-09-03 DIAGNOSIS — G20 Parkinson's disease: Secondary | ICD-10-CM | POA: Diagnosis not present

## 2014-09-03 DIAGNOSIS — R2689 Other abnormalities of gait and mobility: Secondary | ICD-10-CM | POA: Insufficient documentation

## 2014-09-03 NOTE — Therapy (Signed)
[facility] [address] [place], [place], [address] Phone: [phone]   Fax:  [phone]  Physical Therapy Treatment  Patient Details  Name: Shelly Willis MRN: [id number] Date of Birth: Feb 12, 1941  Encounter Date: 09/03/2014      PT End of Session - 09/03/14 1430    Visit Number 3   Number of Visits 8   Date for PT Re-Evaluation 09/18/14   Authorization Type [employer] medicare   PT Start Time 1350   PT Stop Time 1434   PT Time Calculation (min) 44 min   Equipment Utilized During Treatment Gait belt   Activity Tolerance Patient tolerated treatment well   Behavior During Therapy [facility] for tasks assessed/performed      Past Medical History  Diagnosis Date  . Cataract   . Hyperlipidemia   . History of neck surgery cervical laminectomy  . Urethral polyp removed AUG 2010  . Stress incontinence, female   . Migraines   . PONV (postoperative nausea and vomiting)   . H/O exercise stress test     done in PCP- office, maybe 15 yrs. ago   . Cancer     right breast  . Aromatase inhibitor use Nov. 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  Planned for "minimum of 5 years"  . Breast cancer 06/16/11    Right Breast     Past Surgical History  Procedure Laterality Date  . Cholecystectomy  1993  . Tubal ligation  1977  . Bladder surgery  2011    growth attached to bladder stem   . Bilateral salpingoophorectomy    . Ruptured disc surgery 1988  1988  . Kidney stones    . Modified mastectomy  12/28/2011    Procedure: MODIFIED MASTECTOMY;  Surgeon: Pedro Earls, MD;  Location: [facility];  Service: General;  Laterality: Right;  Right modified mastectomy/   . Breast surgery  12/01/2011    Right breast lumpectomy  . Needle core biopsy  11/02/11    Left Breast Needle Core Biopsy, 9'Oclocl, No Malignancy: Biospy Axilla - Mammary Carcinoma,  ER/PR Positive, Low Ki67  . Needle core biopsy  06/16/11    Right Breast - Invasive Mammary with Lobular features  . Abdominal hysterectomy  1997    complete    There were no vitals taken for this visit.  Visit Diagnosis:  Altered gait  Poor balance  Bilateral leg weakness  Parkinson's disease, Lewy body        [facility] PT Assessment - 09/03/14 1354    Assessment   Medical Diagnosis Parkinson   Onset Date 06/30/14   Next MD Visit 10/23/2013   Prior Therapy none                  [facility] Adult PT Treatment/Exercise - 09/03/14 1351    Knee/Hip Exercises: Stretches   Gastroc Stretch 3 reps;30 seconds   Gastroc Stretch Limitations Slantboard   Knee/Hip Exercises: Field seismologist for Strengthening   Total Gym [place] #5, Lv 3 5' ave 56 spm   Knee/Hip Exercises: Standing   Lunge Walking - Round Trips 30' with cueing for technique   Gait Training [place] 15' RT   Knee/Hip Exercises: Seated   Other Seated Knee Exercises STS 3x5, no UE   Balance Exercises: Standing   Retro Gait 2 reps  tandem   Sidestepping 2 reps;Limitations  Sidestepping Limitations with UE flexion, then abduction each lap   Heel Raises 20 reps   Heel Raises Limitations rocking with HR/TR   Ankle Exercises: Standing   Toe Raise 20 reps   Balance Exercises   Tandem Walking 2 round trips   March on Foam/Wedge 5 reps   March on Foam/Wedge Limitations with alternating UE flexion                  PT Short Term Goals - 08/19/14 1623    PT SHORT TERM GOAL #1   Title I in HEP   Time 1   Period Weeks   PT SHORT TERM GOAL #2   Title Pt to be able to SLS for 10 seconds B in order to reduce risk of falling   Time 2   Period Weeks   PT SHORT TERM GOAL #3   Title Pt to be able to complete 10 sit to stand in 30 seconds to demonstrate improved power.    Time 2   Period Weeks           PT Long Term Goals - 08/19/14 1624    PT LONG TERM GOAL #1   Title Pt to be I in advance HEP    Time 4   Period Weeks   PT LONG TERM GOAL #2   Title Pt to be able to SLS x 15 seconds B in order to reduce risk of falling    Time 4   Period Weeks   PT LONG TERM GOAL #3   Title Pt to be able to stand for 30 minutes without fatigue    Time 4   Period Weeks   PT LONG TERM GOAL #4   Title Pt to be walking for her health at least ten minutes a day at least 4 days a week    Time 4   Period Weeks   PT LONG TERM GOAL #5   Title Pt to demonstrate improved gait with increased dorsiflexion and increased stride length.                Plan - 09/03/14 1432    Clinical Impression Statement Continued exercise program, focusing on increasing opposition movement and dissociation of UE and LE.  During strengtheing exercises (lunges, marching) UE movements added to increase movement patterns from hx of PD.  tactile and verbal cueing was required with reciprocal gait exercises to appropriately coordinate movements.    Pt will benefit from skilled therapeutic intervention in order to improve on the following deficits Abnormal gait;Decreased activity tolerance;Decreased balance;Decreased strength   Rehab Potential Good   PT Frequency 2x / week   PT Duration 4 weeks   PT Treatment/Interventions Gait training;Therapeutic activities;Therapeutic exercise;Balance training;Neuromuscular re-education   PT Next Visit Plan Continue exercises, working on increasing step length forward and backward and decreasing assistance with reciprocal movements.         Problem List Patient Active Problem List   Diagnosis Date Noted  . 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 Moderate dementia without behavioral disturbance 07/23/2014  . Breast cancer of upper-outer quadrant of right female breast 06/05/2013  . Cognitive decline 03/20/2013  . Parkinsonism 03/20/2013  . 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 Right mastectomy April 2013 12/09/2011   Lonna Cobb, DPT [phone]  [facility] [address] [place], [place], [address] Phone: [phone]   Fax:  [phone]

## 2014-09-05 ENCOUNTER — Ambulatory Visit (HOSPITAL_COMMUNITY)
Admission: RE | Admit: 2014-09-05 | Discharge: 2014-09-05 | Disposition: A | Discharge status: Home / Self Care | Payer: Medicare Other | Source: Ambulatory Visit | Attending: General Surgery | Admitting: General Surgery

## 2014-09-05 DIAGNOSIS — R269 Unspecified abnormalities of gait and mobility: Secondary | ICD-10-CM

## 2014-09-05 DIAGNOSIS — G3183 Dementia with Lewy bodies: Secondary | ICD-10-CM

## 2014-09-05 DIAGNOSIS — R2689 Other abnormalities of gait and mobility: Secondary | ICD-10-CM | POA: Diagnosis not present

## 2014-09-05 DIAGNOSIS — Z5189 Encounter for other specified aftercare: Secondary | ICD-10-CM | POA: Diagnosis not present

## 2014-09-05 DIAGNOSIS — M6281 Muscle weakness (generalized): Secondary | ICD-10-CM | POA: Diagnosis not present

## 2014-09-05 DIAGNOSIS — G20 Parkinson's disease: Secondary | ICD-10-CM | POA: Diagnosis not present

## 2014-09-05 DIAGNOSIS — F0280 Dementia in other diseases classified elsewhere without behavioral disturbance: Secondary | ICD-10-CM

## 2014-09-05 DIAGNOSIS — R29898 Other symptoms and signs involving the musculoskeletal system: Secondary | ICD-10-CM

## 2014-09-05 NOTE — Therapy (Signed)
[facility] [place], [place], [address] Phone: [phone]   Fax:  [phone]  Physical Therapy Treatment  Patient Details  Name: Shelly Willis MRN: [id number] Date of Birth: [birth year] Referring Provider:  Alonza Bogus, MD  Encounter Date: 09/05/2014      PT End of Session - 09/05/14 1523    Visit Number 4   Number of Visits 8   Date for PT Re-Evaluation 09/18/14   Authorization Type [employer] medicare   Authorization - Visit Number 4   Authorization - Number of Visits 10   PT Start Time 5681   PT Stop Time 1440   PT Time Calculation (min) 55 min   Equipment Utilized During Treatment Gait belt   Activity Tolerance Patient tolerated treatment well   Behavior During Therapy [facility] for tasks assessed/performed      Past Medical History  Diagnosis Date  . Cataract   . Hyperlipidemia   . History of neck surgery cervical laminectomy  . Urethral polyp removed AUG 2010  . Stress incontinence, female   . Migraines   . PONV (postoperative nausea and vomiting)   . H/O exercise stress test     done in PCP- office, maybe 15 yrs. ago   . Cancer     right breast  . Aromatase inhibitor use Nov. 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  Planned for "minimum of 5 years"  . Breast cancer 06/16/11    Right Breast     Past Surgical History  Procedure Laterality Date  . Cholecystectomy  1993  . Tubal ligation  1977  . Bladder surgery  2011    growth attached to bladder stem   . Bilateral salpingoophorectomy    . Ruptured disc surgery 1988  1988  . Kidney stones    . Modified mastectomy  12/28/2011    Procedure: MODIFIED MASTECTOMY;  Surgeon: Pedro Earls, MD;  Location: [facility];  Service: General;  Laterality: Right;  Right modified mastectomy/   . Breast surgery  12/01/2011    Right breast lumpectomy  . Needle  core biopsy  11/02/11    Left Breast Needle Core Biopsy, 9'Oclocl, No Malignancy: Biospy Axilla - Mammary Carcinoma, ER/PR Positive, Low Ki67  . Needle core biopsy  06/16/11    Right Breast - Invasive Mammary with Lobular features  . Abdominal hysterectomy  1997    complete    There were no vitals taken for this visit.  Visit Diagnosis:  Altered gait  Poor balance  Bilateral leg weakness  Parkinson's disease, Lewy body      Subjective Assessment - 09/05/14 1540    Symptoms Pt reports no pain or soreness today.  Pt asleep on husbands shoulder when called her from the waiting room   Currently in Pain? No/denies             [facility] Adult PT Treatment/Exercise - 09/05/14 1351    Exercises   Exercises Balance   Knee/Hip Exercises: Stretches   Gastroc Stretch 3 reps;30 seconds   Gastroc Stretch Limitations Slantboard   Knee/Hip Exercises: Field seismologist for [employer] Gym [place] #5, Lv 3 10'   Knee/Hip Exercises: Standing   Lunge Walking - Round Trips 1/2RT long hall' with cueing for technique   Gait Training [place] 1' RT   Knee/Hip Exercises: Seated   Other Seated  Knee Exercises STS 15x, no UE standard chair   Balance Exercises: Standing   Retro Gait 2 reps   Heel Raises Limitations rocking with HR/TR   Balance Exercises   Sidestepping Limitations;1 rep   Theraband Level (Sidestepping) Level 2 (Red)   Sidestepping Limitations long hallway with UE movments   Tandem Walking 1 round trip   Tandem Walking Limitations long hallway   March on Foam/Wedge 10 reps   March on Foam/Wedge Limitations with alternating UE flexion   Heel Raises 20 reps   Toe Raise 20 reps                  PT Short Term Goals - 08/19/14 1623    PT SHORT TERM GOAL #1   Title I in HEP   Time 1   Period Weeks   PT SHORT TERM GOAL #2   Title Pt to be able to SLS for 10 seconds B in order to reduce risk of falling   Time 2   Period Weeks   PT SHORT TERM GOAL #3    Title Pt to be able to complete 10 sit to stand in 30 seconds to demonstrate improved power.    Time 2   Period Weeks           PT Long Term Goals - 08/19/14 1624    PT LONG TERM GOAL #1   Title Pt to be I in advance HEP   Time 4   Period Weeks   PT LONG TERM GOAL #2   Title Pt to be able to SLS x 15 seconds B in order to reduce risk of falling    Time 4   Period Weeks   PT LONG TERM GOAL #3   Title Pt to be able to stand for 30 minutes without fatigue    Time 4   Period Weeks   PT LONG TERM GOAL #4   Title Pt to be walking for her health at least ten minutes a day at least 4 days a week    Time 4   Period Weeks   PT LONG TERM GOAL #5   Title Pt to demonstrate improved gait with increased dorsiflexion and increased stride length.                Plan - 09/05/14 1528    Clinical Impression Statement Pt with most diffculty completing UE/LE coordinating tasks requiring max tactile and verbal cues.  Frequent cues to increase step height and stride with ambulation as well as posture.     PT Next Visit Plan Continue exercises, working on increasing step length forward and backward and decreasing assistance with reciprocal movements.   Progress with agility ladder with coordinating UE movments.         Problem List Patient Active Problem List   Diagnosis Date Noted  . 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 Moderate dementia without behavioral disturbance 07/23/2014  . Breast cancer of upper-outer quadrant of right female breast 06/05/2013  . Cognitive decline 03/20/2013  . Parkinsonism 03/20/2013  . 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 Right mastectomy April 2013 12/09/2011    Teena Irani, PTA/CLT [phone] 09/05/2014, 3:41 PM  [facility] [address] [place], [place], [address] Phone: [phone]   Fax:  [phone]

## 2014-09-10 ENCOUNTER — Ambulatory Visit (HOSPITAL_COMMUNITY)
Admission: RE | Admit: 2014-09-10 | Discharge: 2014-09-10 | Disposition: A | Discharge status: Home / Self Care | Payer: Medicare Other | Source: Ambulatory Visit | Attending: General Surgery | Admitting: General Surgery

## 2014-09-10 DIAGNOSIS — R2689 Other abnormalities of gait and mobility: Secondary | ICD-10-CM | POA: Diagnosis not present

## 2014-09-10 DIAGNOSIS — G20 Parkinson's disease: Secondary | ICD-10-CM | POA: Diagnosis not present

## 2014-09-10 DIAGNOSIS — F0280 Dementia in other diseases classified elsewhere without behavioral disturbance: Secondary | ICD-10-CM

## 2014-09-10 DIAGNOSIS — R29898 Other symptoms and signs involving the musculoskeletal system: Secondary | ICD-10-CM

## 2014-09-10 DIAGNOSIS — G3183 Dementia with Lewy bodies: Secondary | ICD-10-CM

## 2014-09-10 DIAGNOSIS — M6281 Muscle weakness (generalized): Secondary | ICD-10-CM | POA: Diagnosis not present

## 2014-09-10 DIAGNOSIS — Z5189 Encounter for other specified aftercare: Secondary | ICD-10-CM | POA: Diagnosis not present

## 2014-09-10 DIAGNOSIS — R269 Unspecified abnormalities of gait and mobility: Secondary | ICD-10-CM

## 2014-09-10 NOTE — Progress Notes (Signed)
[name], female   DOB: 12-03-1940, 74 y.o.   MRN: [id number] Pt has not responded to the myrbetriq in fact seems to be going more Will recheck a urine culture today Does have a small urethral polyp which is easily removed Stop myrbetriq Follow up culture and see back in 3 weeks  Past Medical History  Diagnosis Date  . Cataract   . Hyperlipidemia   . History of neck surgery cervical laminectomy  . Urethral polyp removed AUG 2010  . Stress incontinence, female   . Migraines   . PONV (postoperative nausea and vomiting)   . H/O exercise stress test     done in PCP- office, maybe 15 yrs. ago   . Cancer     right breast  . Aromatase inhibitor use Nov. 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  Planned for "minimum of 5 years"  . Breast cancer 06/16/11    Right Breast     Past Surgical History  Procedure Laterality Date  . Cholecystectomy  1993  . Tubal ligation  1977  . Bladder surgery  2011    growth attached to bladder stem   . Bilateral salpingoophorectomy    . Ruptured disc surgery 1988  1988  . Kidney stones    . Modified mastectomy  12/28/2011    Procedure: MODIFIED MASTECTOMY;  Surgeon: Pedro Earls, MD;  Location: [facility];  Service: General;  Laterality: Right;  Right modified mastectomy/   . Breast surgery  12/01/2011    Right breast lumpectomy  . Needle core biopsy  11/02/11    Left Breast Needle Core Biopsy, 9'Oclocl, No Malignancy: Biospy Axilla - Mammary Carcinoma, ER/PR Positive, Low Ki67  . Needle core biopsy  06/16/11    Right Breast - Invasive Mammary with Lobular features  . Abdominal hysterectomy  1997    complete    OB History    Gravida Para Term Preterm AB TAB SAB Ectopic Multiple Living   3 3              Obstetric Comments   Menarche age 76, Parity age 40, Gx, T24, Hysterectomy age 71, HRt ~ 20 years      Allergies   Allergen Reactions  . Vesicare [Solifenacin Succinate] Other (See Comments)    Headaches and upset stomach     History   Social History  . Marital Status: Married    Spouse Name: [name]    Number of Children: 1  . Years of Education: 12   Occupational History  . homemaker    Social History Main Topics  . Smoking status: Former Smoker -- 1.00 packs/day    Types: Cigarettes    Quit date: 07/07/1987  . Smokeless tobacco: Never Used  . Alcohol Use: 0.6 oz/week    1 Glasses of wine per week     Comment: socially  . Drug Use: No  . Sexual Activity: None   Other Topics Concern  . None   Social History Narrative         Married to Lawrence for 51 years. Patient is retired. Patient has a high school education.   Right handed.   Caffeine- tea and coffee daily.    Family History  Problem Relation Age of Onset  . Cancer Neg Hx   . Stroke Mother     in mid 58's

## 2014-09-10 NOTE — Therapy (Signed)
[facility] [address] [place], [place], [address] Phone: [phone]   Fax:  [phone]  Physical Therapy Treatment  Patient Details  Name: Shelly Willis MRN: [id number] Date of Birth: 1940/10/29 Referring Provider:  Avel Peace, MD  Encounter Date: 09/10/2014      PT End of Session - 09/10/14 1723    Visit Number 5   Number of Visits 8   Date for PT Re-Evaluation 09/18/14   Authorization Type [employer] medicare   Authorization - Visit Number 5   Authorization - Number of Visits 10   PT Start Time 1345   PT Stop Time 1443   PT Time Calculation (min) 58 min   Equipment Utilized During Treatment Gait belt   Activity Tolerance Patient tolerated treatment well   Behavior During Therapy [facility] for tasks assessed/performed      Past Medical History  Diagnosis Date  . Cataract   . Hyperlipidemia   . History of neck surgery cervical laminectomy  . Urethral polyp removed AUG 2010  . Stress incontinence, female   . Migraines   . PONV (postoperative nausea and vomiting)   . H/O exercise stress test     done in PCP- office, maybe 15 yrs. ago   . Cancer     right breast  . Aromatase inhibitor use Nov. 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  Planned for "minimum of 5 years"  . Breast cancer 06/16/11    Right Breast     Past Surgical History  Procedure Laterality Date  . Cholecystectomy  1993  . Tubal ligation  1977  . Bladder surgery  2011    growth attached to bladder stem   . Bilateral salpingoophorectomy    . Ruptured disc surgery 1988  1988  . Kidney stones    . Modified mastectomy  12/28/2011    Procedure: MODIFIED MASTECTOMY;  Surgeon: Valarie Merino, MD;  Location: [facility];  Service: General;  Laterality: Right;  Right modified mastectomy/   . Breast surgery  12/01/2011    Right breast lumpectomy  . Needle  core biopsy  11/02/11    Left Breast Needle Core Biopsy, 9'Oclocl, No Malignancy: Biospy Axilla - Mammary Carcinoma, ER/PR Positive, Low Ki67  . Needle core biopsy  06/16/11    Right Breast - Invasive Mammary with Lobular features  . Abdominal hysterectomy  1997    complete    There were no vitals taken for this visit.  Visit Diagnosis:  Altered gait  Poor balance  Bilateral leg weakness  Parkinson's disease, Lewy body      Subjective Assessment - 09/10/14 1417    Symptoms Pt stated compliance with HEP daily, currenty pain free.     Currently in Pain? No/denies          [facility] Adult PT Treatment/Exercise - 09/10/14 1446    Exercises   Exercises Balance;Knee/Hip   Knee/Hip Exercises: Stretches   Gastroc Stretch 3 reps;30 seconds   Gastroc Stretch Limitations Slantboard   Knee/Hip Exercises: Scientist, water quality for [employer] Leg Press NuStep [place] #5, Lv 3 10' SPM goal 60   Knee/Hip Exercises: Standing   Lunge Walking - Round Trips 1/2RT long hall' with cueing for technique   Gait Training 2RT around dept working on increaseing stride length "thinking big   Balance Exercises   Sidestepping Limitations;1 rep   Theraband  Level (Sidestepping) Level 2 (Red)   Sidestepping Limitations long hallway with UE movments   Tandem Walking 1 round trip   Tandem Walking Limitations long hallway   Retro Gait 2 reps   March on Foam/Wedge Limitations   March on Foam/Wedge Limitations with alternating UE flexion 2x 60 feet; multimodal for proper sequencing   Heel Raises 20 reps   Heel Raises Limitations no HHA   Toe Raise 20 reps   Toe Raise Limitations intermittent HHA                  PT Short Term Goals - 09/10/14 1738    PT SHORT TERM GOAL #1   Title I in HEP   PT SHORT TERM GOAL #2   Title Pt to be able to SLS for 10 seconds B in order to reduce risk of falling   Status On-going   PT SHORT TERM GOAL #3   Title Pt to be able to complete 10 sit to stand in 30  seconds to demonstrate improved power.    Status On-going           PT Long Term Goals - 09/10/14 1738    PT LONG TERM GOAL #1   Title Pt to be I in advance HEP   PT LONG TERM GOAL #2   Title Pt to be able to SLS x 15 seconds B in order to reduce risk of falling    PT LONG TERM GOAL #3   Title Pt to be able to stand for 30 minutes without fatigue    PT LONG TERM GOAL #4   Title Pt to be walking for her health at least ten minutes a day at least 4 days a week    PT LONG TERM GOAL #5   Title Pt to demonstrate improved gait with increased dorsiflexion and increased stride length.                Plan - 09/10/14 1733    Clinical Impression Statement Pt continues to have difficulty with UE and LE coordination tasks requiring max tactile and verbal cueing affect gait mechanics.  Verbal cueing to increase stide length and posture.  Cueing "not to shuffle" most beneficial cueing over session.  Pt limited by fatigue, no reports of pain through session.     PT Next Visit Plan Continue exercises, working on increasing step length forward and backward and decreasing assistance with reciprocal movements.   Progress with agility ladder with coordinating UE movments.         Problem List Patient Active Problem List   Diagnosis Date Noted  . 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 Moderate dementia without behavioral disturbance 07/23/2014  . Breast cancer of upper-outer quadrant of right female breast 06/05/2013  . Cognitive decline 03/20/2013  . Parkinsonism 03/20/2013  . 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 Right mastectomy April 2013 12/09/2011   Ihor Austin, [place]   Aldona Lento 09/10/2014, 5:40 PM  [facility] [address] [place], [place], [address] Phone: [phone]   Fax:  [phone]

## 2014-09-12 ENCOUNTER — Ambulatory Visit (HOSPITAL_COMMUNITY): Payer: Medicare Other | Admitting: Physical Therapy

## 2014-09-17 ENCOUNTER — Telehealth (HOSPITAL_COMMUNITY): Payer: Self-pay | Admitting: Physical Therapy

## 2014-09-17 ENCOUNTER — Ambulatory Visit (HOSPITAL_COMMUNITY): Payer: Medicare Other | Admitting: Physical Therapy

## 2014-09-17 NOTE — Telephone Encounter (Signed)
She doesn't want to get out in the cold

## 2014-09-19 ENCOUNTER — Ambulatory Visit (HOSPITAL_COMMUNITY): Payer: Medicare Other | Admitting: Physical Therapy

## 2014-09-24 ENCOUNTER — Ambulatory Visit (HOSPITAL_COMMUNITY): Payer: Medicare Other | Admitting: Physical Therapy

## 2014-09-24 ENCOUNTER — Telehealth (HOSPITAL_COMMUNITY): Payer: Self-pay | Admitting: Physical Therapy

## 2014-09-24 NOTE — Telephone Encounter (Signed)
Husband called says his wife doesn't want to continue PT, it doesn't seem to be working for her. Requested to be discharged

## 2014-09-25 ENCOUNTER — Other Ambulatory Visit: Payer: Self-pay

## 2014-09-25 ENCOUNTER — Inpatient Hospital Stay: Admission: RE | Admit: 2014-09-25 | Payer: Self-pay | Source: Ambulatory Visit

## 2014-09-26 ENCOUNTER — Ambulatory Visit (HOSPITAL_COMMUNITY): Payer: Medicare Other | Admitting: Physical Therapy

## 2014-09-26 DIAGNOSIS — E538 Deficiency of other specified B group vitamins: Secondary | ICD-10-CM | POA: Diagnosis not present

## 2014-10-12 ENCOUNTER — Encounter (HOSPITAL_COMMUNITY): Payer: Self-pay | Admitting: *Deleted

## 2014-10-12 ENCOUNTER — Inpatient Hospital Stay (HOSPITAL_COMMUNITY)
Admission: EM | Admit: 2014-10-12 | Discharge: 2014-10-16 | DRG: 689 | Disposition: A | Discharge status: Skilled Nursing Facility | Payer: Medicare Other | Attending: Pulmonary Disease | Admitting: Pulmonary Disease

## 2014-10-12 ENCOUNTER — Emergency Department (HOSPITAL_COMMUNITY): Payer: Medicare Other

## 2014-10-12 DIAGNOSIS — Z87891 Personal history of nicotine dependence: Secondary | ICD-10-CM

## 2014-10-12 DIAGNOSIS — F09 Unspecified mental disorder due to known physiological condition: Secondary | ICD-10-CM | POA: Diagnosis not present

## 2014-10-12 DIAGNOSIS — E43 Unspecified severe protein-calorie malnutrition: Secondary | ICD-10-CM | POA: Diagnosis not present

## 2014-10-12 DIAGNOSIS — E785 Hyperlipidemia, unspecified: Secondary | ICD-10-CM | POA: Diagnosis not present

## 2014-10-12 DIAGNOSIS — N390 Urinary tract infection, site not specified: Principal | ICD-10-CM | POA: Diagnosis present

## 2014-10-12 DIAGNOSIS — B9689 Other specified bacterial agents as the cause of diseases classified elsewhere: Secondary | ICD-10-CM | POA: Diagnosis not present

## 2014-10-12 DIAGNOSIS — Z87442 Personal history of urinary calculi: Secondary | ICD-10-CM

## 2014-10-12 DIAGNOSIS — N3000 Acute cystitis without hematuria: Secondary | ICD-10-CM

## 2014-10-12 DIAGNOSIS — E876 Hypokalemia: Secondary | ICD-10-CM | POA: Diagnosis not present

## 2014-10-12 DIAGNOSIS — I10 Essential (primary) hypertension: Secondary | ICD-10-CM | POA: Diagnosis not present

## 2014-10-12 DIAGNOSIS — Z6821 Body mass index (BMI) 21.0-21.9, adult: Secondary | ICD-10-CM

## 2014-10-12 DIAGNOSIS — R32 Unspecified urinary incontinence: Secondary | ICD-10-CM | POA: Diagnosis not present

## 2014-10-12 DIAGNOSIS — F03C0 Unspecified dementia, severe, without behavioral disturbance, psychotic disturbance, mood disturbance, and anxiety: Secondary | ICD-10-CM | POA: Diagnosis present

## 2014-10-12 DIAGNOSIS — Z823 Family history of stroke: Secondary | ICD-10-CM

## 2014-10-12 DIAGNOSIS — Z66 Do not resuscitate: Secondary | ICD-10-CM | POA: Diagnosis present

## 2014-10-12 DIAGNOSIS — Z9011 Acquired absence of right breast and nipple: Secondary | ICD-10-CM | POA: Diagnosis not present

## 2014-10-12 DIAGNOSIS — B9620 Unspecified Escherichia coli [E. coli] as the cause of diseases classified elsewhere: Secondary | ICD-10-CM | POA: Diagnosis present

## 2014-10-12 DIAGNOSIS — G20 Parkinson's disease: Secondary | ICD-10-CM | POA: Diagnosis not present

## 2014-10-12 DIAGNOSIS — R030 Elevated blood-pressure reading, without diagnosis of hypertension: Secondary | ICD-10-CM | POA: Diagnosis not present

## 2014-10-12 DIAGNOSIS — R4189 Other symptoms and signs involving cognitive functions and awareness: Secondary | ICD-10-CM | POA: Diagnosis present

## 2014-10-12 DIAGNOSIS — R531 Weakness: Secondary | ICD-10-CM

## 2014-10-12 DIAGNOSIS — F0390 Unspecified dementia without behavioral disturbance: Secondary | ICD-10-CM | POA: Diagnosis present

## 2014-10-12 DIAGNOSIS — C50411 Malignant neoplasm of upper-outer quadrant of right female breast: Secondary | ICD-10-CM | POA: Diagnosis not present

## 2014-10-12 DIAGNOSIS — I739 Peripheral vascular disease, unspecified: Secondary | ICD-10-CM | POA: Diagnosis not present

## 2014-10-12 NOTE — ED Provider Notes (Addendum)
CSN: [id number]     Arrival date & time 10/12/14  2247 History  This chart was scribed for Wynetta Fines, MD by Stephania Fragmin, ED Scribe. This patient was seen in room APA08/APA08 and the patient's care was started at 11:13 PM.    Chief Complaint  Patient presents with  . Weakness   The history is provided by the spouse. No language interpreter was used.     HPI Comments: Level 5 Caveat: dementia Shelly Willis is a 74 y.o. female with a history of dementia who presents to the Emergency Department with worsening weakness, ongoing since 2 weeks ago; she has been lying in bed most of the day. In the past 2 days she has had reduced ability to ambulate and has nearly fallen three times, though she was caught by her husband each time. Patient has been eating less than usual. She has a history of UTI, but hasn't complained of any UTI-like symptoms. Husband denies unilateral weakness, fever, vomiting, pain or diarrhea.    Past Medical History  Diagnosis Date  . Cataract   . Hyperlipidemia   . History of neck surgery cervical laminectomy  . Urethral polyp removed AUG 2010  . Stress incontinence, female   . Migraines   . PONV (postoperative nausea and vomiting)   . H/O exercise stress test     done in PCP- office, maybe 15 yrs. ago   . Cancer     right breast  . Aromatase inhibitor use Nov. 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  Planned for "minimum of 5 years"  . Breast cancer 06/16/11    Right Breast   . Generalized headaches    Past Surgical History  Procedure Laterality Date  . Cholecystectomy  1993  . Tubal ligation  1977  . Bladder surgery  2011    growth attached to bladder stem   . Bilateral salpingoophorectomy    . Ruptured disc surgery 1988  1988  . Kidney stones    . Modified mastectomy  12/28/2011    Procedure: MODIFIED MASTECTOMY;  Surgeon: Pedro Earls, MD;  Location: [facility];  Service: General;  Laterality: Right;  Right modified mastectomy/   . Breast surgery  12/01/2011    Right breast lumpectomy  . Needle core biopsy  11/02/11    Left Breast Needle Core Biopsy, 9'Oclocl, No Malignancy: Biospy Axilla - Mammary Carcinoma, ER/PR Positive, Low Ki67  . Needle core biopsy  06/16/11    Right Breast - Invasive Mammary with Lobular features  . Abdominal hysterectomy  1997    complete   Family History  Problem Relation Age of Onset  . Cancer Neg Hx   . Stroke Mother     in mid 34's   History  Substance Use Topics  . Smoking status: Former Smoker -- 1.00 packs/day    Types: Cigarettes    Quit date: 07/07/1987  . Smokeless tobacco: Never Used  . Alcohol Use: 0.6 oz/week    1 Glasses of wine per week     Comment: socially   OB History    Gravida Para Term Preterm AB TAB SAB Ectopic Multiple Living   3 3              Obstetric Comments   Menarche age 28, Parity age 12, Gx, T28, Hysterectomy age 30, HRt ~ 87 years     Review  of Systems  Unable to perform ROS: Dementia    Allergies  Vesicare  Home Medications   Prior to Admission medications   Medication Sig Start Date End Date Taking? Authorizing Provider  calcium-vitamin D (OSCAL WITH D) 500-200 MG-UNIT per tablet Take 1 tablet by mouth daily with breakfast.   Yes Historical Provider, MD  cyanocobalamin (,VITAMIN B-12,) 1000 MCG/ML injection Inject 1,000 mcg into the muscle every 30 (thirty) days.    Yes Historical Provider, MD  donepezil (ARICEPT) 5 MG tablet Take 1 tablet (5 mg total) by mouth at bedtime. 07/23/14  Yes Penni Bombard, MD  letrozole (FEMARA) 2.5 MG tablet Take 1 tablet (2.5 mg total) by mouth daily. 05/07/14  Yes Chauncey Cruel, MD  losartan (COZAAR) 100 MG tablet Take 100 mg by mouth every morning.    Yes Historical Provider, MD  phenazopyridine (PYRIDIUM) 200 MG tablet Take 200 mg by mouth daily as needed for pain.   Yes Historical Provider, MD   phenazopyridine (PYRIDIUM) 200 MG tablet 1 tablet 4 times a day Patient not taking: Reported on 10/12/2014 07/04/14   Florian Buff, MD  phenazopyridine (PYRIDIUM) 200 MG tablet Take 1 tablet (200 mg total) by mouth 3 (three) times daily as needed for pain. Patient not taking: Reported on 10/12/2014 07/30/14   Florian Buff, MD   BP 147/95 mmHg  Pulse 97  Temp(Src) 98.8 F (37.1 C) (Rectal)  SpO2 99%   Physical Exam  Nursing note and vitals reviewed. General: Well-developed, well-nourished female in no acute distress; appearance consistent with age of record HENT: normocephalic; atraumatic Eyes: pupils equal, round and reactive to light; lens implants are present Neck: supple Heart: regular rate and rhythm; no murmurs, rubs or gallops Lungs: clear to auscultation bilaterally Abdomen: soft; nondistended; nontender; no masses or hepatosplenomegaly; bowel sounds present Extremities: No deformity; full range of motion; pulses normal Neurologic: Awake, alert; incomprehensible speech; motor function intact in all extremities and symmetric; no facial droop Skin: Warm and dry Psychiatric: Normal mood and affect   ED Course  Procedures (including critical care time)  DIAGNOSTIC STUDIES: Oxygen Saturation is 99% on room air, normal by my interpretation.    COORDINATION OF CARE: 11:19 PM - Discussed treatment plan with pt's husband at bedside which includes diagnostic tests, and pt's husband agreed to plan.    MDM   Nursing notes and vitals signs, including pulse oximetry, reviewed.  Summary of this visit's results, reviewed by myself:   EKG Interpretation  Date/Time:  Sunday October 13 2014 01:57:02 EST Ventricular Rate:  96 PR Interval:  123 QRS Duration: 78 QT Interval:  357 QTC Calculation: 451 R Axis:   52 Text Interpretation:  Sinus rhythm Borderline T wave abnormalities Otherwise no significant change Confirmed by Florina Ou  MD, Jenny Reichmann ([id number]) on 10/13/2014 2:01:36 AM       Labs:  Results for orders placed or performed during the hospital encounter of 10/12/14 (from the past 24 hour(s))  CBC with Differential/Platelet     Status: Abnormal   Collection Time: 10/12/14 11:56 PM  Result Value Ref Range   WBC 12.1 (H) 4.0 - 10.5 K/uL   RBC 4.38 3.87 - 5.11 MIL/uL   Hemoglobin 13.5 12.0 - 15.0 g/dL   HCT 40.8 36.0 - 46.0 %   MCV 93.2 78.0 - 100.0 fL   MCH 30.8 26.0 - 34.0 pg   MCHC 33.1 30.0 - 36.0 g/dL   RDW 12.8 11.5 - 15.5 %   Platelets 406 (  H) 150 - 400 K/uL   Neutrophils Relative % 80 (H) 43 - 77 %   Neutro Abs 9.6 (H) 1.7 - 7.7 K/uL   Lymphocytes Relative 13 12 - 46 %   Lymphs Abs 1.6 0.7 - 4.0 K/uL   Monocytes Relative 7 3 - 12 %   Monocytes Absolute 0.9 0.1 - 1.0 K/uL   Eosinophils Relative 0 0 - 5 %   Eosinophils Absolute 0.0 0.0 - 0.7 K/uL   Basophils Relative 0 0 - 1 %   Basophils Absolute 0.0 0.0 - 0.1 K/uL  Basic metabolic panel     Status: Abnormal   Collection Time: 10/12/14 11:56 PM  Result Value Ref Range   Sodium 137 135 - 145 mmol/L   Potassium 2.7 (LL) 3.5 - 5.1 mmol/L   Chloride 96 96 - 112 mmol/L   CO2 28 19 - 32 mmol/L   Glucose, Bld 136 (H) 70 - 99 mg/dL   BUN 10 6 - 23 mg/dL   Creatinine, Ser 1.09 0.50 - 1.10 mg/dL   Calcium 10.1 8.4 - 10.5 mg/dL   GFR calc non Af Amer 49 (L) >90 mL/min   GFR calc Af Amer 57 (L) >90 mL/min   Anion gap 13 5 - 15  Troponin I     Status: None   Collection Time: 10/12/14 11:56 PM  Result Value Ref Range   Troponin I <0.03 <0.031 ng/mL  Urinalysis, Routine w reflex microscopic     Status: Abnormal   Collection Time: 10/13/14  1:14 AM  Result Value Ref Range   Color, Urine YELLOW YELLOW   APPearance CLOUDY (A) CLEAR   Specific Gravity, Urine 1.025 1.005 - 1.030   pH 7.5 5.0 - 8.0   Glucose, UA NEGATIVE NEGATIVE mg/dL   Hgb urine dipstick LARGE (A) NEGATIVE   Bilirubin Urine SMALL (A) NEGATIVE   Ketones, ur >80 (A) NEGATIVE mg/dL   Protein, ur 100 (A) NEGATIVE mg/dL   Urobilinogen,  UA 0.2 0.0 - 1.0 mg/dL   Nitrite NEGATIVE NEGATIVE   Leukocytes, UA MODERATE (A) NEGATIVE  Urine microscopic-add on     Status: Abnormal   Collection Time: 10/13/14  1:14 AM  Result Value Ref Range   Squamous Epithelial / LPF RARE RARE   WBC, UA TOO NUMEROUS TO COUNT <3 WBC/hpf   RBC / HPF 3-6 <3 RBC/hpf   Bacteria, UA MANY (A) RARE    Imaging Studies: Ct Head Wo Contrast  10/13/2014   CLINICAL DATA:  Increasing weakness. Worsening over the past 2 weeks.  EXAM: CT HEAD WITHOUT CONTRAST  TECHNIQUE: Contiguous axial images were obtained from the base of the skull through the vertex without intravenous contrast.  COMPARISON:  MRI 04/05/2013  FINDINGS: There is no intracranial hemorrhage, mass or evidence of acute infarction. 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 There is severe atrophy which is probably mildly worsened from 04/05/2013. There is severe periventricular hypodensity consistent with chronic microvascular ischemic disease.  No significant bony abnormalities are evident. Paranasal sinuses are clear.  IMPRESSION: Old ischemic changes including lacunar infarctions and severe microvascular ischemic disease. Generalized cerebral volume loss appears to have worsened since 2014. No acute findings are evident.   Electronically Signed   By: Andreas Newport M.D.   On: 10/13/2014 00:39   1:06 AM Potassium infusion ordered for hypokalemia.  2:02 AM Rocephin IV ordered for urinary tract infection. Hospitalist to admit.  I personally performed  the services described in this documentation, which was scribed in my presence. The recorded information has been reviewed and is accurate.    Wynetta Fines, MD 10/13/14 0147  Wynetta Fines, MD 10/13/14 7129

## 2014-10-12 NOTE — ED Notes (Signed)
Pt brought in by [facility] for c/o weakness; pt's husband said that three times today while she was in the bathroom she had an episode of weakness and almost fell to the floor

## 2014-10-13 ENCOUNTER — Encounter (HOSPITAL_COMMUNITY): Payer: Self-pay | Admitting: *Deleted

## 2014-10-13 ENCOUNTER — Encounter (HOSPITAL_COMMUNITY): Payer: Medicare Other

## 2014-10-13 DIAGNOSIS — R531 Weakness: Secondary | ICD-10-CM | POA: Diagnosis present

## 2014-10-13 DIAGNOSIS — E785 Hyperlipidemia, unspecified: Secondary | ICD-10-CM | POA: Diagnosis present

## 2014-10-13 DIAGNOSIS — G20 Parkinson's disease: Secondary | ICD-10-CM | POA: Diagnosis not present

## 2014-10-13 DIAGNOSIS — E876 Hypokalemia: Secondary | ICD-10-CM | POA: Diagnosis not present

## 2014-10-13 DIAGNOSIS — N390 Urinary tract infection, site not specified: Secondary | ICD-10-CM | POA: Diagnosis not present

## 2014-10-13 DIAGNOSIS — N3289 Other specified disorders of bladder: Secondary | ICD-10-CM | POA: Diagnosis not present

## 2014-10-13 DIAGNOSIS — R4189 Other symptoms and signs involving cognitive functions and awareness: Secondary | ICD-10-CM

## 2014-10-13 DIAGNOSIS — G3184 Mild cognitive impairment, so stated: Secondary | ICD-10-CM | POA: Diagnosis not present

## 2014-10-13 DIAGNOSIS — F0390 Unspecified dementia without behavioral disturbance: Secondary | ICD-10-CM

## 2014-10-13 DIAGNOSIS — E43 Unspecified severe protein-calorie malnutrition: Secondary | ICD-10-CM | POA: Diagnosis present

## 2014-10-13 DIAGNOSIS — Z9221 Personal history of antineoplastic chemotherapy: Secondary | ICD-10-CM | POA: Diagnosis not present

## 2014-10-13 DIAGNOSIS — C50411 Malignant neoplasm of upper-outer quadrant of right female breast: Secondary | ICD-10-CM | POA: Diagnosis not present

## 2014-10-13 DIAGNOSIS — R419 Unspecified symptoms and signs involving cognitive functions and awareness: Secondary | ICD-10-CM | POA: Diagnosis not present

## 2014-10-13 DIAGNOSIS — Z823 Family history of stroke: Secondary | ICD-10-CM | POA: Diagnosis not present

## 2014-10-13 DIAGNOSIS — N2889 Other specified disorders of kidney and ureter: Secondary | ICD-10-CM | POA: Diagnosis not present

## 2014-10-13 DIAGNOSIS — I10 Essential (primary) hypertension: Secondary | ICD-10-CM | POA: Diagnosis not present

## 2014-10-13 DIAGNOSIS — Z6821 Body mass index (BMI) 21.0-21.9, adult: Secondary | ICD-10-CM | POA: Diagnosis not present

## 2014-10-13 DIAGNOSIS — R488 Other symbolic dysfunctions: Secondary | ICD-10-CM | POA: Diagnosis not present

## 2014-10-13 DIAGNOSIS — B9620 Unspecified Escherichia coli [E. coli] as the cause of diseases classified elsewhere: Secondary | ICD-10-CM | POA: Diagnosis present

## 2014-10-13 DIAGNOSIS — Z9011 Acquired absence of right breast and nipple: Secondary | ICD-10-CM | POA: Diagnosis present

## 2014-10-13 DIAGNOSIS — R262 Difficulty in walking, not elsewhere classified: Secondary | ICD-10-CM | POA: Diagnosis not present

## 2014-10-13 DIAGNOSIS — R32 Unspecified urinary incontinence: Secondary | ICD-10-CM | POA: Diagnosis present

## 2014-10-13 DIAGNOSIS — Z66 Do not resuscitate: Secondary | ICD-10-CM | POA: Diagnosis present

## 2014-10-13 DIAGNOSIS — R278 Other lack of coordination: Secondary | ICD-10-CM | POA: Diagnosis not present

## 2014-10-13 DIAGNOSIS — M6281 Muscle weakness (generalized): Secondary | ICD-10-CM | POA: Diagnosis not present

## 2014-10-13 DIAGNOSIS — F09 Unspecified mental disorder due to known physiological condition: Secondary | ICD-10-CM | POA: Diagnosis present

## 2014-10-13 DIAGNOSIS — N3941 Urge incontinence: Secondary | ICD-10-CM | POA: Diagnosis not present

## 2014-10-13 DIAGNOSIS — E782 Mixed hyperlipidemia: Secondary | ICD-10-CM | POA: Diagnosis not present

## 2014-10-13 DIAGNOSIS — I739 Peripheral vascular disease, unspecified: Secondary | ICD-10-CM | POA: Diagnosis not present

## 2014-10-13 DIAGNOSIS — Z87442 Personal history of urinary calculi: Secondary | ICD-10-CM | POA: Diagnosis not present

## 2014-10-13 DIAGNOSIS — Z87891 Personal history of nicotine dependence: Secondary | ICD-10-CM | POA: Diagnosis not present

## 2014-10-13 DIAGNOSIS — Z9181 History of falling: Secondary | ICD-10-CM | POA: Diagnosis not present

## 2014-10-13 LAB — URINE MICROSCOPIC-ADD ON

## 2014-10-13 LAB — CBC WITH DIFFERENTIAL/PLATELET
Basophils Absolute: 0 10*3/uL (ref 0.0–0.1)
Basophils Relative: 0 % (ref 0–1)
EOS ABS: 0 10*3/uL (ref 0.0–0.7)
Eosinophils Relative: 0 % (ref 0–5)
HCT: 40.8 % (ref 36.0–46.0)
Hemoglobin: 13.5 g/dL (ref 12.0–15.0)
Lymphocytes Relative: 13 % (ref 12–46)
Lymphs Abs: 1.6 10*3/uL (ref 0.7–4.0)
MCH: 30.8 pg (ref 26.0–34.0)
MCHC: 33.1 g/dL (ref 30.0–36.0)
MCV: 93.2 fL (ref 78.0–100.0)
Monocytes Absolute: 0.9 10*3/uL (ref 0.1–1.0)
Monocytes Relative: 7 % (ref 3–12)
NEUTROS ABS: 9.6 10*3/uL — AB (ref 1.7–7.7)
Neutrophils Relative %: 80 % — ABNORMAL HIGH (ref 43–77)
Platelets: 406 10*3/uL — ABNORMAL HIGH (ref 150–400)
RBC: 4.38 MIL/uL (ref 3.87–5.11)
RDW: 12.8 % (ref 11.5–15.5)
WBC: 12.1 10*3/uL — AB (ref 4.0–10.5)

## 2014-10-13 LAB — BASIC METABOLIC PANEL
Anion gap: 13 (ref 5–15)
BUN: 10 mg/dL (ref 6–23)
CALCIUM: 10.1 mg/dL (ref 8.4–10.5)
CHLORIDE: 96 mmol/L (ref 96–112)
CO2: 28 mmol/L (ref 19–32)
Creatinine, Ser: 1.09 mg/dL (ref 0.50–1.10)
GFR calc Af Amer: 57 mL/min — ABNORMAL LOW (ref >90)
GFR calc non Af Amer: 49 mL/min — ABNORMAL LOW (ref >90)
Glucose, Bld: 136 mg/dL — ABNORMAL HIGH (ref 70–99)
POTASSIUM: 2.7 mmol/L — AB (ref 3.5–5.1)
Sodium: 137 mmol/L (ref 135–145)

## 2014-10-13 LAB — URINALYSIS, ROUTINE W REFLEX MICROSCOPIC
Glucose, UA: NEGATIVE mg/dL
NITRITE: NEGATIVE
PH: 7.5 (ref 5.0–8.0)
Protein, ur: 100 mg/dL — AB
Specific Gravity, Urine: 1.025 (ref 1.005–1.030)
Urobilinogen, UA: 0.2 mg/dL (ref 0.0–1.0)

## 2014-10-13 LAB — TROPONIN I: Troponin I: <0.03 ng/mL (ref <0.031)

## 2014-10-13 MED ORDER — SODIUM CHLORIDE 0.9 % IJ SOLN
10.0000 mL | Freq: Two times a day (BID) | INTRAMUSCULAR | Status: DC
  Administered 2014-10-13 (×2): 10 mL
  Administered 2014-10-14: 20 mL
  Administered 2014-10-15: 10 mL

## 2014-10-13 MED ORDER — POTASSIUM CHLORIDE 20 MEQ/15ML (10%) PO SOLN
40.0000 meq | Freq: Once | ORAL | Status: AC
  Administered 2014-10-13: 40 meq via ORAL
  Filled 2014-10-13: qty 30

## 2014-10-13 MED ORDER — POTASSIUM CHLORIDE 10 MEQ/100ML IV SOLN
10.0000 meq | INTRAVENOUS | Status: AC
  Filled 2014-10-13: qty 100

## 2014-10-13 MED ORDER — DEXTROSE 5 % IV SOLN: 1.0000 g | Freq: Once | INTRAVENOUS | Status: DC

## 2014-10-13 MED ORDER — ONDANSETRON HCL 4 MG/2ML IJ SOLN: 4.0000 mg | Freq: Four times a day (QID) | INTRAMUSCULAR | Status: DC | PRN

## 2014-10-13 MED ORDER — ENOXAPARIN SODIUM 40 MG/0.4ML ~~LOC~~ SOLN
40.0000 mg | SUBCUTANEOUS | Status: DC
  Administered 2014-10-13 – 2014-10-16 (×4): 40 mg via SUBCUTANEOUS
  Filled 2014-10-13 (×4): qty 0.4

## 2014-10-13 MED ORDER — LETROZOLE 2.5 MG PO TABS
2.5000 mg | ORAL_TABLET | Freq: Every day | ORAL | Status: DC
  Administered 2014-10-13 – 2014-10-16 (×4): 2.5 mg via ORAL
  Filled 2014-10-13 (×6): qty 1

## 2014-10-13 MED ORDER — CEPHALEXIN 500 MG PO CAPS
500.0000 mg | ORAL_CAPSULE | Freq: Two times a day (BID) | ORAL | Status: DC
  Administered 2014-10-13: 500 mg via ORAL
  Filled 2014-10-13: qty 1

## 2014-10-13 MED ORDER — SODIUM CHLORIDE 0.9 % IJ SOLN
10.0000 mL | INTRAMUSCULAR | Status: DC | PRN
Start: 2014-10-13 — End: 2014-10-16
  Administered 2014-10-13: 20 mL
  Filled 2014-10-13: qty 40

## 2014-10-13 MED ORDER — ACETAMINOPHEN 325 MG PO TABS: 650.0000 mg | ORAL_TABLET | Freq: Four times a day (QID) | ORAL | Status: DC | PRN

## 2014-10-13 MED ORDER — ONDANSETRON HCL 4 MG PO TABS: 4.0000 mg | ORAL_TABLET | Freq: Four times a day (QID) | ORAL | Status: DC | PRN

## 2014-10-13 MED ORDER — SODIUM CHLORIDE 0.9 % IV SOLN
INTRAVENOUS | Status: DC
  Administered 2014-10-13 – 2014-10-15 (×5): via INTRAVENOUS

## 2014-10-13 MED ORDER — ACETAMINOPHEN 650 MG RE SUPP: 650.0000 mg | Freq: Four times a day (QID) | RECTAL | Status: DC | PRN

## 2014-10-13 MED ORDER — LOSARTAN POTASSIUM 50 MG PO TABS
100.0000 mg | ORAL_TABLET | Freq: Every morning | ORAL | Status: DC
  Administered 2014-10-13 – 2014-10-16 (×4): 100 mg via ORAL
  Filled 2014-10-13 (×4): qty 2

## 2014-10-13 MED ORDER — CEFTRIAXONE SODIUM IN DEXTROSE 20 MG/ML IV SOLN: 1.0000 g | INTRAVENOUS | Status: DC

## 2014-10-13 MED ORDER — DONEPEZIL HCL 5 MG PO TABS
5.0000 mg | ORAL_TABLET | Freq: Every day | ORAL | Status: DC
  Administered 2014-10-13 – 2014-10-15 (×3): 5 mg via ORAL
  Filled 2014-10-13 (×3): qty 1

## 2014-10-13 MED ORDER — CEPHALEXIN 500 MG PO CAPS: 500.0000 mg | ORAL_CAPSULE | Freq: Two times a day (BID) | ORAL | Status: DC

## 2014-10-13 MED ORDER — CEFTRIAXONE SODIUM IN DEXTROSE 20 MG/ML IV SOLN
1.0000 g | INTRAVENOUS | Status: DC
  Administered 2014-10-13 – 2014-10-16 (×4): 1 g via INTRAVENOUS
  Filled 2014-10-13 (×5): qty 50

## 2014-10-13 MED ORDER — ENSURE COMPLETE PO LIQD
237.0000 mL | Freq: Two times a day (BID) | ORAL | Status: DC
  Administered 2014-10-13 – 2014-10-16 (×7): 237 mL via ORAL

## 2014-10-13 NOTE — Progress Notes (Signed)
Subjective: She was admitted early this morning with urinary tract infection and hypokalemia. She is apparently been having more confusion and lethargy for 2-3 days.  Objective: Vital signs in last 24 hours: Temp:  [98.8 F (37.1 C)] 98.8 F (37.1 C) (02/14 0402) Pulse Rate:  [95-97] 95 (02/14 0402) BP: (147)/(95) 147/95 mmHg (02/13 2252) SpO2:  [98 %-99 %] 98 % (02/14 0402) Weight:  [58.605 kg (129 lb 3.2 oz)] 58.605 kg (129 lb 3.2 oz) (02/14 0402) Weight change:  Last BM Date: 10/12/14  Intake/Output from previous day:    PHYSICAL EXAM General appearance: alert and Confused Resp: clear to auscultation bilaterally Cardio: regular rate and rhythm, S1, S2 normal, no murmur, click, rub or gallop GI: soft, non-tender; bowel sounds normal; no masses,  no organomegaly Extremities: extremities normal, atraumatic, no cyanosis or edema  Lab Results:  Results for orders placed or performed during the hospital encounter of 10/12/14 (from the past 48 hour(s))  CBC with Differential/Platelet     Status: Abnormal   Collection Time: 10/12/14 11:56 PM  Result Value Ref Range   WBC 12.1 (H) 4.0 - 10.5 K/uL   RBC 4.38 3.87 - 5.11 MIL/uL   Hemoglobin 13.5 12.0 - 15.0 g/dL   HCT 40.8 36.0 - 46.0 %   MCV 93.2 78.0 - 100.0 fL   MCH 30.8 26.0 - 34.0 pg   MCHC 33.1 30.0 - 36.0 g/dL   RDW 12.8 11.5 - 15.5 %   Platelets 406 (H) 150 - 400 K/uL   Neutrophils Relative % 80 (H) 43 - 77 %   Neutro Abs 9.6 (H) 1.7 - 7.7 K/uL   Lymphocytes Relative 13 12 - 46 %   Lymphs Abs 1.6 0.7 - 4.0 K/uL   Monocytes Relative 7 3 - 12 %   Monocytes Absolute 0.9 0.1 - 1.0 K/uL   Eosinophils Relative 0 0 - 5 %   Eosinophils Absolute 0.0 0.0 - 0.7 K/uL   Basophils Relative 0 0 - 1 %   Basophils Absolute 0.0 0.0 - 0.1 K/uL  Basic metabolic panel     Status: Abnormal   Collection Time: 10/12/14 11:56 PM  Result Value Ref Range   Sodium 137 135 - 145 mmol/L   Potassium 2.7 (LL) 3.5 - 5.1 mmol/L    Comment:  RESULT REPEATED AND VERIFIED CRITICAL RESULT CALLED TO, READ BACK BY AND VERIFIED WITH:  TALBOTT,T @ 0105 ON 10/13/14/ BY WOODIE,J    Chloride 96 96 - 112 mmol/L   CO2 28 19 - 32 mmol/L   Glucose, Bld 136 (H) 70 - 99 mg/dL   BUN 10 6 - 23 mg/dL   Creatinine, Ser 1.09 0.50 - 1.10 mg/dL   Calcium 10.1 8.4 - 10.5 mg/dL   GFR calc non Af Amer 49 (L) >90 mL/min   GFR calc Af Amer 57 (L) >90 mL/min    Comment: (NOTE) The eGFR has been calculated using the CKD EPI equation. This calculation has not been validated in all clinical situations. eGFR's persistently <90 mL/min signify possible Chronic Kidney Disease.    Anion gap 13 5 - 15  Troponin I     Status: None   Collection Time: 10/12/14 11:56 PM  Result Value Ref Range   Troponin I <0.03 <0.031 ng/mL    Comment:        NO INDICATION OF MYOCARDIAL INJURY.   Urinalysis, Routine w reflex microscopic     Status: Abnormal   Collection Time: 10/13/14  1:14 AM  Result Value Ref Range   Color, Urine YELLOW YELLOW   APPearance CLOUDY (A) CLEAR   Specific Gravity, Urine 1.025 1.005 - 1.030   pH 7.5 5.0 - 8.0   Glucose, UA NEGATIVE NEGATIVE mg/dL   Hgb urine dipstick LARGE (A) NEGATIVE   Bilirubin Urine SMALL (A) NEGATIVE   Ketones, ur >80 (A) NEGATIVE mg/dL   Protein, ur 100 (A) NEGATIVE mg/dL   Urobilinogen, UA 0.2 0.0 - 1.0 mg/dL   Nitrite NEGATIVE NEGATIVE   Leukocytes, UA MODERATE (A) NEGATIVE  Urine microscopic-add on     Status: Abnormal   Collection Time: 10/13/14  1:14 AM  Result Value Ref Range   Squamous Epithelial / LPF RARE RARE   WBC, UA TOO NUMEROUS TO COUNT <3 WBC/hpf   RBC / HPF 3-6 <3 RBC/hpf   Bacteria, UA MANY (A) RARE    ABGS No results for input(s): PHART, PO2ART, TCO2, HCO3 in the last 72 hours.  Invalid input(s): PCO2 CULTURES No results found for this or any previous visit (from the past 240 hour(s)). Studies/Results: Ct Head Wo Contrast  10/13/2014   CLINICAL DATA:  Increasing weakness. Worsening  over the past 2 weeks.  EXAM: CT HEAD WITHOUT CONTRAST  TECHNIQUE: Contiguous axial images were obtained from the base of the skull through the vertex without intravenous contrast.  COMPARISON:  MRI 04/05/2013  FINDINGS: There is no intracranial hemorrhage, mass or evidence of acute infarction. 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 There is severe atrophy which is probably mildly worsened from 04/05/2013. There is severe periventricular hypodensity consistent with chronic microvascular ischemic disease.  No significant bony abnormalities are evident. Paranasal sinuses are clear.  IMPRESSION: Old ischemic changes including lacunar infarctions and severe microvascular ischemic disease. Generalized cerebral volume loss appears to have worsened since 2014. No acute findings are evident.   Electronically Signed   By: Andreas Newport M.D.   On: 10/13/2014 00:39    Medications:  Prior to Admission:  Prescriptions prior to admission  Medication Sig Dispense Refill Last Dose  . calcium-vitamin D (OSCAL WITH D) 500-200 MG-UNIT per tablet Take 1 tablet by mouth daily with breakfast.   10/11/2014  . cyanocobalamin (,VITAMIN B-12,) 1000 MCG/ML injection Inject 1,000 mcg into the muscle every 30 (thirty) days.    Past Month  . donepezil (ARICEPT) 5 MG tablet Take 1 tablet (5 mg total) by mouth at bedtime. 30 tablet 0 10/11/2014  . letrozole (FEMARA) 2.5 MG tablet Take 1 tablet (2.5 mg total) by mouth daily. 60 tablet 5 10/11/2014  . losartan (COZAAR) 100 MG tablet Take 100 mg by mouth every morning.    10/11/2014  . phenazopyridine (PYRIDIUM) 200 MG tablet Take 200 mg by mouth daily as needed for pain.   More Than A Month  . phenazopyridine (PYRIDIUM) 200 MG tablet 1 tablet 4 times a day (Patient not taking: Reported on 10/12/2014) 120 tablet 4 Taking  . phenazopyridine (PYRIDIUM) 200 MG tablet Take 1 tablet (200 mg total) by mouth 3 (three) times daily as  needed for pain. (Patient not taking: Reported on 10/12/2014) 90 tablet 11    Scheduled: . cephALEXin  500 mg Oral Q12H  . donepezil  5 mg Oral QHS  . enoxaparin (LOVENOX) injection  40 mg Subcutaneous Q24H  . feeding supplement (ENSURE COMPLETE)  237 mL Oral BID BM  . letrozole  2.5 mg Oral Daily  . losartan  100 mg Oral q  morning - 10a   Continuous: . sodium chloride     ZBF:[id number] **OR** acetaminophen, ondansetron **OR** ondansetron (ZOFRAN) IV  Assesment: She was admitted with urinary tract infection. She has had cognitive decline with dementia. At least some of this has been thought to be related to chemotherapy for breast cancer. She has lost IV access. She has hypokalemia. Principal Problem:   UTI (lower urinary tract infection) Active Problems:   Cognitive decline   Parkinsonism   Breast cancer of upper-outer quadrant of right female breast   Moderate dementia without behavioral disturbance   UTI (urinary tract infection)    Plan: PICC line this morning restart IV antibiotic after this is established    LOS: 0 days   Laketta Soderberg L 10/13/2014, 10:01 AM

## 2014-10-13 NOTE — Progress Notes (Addendum)
RN from ED called telling that patient has lost her IV access, and did not receive either IV Rocephin or IV potassium. All the attempts to replace IV have been unsuccessful. At this time will start Keflex 500 by mouth twice a day, and give 1 dose of KCl 40 meq by mouth 1 Also order for PICC line in a.m.  After the PICC line is inserted, consider restarting IV antibiotics and discontinue Keflex.

## 2014-10-13 NOTE — ED Notes (Signed)
CRITICAL VALUE ALERT  Critical value received:  Potassium 2.7  Date of notification:  10/13/2014  Time of notification:  0102  Critical value read back:Yes.    Nurse who received alert:  Fabio Neighbors RN  MD notified (1st page): Dr. Florina Ou @ [phone]

## 2014-10-13 NOTE — H&P (Signed)
PCP:   Alonza Bogus, MD   Chief Complaint:  Generalized weakness  HPI: 74 year old female who  has a past medical history of Cataract; Hyperlipidemia; History of neck surgery (cervical laminectomy); Urethral polyp (removed AUG 2010); Stress incontinence, female; Migraines; PONV (postoperative nausea and vomiting); H/O exercise stress test; Cancer; Aromatase inhibitor use (Nov. 2012 -Continues); Breast cancer (06/16/11); and Generalized headaches. Today was brought to the ED by patient's husband with chief complaint of worsening generalized weakness ongoing for past 2 weeks. As per the husband patient has been lying in the bed most of the day and for the past 2 days she also had reduced ability to ambulate. Patient has nearly fallen at home 3 times, and she was caught by her husband each time. There is no history of fall with injury as per husband. Today patient again became weak and was about to fall after she had a bowel movement. And husband brought her to the ED for further evaluation. There is no history of recent nausea vomiting or diarrhea. No chest pain or shortness of breath. Patient has a limited verbal communication ability due to underlying dementia so most of the history is obtained from the patient's husband. In the ED patient was found to have the UTI and started on IV Rocephin. The  Allergies:   Allergies  Allergen Reactions  . Vesicare [Solifenacin Succinate] Other (See Comments)    Headaches and upset stomach       Past Medical History  Diagnosis Date  . Cataract   . Hyperlipidemia   . History of neck surgery cervical laminectomy  . Urethral polyp removed AUG 2010  . Stress incontinence, female   . Migraines   . PONV (postoperative nausea and vomiting)   . H/O exercise stress test     done in PCP- office, maybe 15 yrs. ago   . Cancer     right breast  . Aromatase inhibitor use Nov. 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  Planned for "minimum of 5 years"  . Breast cancer 06/16/11    Right Breast   . Generalized headaches     Past Surgical History  Procedure Laterality Date  . Cholecystectomy  1993  . Tubal ligation  1977  . Bladder surgery  2011    growth attached to bladder stem   . Bilateral salpingoophorectomy    . Ruptured disc surgery 1988  1988  . Kidney stones    . Modified mastectomy  12/28/2011    Procedure: MODIFIED MASTECTOMY;  Surgeon: Pedro Earls, MD;  Location: [place];  Service: General;  Laterality: Right;  Right modified mastectomy/   . Breast surgery  12/01/2011    Right breast lumpectomy  . Needle core biopsy  11/02/11    Left Breast Needle Core Biopsy, 9'Oclocl, No Malignancy: Biospy Axilla - Mammary Carcinoma, ER/PR Positive, Low Ki67  . Needle core biopsy  06/16/11    Right Breast - Invasive Mammary with Lobular features  . Abdominal hysterectomy  1997    complete    Prior to Admission medications   Medication Sig Start Date End Date Taking? Authorizing Provider  calcium-vitamin D (OSCAL WITH D) 500-200 MG-UNIT per tablet Take 1 tablet by mouth daily with breakfast.   Yes Historical Provider, MD  cyanocobalamin (,VITAMIN B-12,) 1000 MCG/ML injection Inject 1,000 mcg into the muscle every 30 (thirty) days.    Yes  Historical Provider, MD  donepezil (ARICEPT) 5 MG tablet Take 1 tablet (5 mg total) by mouth at bedtime. 07/23/14  Yes Penni Bombard, MD  letrozole (FEMARA) 2.5 MG tablet Take 1 tablet (2.5 mg total) by mouth daily. 05/07/14  Yes Chauncey Cruel, MD  losartan (COZAAR) 100 MG tablet Take 100 mg by mouth every morning.    Yes Historical Provider, MD  phenazopyridine (PYRIDIUM) 200 MG tablet Take 200 mg by mouth daily as needed for pain.   Yes Historical Provider, MD  phenazopyridine (PYRIDIUM) 200 MG tablet 1 tablet 4 times a day Patient not taking: Reported on 10/12/2014 07/04/14   Florian Buff, MD  phenazopyridine (PYRIDIUM) 200 MG tablet Take 1 tablet (200 mg total) by mouth 3 (three) times daily as needed for pain. Patient not taking: Reported on 10/12/2014 07/30/14   Florian Buff, MD    Social History:  reports that she quit smoking about 27 years ago. Her smoking use included Cigarettes. She smoked 1.00 pack per day. She has never used smokeless tobacco. She reports that she drinks about 0.6 oz of alcohol per week. She reports that she does not use illicit drugs.  Family History  Problem Relation Age of Onset  . Cancer Neg Hx   . Stroke Mother     in mid 31's     All the positives are listed in BOLD  Review of Systems:  HEENT: Headache, blurred vision, runny nose, sore throat Neck: Hypothyroidism, hyperthyroidism,,lymphadenopathy Chest : Shortness of breath, history of COPD, Asthma Heart : Chest pain, history of coronary arterey disease GI:  Nausea, vomiting, diarrhea, constipation, GERD GU: Dysuria, urgency, frequency of urination, hematuria Neuro: Stroke, seizures, syncope, dementia Psych: Depression, anxiety, hallucinations   Physical Exam: Blood pressure 147/95, pulse 97, temperature 98.8 F (37.1 C), temperature source Rectal, SpO2 99 %. Constitutional:   Patient is a well-developed and well-nourished *female in no acute distress and cooperative with exam. Head: Normocephalic and atraumatic Mouth: Mucus membranes moist Eyes: PERRL, EOMI, conjunctivae normal Neck: Supple, No Thyromegaly Cardiovascular: RRR, S1 normal, S2 normal Pulmonary/Chest: CTAB, no wheezes, rales, or rhonchi Abdominal: Soft. Non-tender, non-distended, bowel sounds are normal, no masses, organomegaly, or guarding present.  Neurological: Alert, moving all extremities .  Extremities : No Cyanosis, Clubbing or Edema  Labs on Admission:  Basic Metabolic Panel:  Recent Labs Lab 10/12/14 2356  NA 137  K 2.7*  CL 96  CO2 28  GLUCOSE 136*  BUN 10  CREATININE 1.09  CALCIUM  10.1   Liver Function Tests: No results for input(s): AST, ALT, ALKPHOS, BILITOT, PROT, ALBUMIN in the last 168 hours. No results for input(s): LIPASE, AMYLASE in the last 168 hours. No results for input(s): AMMONIA in the last 168 hours. CBC:  Recent Labs Lab 10/12/14 2356  WBC 12.1*  NEUTROABS 9.6*  HGB 13.5  HCT 40.8  MCV 93.2  PLT 406*   Cardiac Enzymes:  Recent Labs Lab 10/12/14 2356  TROPONINI <0.03     Radiological Exams on Admission: Ct Head Wo Contrast  10/13/2014   CLINICAL DATA:  Increasing weakness. Worsening over the past 2 weeks.  EXAM: CT HEAD WITHOUT CONTRAST  TECHNIQUE: Contiguous axial images were obtained from the base of the skull through the vertex without intravenous contrast.  COMPARISON:  MRI 04/05/2013  FINDINGS: There is no intracranial hemorrhage, mass or evidence of acute infarction. 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 There  is severe atrophy which is probably mildly worsened from 04/05/2013. There is severe periventricular hypodensity consistent with chronic microvascular ischemic disease.  No significant bony abnormalities are evident. Paranasal sinuses are clear.  IMPRESSION: Old ischemic changes including lacunar infarctions and severe microvascular ischemic disease. Generalized cerebral volume loss appears to have worsened since 2014. No acute findings are evident.   Electronically Signed   By: Andreas Newport M.D.   On: 10/13/2014 00:39    EKG: Independently reviewed. Sinus rhythm   Assessment/Plan Principal Problem:   UTI (lower urinary tract infection) Active Problems:   Cognitive decline   Parkinsonism   Breast cancer of upper-outer quadrant of right female breast   Moderate dementia without behavioral disturbance   UTI (urinary tract infection)  UTI Patient has abnormal UA, has been started on IV Rocephin. Will include Rocephin and follow the urine culture  results.  Generalized weakness Likely due to UTI as well as hypokalemia, potassium will be replaced. And antibiotics started as above.  Hypokalemia Patient's potassium was 2.7, will replace potassium with IV KCl 10 mg 4 and check BMP in morning  Dementia without behavior disturbance Patient has history of underlying dementia without any behavior disturbance, continue Aricept.  History of breast cancer Continue Femara 2.5 mg daily  DVT prophylaxis Lovenox  Code status: DO NOT RESUSCITATE  Family discussion: Admission, patients condition and plan of care including tests being ordered have been discussed with patient's husband at bedside* who indicate understanding and agree with the plan and Code Status.   Time Spent on Admission: 55 minutes  [facility] Pager: [phone] 10/13/2014, 2:31 AM  If 7PM-7AM, please contact night-coverage  [email or web address]  Password [email or web address]

## 2014-10-13 NOTE — Progress Notes (Signed)
Peripherally Inserted Central Catheter/Midline Placement  The IV Nurse has discussed with the patient and/or persons authorized to consent for the patient, the purpose of this procedure and the potential benefits and risks involved with this procedure.  The benefits include less needle sticks, lab draws from the catheter and patient may be discharged home with the catheter.  Risks include, but not limited to, infection, bleeding, blood clot (thrombus formation), and puncture of an artery; nerve damage and irregular heat beat.  Alternatives to this procedure were also discussed.  PICC/Midline Placement Documentation  PICC / Midline Single Lumen 83/09/40 PICC Left Basilic 42 cm 0 cm (Active)  Indication for Insertion or Continuance of Line Poor Vasculature-patient has had multiple peripheral attempts or PIVs lasting less than 24 hours;Limited venous access - need for IV therapy >5 days (PICC only) 10/13/2014 11:49 AM  Exposed Catheter (cm) 0 cm 10/13/2014 11:49 AM  Site Assessment Clean;Dry;Intact;Bruised 10/13/2014 11:49 AM  Line Status Flushed;Capped (central line);Blood return noted 10/13/2014 11:49 AM  Dressing Type Transparent;Securing device 10/13/2014 11:49 AM  Dressing Status Clean;Dry;Intact;Antimicrobial disc in place 10/13/2014 11:49 AM  Line Care Connections checked and tightened 10/13/2014 11:49 AM  Line Adjustment (NICU/IV Team Only) No 10/13/2014 11:49 AM  Dressing Intervention New dressing 10/13/2014 11:49 AM  Dressing Change Due 10/20/14 10/13/2014 11:49 AM       Jossie Ng, Essie Hart 10/13/2014, 11:50 AM

## 2014-10-13 NOTE — ED Notes (Signed)
Dr. Lama in with pt at this time 

## 2014-10-14 LAB — BASIC METABOLIC PANEL
ANION GAP: 9 (ref 5–15)
BUN: 7 mg/dL (ref 6–23)
CALCIUM: 9.3 mg/dL (ref 8.4–10.5)
CO2: 30 mmol/L (ref 19–32)
CREATININE: 0.86 mg/dL (ref 0.50–1.10)
Chloride: 103 mmol/L (ref 96–112)
GFR calc Af Amer: 76 mL/min — ABNORMAL LOW (ref >90)
GFR calc non Af Amer: 65 mL/min — ABNORMAL LOW (ref >90)
GLUCOSE: 102 mg/dL — AB (ref 70–99)
Potassium: 2.9 mmol/L — ABNORMAL LOW (ref 3.5–5.1)
SODIUM: 142 mmol/L (ref 135–145)

## 2014-10-14 LAB — CBC WITH DIFFERENTIAL/PLATELET
Basophils Absolute: 0 10*3/uL (ref 0.0–0.1)
Basophils Relative: 0 % (ref 0–1)
Eosinophils Absolute: 0.2 10*3/uL (ref 0.0–0.7)
Eosinophils Relative: 2 % (ref 0–5)
HEMATOCRIT: 39.1 % (ref 36.0–46.0)
Hemoglobin: 12.5 g/dL (ref 12.0–15.0)
LYMPHS ABS: 2.1 10*3/uL (ref 0.7–4.0)
Lymphocytes Relative: 23 % (ref 12–46)
MCH: 30.4 pg (ref 26.0–34.0)
MCHC: 32 g/dL (ref 30.0–36.0)
MCV: 95.1 fL (ref 78.0–100.0)
Monocytes Absolute: 1.2 10*3/uL — ABNORMAL HIGH (ref 0.1–1.0)
Monocytes Relative: 12 % (ref 3–12)
NEUTROS PCT: 63 % (ref 43–77)
Neutro Abs: 6 10*3/uL (ref 1.7–7.7)
Platelets: 366 10*3/uL (ref 150–400)
RBC: 4.11 MIL/uL (ref 3.87–5.11)
RDW: 13 % (ref 11.5–15.5)
WBC: 9.4 10*3/uL (ref 4.0–10.5)

## 2014-10-14 MED ORDER — POTASSIUM CHLORIDE CRYS ER 20 MEQ PO TBCR
40.0000 meq | EXTENDED_RELEASE_TABLET | Freq: Two times a day (BID) | ORAL | Status: DC
  Administered 2014-10-14 – 2014-10-16 (×5): 40 meq via ORAL
  Filled 2014-10-14 (×5): qty 2

## 2014-10-14 NOTE — Progress Notes (Signed)
INITIAL NUTRITION ASSESSMENT  DOCUMENTATION CODES Per approved criteria  -Severe malnutrition in the context of chronic illness   Pt meets criteria of severe MALNUTRITION in the context of chronic illness as evidenced by 7% wt loss in 1.5 months, </=75% energy intake for >/= 1 month.   INTERVENTION: Agree with Ensure Complete po BID, each supplement provides 350 kcal and 13 grams of protein   Send [place] yogurt daily with breakfast  Recommend weigh pt on regular scales if she is able.   NUTRITION DIAGNOSIS: Inadequate oral intake related to chronic disease progression and UTI as evidenced by diet hx and 7% unplanned wt loss and <10% of meals .   Goal: Pt to meet >/= 90% of their estimated nutrition needs     Monitor: Po intake, labs and wt trends    Reason for Assessment: Malnutrition Screen   74 y.o. female  Admitting Dx: UTI (lower urinary tract infection)  ASSESSMENT: Pt presents with UTI. Lives with her husband. She hx of dementia (moderate) with cognitive decline noted. She also has breast cancer and is receiving Chemotherapy. Increased weakness and decrease in food intake for at least the past 2 weeks per spouse. She taking fluids better than solids therefore we'll focus on maximizing her nutrition via supplements for now. Pt likes Ensure (chocolate).   Her meal intake of breakfast and lunch today has been  Very poor (bites of eggs and only bites of lunch) <10% of meals consumed.  Pt has 7% unplanned wt loss which is significant for timeframe.   Nutrition focused exam: mild temporal, clavicle and dorsal wasting.  Labs: potassium is low (being repleted)  Height: Ht Readings from Last 1 Encounters:  10/13/14 $RemoveB'5\' 5"'ToehzsXm$  (1.651 m)    Weight: Wt Readings from Last 1 Encounters:  10/13/14 129 lb 3.2 oz (58.605 kg)  re-weight obtained 138.8#   Ideal Body Weight: 125# (56.8 kg)  Wt Readings from Last 10 Encounters:  10/13/14 129 lb 3.2 oz (58.605 kg)  07/30/14 149 lb  (67.586 kg)  07/23/14 148 lb 9.6 oz (67.405 kg)  07/02/14 150 lb 8 oz (68.266 kg)  06/13/14 156 lb (70.761 kg)  05/23/14 157 lb (71.215 kg)  02/06/14 158 lb 9.6 oz (71.94 kg)  10/22/13 159 lb (72.122 kg)  06/21/13 165 lb (74.844 kg)  06/08/13 167 lb 6.4 oz (75.932 kg)    Usual Body Weight: 150-160#  % Usual Body Weight: 93%  BMI:  Body mass index is 21.5 kg/(m^2). normal range  Estimated Nutritional Needs: Kcal: 9604-5409 Protein: 70-80 gr Fluid: 1.5-1.8 liters daily  Skin: no issues noted  Diet Order: Diet regular  EDUCATION NEEDS: -No education needs identified at this time   Intake/Output Summary (Last 24 hours) at 10/14/14 1018 Last data filed at 10/13/14 1146  Gross per 24 hour  Intake     20 ml  Output      0 ml  Net     20 ml    Last BM: 10/12/14  Labs:   Recent Labs Lab 10/12/14 2356 10/14/14 0705  NA 137 142  K 2.7* 2.9*  CL 96 103  CO2 28 30  BUN 10 7  CREATININE 1.09 0.86  CALCIUM 10.1 9.3  GLUCOSE 136* 102*    CBG (last 3)  No results for input(s): GLUCAP in the last 72 hours.  Scheduled Meds: . cefTRIAXone (ROCEPHIN)  IV  1 g Intravenous Q24H  . donepezil  5 mg Oral QHS  . enoxaparin (LOVENOX) injection  40 mg Subcutaneous Q24H  . feeding supplement (ENSURE COMPLETE)  237 mL Oral BID BM  . letrozole  2.5 mg Oral Daily  . losartan  100 mg Oral q morning - 10a  . potassium chloride  40 mEq Oral BID  . sodium chloride  10-40 mL Intracatheter Q12H    Continuous Infusions: . sodium chloride 75 mL/hr at 10/14/14 0114    Past Medical History  Diagnosis Date  . Cataract   . Hyperlipidemia   . History of neck surgery cervical laminectomy  . Urethral polyp removed AUG 2010  . Stress incontinence, female   . Migraines   . PONV (postoperative nausea and vomiting)   . H/O exercise stress test     done in PCP- office, maybe 15 yrs. ago   . Cancer     right breast  . Aromatase inhibitor use Nov. 2012 -Continues      Neoadjuvant  Letrozole for Invasive Mammary Carcinoma with Lobular features of the right Breast - Response.  Planned for "minimum of 5 years"  . Breast cancer 06/16/11    Right Breast   . Generalized headaches     Past Surgical History  Procedure Laterality Date  . Cholecystectomy  1993  . Tubal ligation  1977  . Bladder surgery  2011    growth attached to bladder stem   . Bilateral salpingoophorectomy    . Ruptured disc surgery 1988  1988  . Kidney stones    . Modified mastectomy  12/28/2011    Procedure: MODIFIED MASTECTOMY;  Surgeon: Pedro Earls, MD;  Location: [place];  Service: General;  Laterality: Right;  Right modified mastectomy/   . Breast surgery  12/01/2011    Right breast lumpectomy  . Needle core biopsy  11/02/11    Left Breast Needle Core Biopsy, 9'Oclocl, No Malignancy: Biospy Axilla - Mammary Carcinoma, ER/PR Positive, Low Ki67  . Needle core biopsy  06/16/11    Right Breast - Invasive Mammary with Lobular features  . Abdominal hysterectomy  1997    complete    Colman Cater MS,RD,CSG,LDN Office: #[phone] Pager: [phone]

## 2014-10-14 NOTE — Progress Notes (Signed)
UR chart review completed.  

## 2014-10-14 NOTE — Progress Notes (Signed)
Subjective: She says she feels better. She looks significantly improved from yesterday but is still confused.  Objective: Vital signs in last 24 hours: Temp:  [98 F (36.7 C)-98.6 F (37 C)] 98 F (36.7 C) (02/15 0644) Pulse Rate:  [79-100] 100 (02/15 0644) Resp:  [19-20] 19 (02/15 0644) BP: (144-161)/(62-82) 144/62 mmHg (02/15 0644) SpO2:  [96 %-99 %] 99 % (02/15 0644) Weight change:  Last BM Date: 10/12/14  Intake/Output from previous day: 02/14 0701 - 02/15 0700 In: 20 [I.V.:20] Out: -   PHYSICAL EXAM General appearance: alert, no distress and Confused Resp: clear to auscultation bilaterally Cardio: regular rate and rhythm, S1, S2 normal, no murmur, click, rub or gallop GI: soft, non-tender; bowel sounds normal; no masses,  no organomegaly Extremities: extremities normal, atraumatic, no cyanosis or edema  Lab Results:  Results for orders placed or performed during the hospital encounter of 10/12/14 (from the past 48 hour(s))  CBC with Differential/Platelet     Status: Abnormal   Collection Time: 10/12/14 11:56 PM  Result Value Ref Range   WBC 12.1 (H) 4.0 - 10.5 K/uL   RBC 4.38 3.87 - 5.11 MIL/uL   Hemoglobin 13.5 12.0 - 15.0 g/dL   HCT 40.8 36.0 - 46.0 %   MCV 93.2 78.0 - 100.0 fL   MCH 30.8 26.0 - 34.0 pg   MCHC 33.1 30.0 - 36.0 g/dL   RDW 12.8 11.5 - 15.5 %   Platelets 406 (H) 150 - 400 K/uL   Neutrophils Relative % 80 (H) 43 - 77 %   Neutro Abs 9.6 (H) 1.7 - 7.7 K/uL   Lymphocytes Relative 13 12 - 46 %   Lymphs Abs 1.6 0.7 - 4.0 K/uL   Monocytes Relative 7 3 - 12 %   Monocytes Absolute 0.9 0.1 - 1.0 K/uL   Eosinophils Relative 0 0 - 5 %   Eosinophils Absolute 0.0 0.0 - 0.7 K/uL   Basophils Relative 0 0 - 1 %   Basophils Absolute 0.0 0.0 - 0.1 K/uL  Basic metabolic panel     Status: Abnormal   Collection Time: 10/12/14 11:56 PM  Result Value Ref Range   Sodium 137 135 - 145 mmol/L   Potassium 2.7 (LL) 3.5 - 5.1 mmol/L    Comment: RESULT REPEATED AND  VERIFIED CRITICAL RESULT CALLED TO, READ BACK BY AND VERIFIED WITH:  TALBOTT,T @ 0105 ON 10/13/14/ BY WOODIE,J    Chloride 96 96 - 112 mmol/L   CO2 28 19 - 32 mmol/L   Glucose, Bld 136 (H) 70 - 99 mg/dL   BUN 10 6 - 23 mg/dL   Creatinine, Ser 1.09 0.50 - 1.10 mg/dL   Calcium 10.1 8.4 - 10.5 mg/dL   GFR calc non Af Amer 49 (L) >90 mL/min   GFR calc Af Amer 57 (L) >90 mL/min    Comment: (NOTE) The eGFR has been calculated using the CKD EPI equation. This calculation has not been validated in all clinical situations. eGFR's persistently <90 mL/min signify possible Chronic Kidney Disease.    Anion gap 13 5 - 15  Troponin I     Status: None   Collection Time: 10/12/14 11:56 PM  Result Value Ref Range   Troponin I <0.03 <0.031 ng/mL    Comment:        NO INDICATION OF MYOCARDIAL INJURY.   Urinalysis, Routine w reflex microscopic     Status: Abnormal   Collection Time: 10/13/14  1:14 AM  Result Value Ref Range  Color, Urine YELLOW YELLOW   APPearance CLOUDY (A) CLEAR   Specific Gravity, Urine 1.025 1.005 - 1.030   pH 7.5 5.0 - 8.0   Glucose, UA NEGATIVE NEGATIVE mg/dL   Hgb urine dipstick LARGE (A) NEGATIVE   Bilirubin Urine SMALL (A) NEGATIVE   Ketones, ur >80 (A) NEGATIVE mg/dL   Protein, ur 100 (A) NEGATIVE mg/dL   Urobilinogen, UA 0.2 0.0 - 1.0 mg/dL   Nitrite NEGATIVE NEGATIVE   Leukocytes, UA MODERATE (A) NEGATIVE  Urine microscopic-add on     Status: Abnormal   Collection Time: 10/13/14  1:14 AM  Result Value Ref Range   Squamous Epithelial / LPF RARE RARE   WBC, UA TOO NUMEROUS TO COUNT <3 WBC/hpf   RBC / HPF 3-6 <3 RBC/hpf   Bacteria, UA MANY (A) RARE  CBC with Differential/Platelet     Status: Abnormal   Collection Time: 10/14/14  7:05 AM  Result Value Ref Range   WBC 9.4 4.0 - 10.5 K/uL   RBC 4.11 3.87 - 5.11 MIL/uL   Hemoglobin 12.5 12.0 - 15.0 g/dL   HCT 39.1 36.0 - 46.0 %   MCV 95.1 78.0 - 100.0 fL   MCH 30.4 26.0 - 34.0 pg   MCHC 32.0 30.0 - 36.0 g/dL    RDW 13.0 11.5 - 15.5 %   Platelets 366 150 - 400 K/uL   Neutrophils Relative % 63 43 - 77 %   Neutro Abs 6.0 1.7 - 7.7 K/uL   Lymphocytes Relative 23 12 - 46 %   Lymphs Abs 2.1 0.7 - 4.0 K/uL   Monocytes Relative 12 3 - 12 %   Monocytes Absolute 1.2 (H) 0.1 - 1.0 K/uL   Eosinophils Relative 2 0 - 5 %   Eosinophils Absolute 0.2 0.0 - 0.7 K/uL   Basophils Relative 0 0 - 1 %   Basophils Absolute 0.0 0.0 - 0.1 K/uL  Basic metabolic panel     Status: Abnormal   Collection Time: 10/14/14  7:05 AM  Result Value Ref Range   Sodium 142 135 - 145 mmol/L   Potassium 2.9 (L) 3.5 - 5.1 mmol/L   Chloride 103 96 - 112 mmol/L   CO2 30 19 - 32 mmol/L   Glucose, Bld 102 (H) 70 - 99 mg/dL   BUN 7 6 - 23 mg/dL   Creatinine, Ser 0.86 0.50 - 1.10 mg/dL   Calcium 9.3 8.4 - 10.5 mg/dL   GFR calc non Af Amer 65 (L) >90 mL/min   GFR calc Af Amer 76 (L) >90 mL/min    Comment: (NOTE) The eGFR has been calculated using the CKD EPI equation. This calculation has not been validated in all clinical situations. eGFR's persistently <90 mL/min signify possible Chronic Kidney Disease.    Anion gap 9 5 - 15    ABGS No results for input(s): PHART, PO2ART, TCO2, HCO3 in the last 72 hours.  Invalid input(s): PCO2 CULTURES No results found for this or any previous visit (from the past 240 hour(s)). Studies/Results: Ct Head Wo Contrast  10/13/2014   CLINICAL DATA:  Increasing weakness. Worsening over the past 2 weeks.  EXAM: CT HEAD WITHOUT CONTRAST  TECHNIQUE: Contiguous axial images were obtained from the base of the skull through the vertex without intravenous contrast.  COMPARISON:  MRI 04/05/2013  FINDINGS: There is no intracranial hemorrhage, mass or evidence of acute infarction. 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 There  is severe atrophy which is probably mildly worsened from 04/05/2013. There is severe periventricular hypodensity consistent  with chronic microvascular ischemic disease.  No significant bony abnormalities are evident. Paranasal sinuses are clear.  IMPRESSION: Old ischemic changes including lacunar infarctions and severe microvascular ischemic disease. Generalized cerebral volume loss appears to have worsened since 2014. No acute findings are evident.   Electronically Signed   By: Andreas Newport M.D.   On: 10/13/2014 00:39    Medications:  Prior to Admission:  Prescriptions prior to admission  Medication Sig Dispense Refill Last Dose  . calcium-vitamin D (OSCAL WITH D) 500-200 MG-UNIT per tablet Take 1 tablet by mouth daily with breakfast.   10/11/2014  . cyanocobalamin (,VITAMIN B-12,) 1000 MCG/ML injection Inject 1,000 mcg into the muscle every 30 (thirty) days.    Past Month  . donepezil (ARICEPT) 5 MG tablet Take 1 tablet (5 mg total) by mouth at bedtime. 30 tablet 0 10/11/2014  . letrozole (FEMARA) 2.5 MG tablet Take 1 tablet (2.5 mg total) by mouth daily. 60 tablet 5 10/11/2014  . losartan (COZAAR) 100 MG tablet Take 100 mg by mouth every morning.    10/11/2014  . phenazopyridine (PYRIDIUM) 200 MG tablet Take 200 mg by mouth daily as needed for pain.   More Than A Month  . phenazopyridine (PYRIDIUM) 200 MG tablet 1 tablet 4 times a day (Patient not taking: Reported on 10/12/2014) 120 tablet 4 Taking  . phenazopyridine (PYRIDIUM) 200 MG tablet Take 1 tablet (200 mg total) by mouth 3 (three) times daily as needed for pain. (Patient not taking: Reported on 10/12/2014) 90 tablet 11    Scheduled: . cefTRIAXone (ROCEPHIN)  IV  1 g Intravenous Q24H  . donepezil  5 mg Oral QHS  . enoxaparin (LOVENOX) injection  40 mg Subcutaneous Q24H  . feeding supplement (ENSURE COMPLETE)  237 mL Oral BID BM  . letrozole  2.5 mg Oral Daily  . losartan  100 mg Oral q morning - 10a  . sodium chloride  10-40 mL Intracatheter Q12H   Continuous: . sodium chloride 75 mL/hr at 10/14/14 0114   AXK:[id number] **OR** acetaminophen,  ondansetron **OR** ondansetron (ZOFRAN) IV, sodium chloride  Assesment: She was admitted with UTI. At baseline she has dementia and she is worse with her confusion since the UTI. She is better today. She has hypokalemia. 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 Replace her potassium.    LOS: 1 day   Moksh Loomer L 10/14/2014, 9:54 AM

## 2014-10-15 DIAGNOSIS — N3941 Urge incontinence: Secondary | ICD-10-CM

## 2014-10-15 DIAGNOSIS — N390 Urinary tract infection, site not specified: Secondary | ICD-10-CM

## 2014-10-15 LAB — BASIC METABOLIC PANEL
Anion gap: 9 (ref 5–15)
BUN: 5 mg/dL — AB (ref 6–23)
CALCIUM: 9.4 mg/dL (ref 8.4–10.5)
CO2: 27 mmol/L (ref 19–32)
CREATININE: 0.76 mg/dL (ref 0.50–1.10)
Chloride: 105 mmol/L (ref 96–112)
GFR calc Af Amer: >90 mL/min (ref >90)
GFR, EST NON AFRICAN AMERICAN: 82 mL/min — AB (ref >90)
GLUCOSE: 96 mg/dL (ref 70–99)
Potassium: 3.9 mmol/L (ref 3.5–5.1)
Sodium: 141 mmol/L (ref 135–145)

## 2014-10-15 NOTE — Clinical Social Work Placement (Signed)
Clinical Social Work Department CLINICAL SOCIAL WORK PLACEMENT NOTE 10/15/2014  Patient:  Shelly Willis, Shelly Willis  Account Number:  [id number] Admit date:  10/12/2014  Clinical Social Worker:  Benay Pike, LCSW  Date/time:  10/15/2014 03:46 PM  Clinical Social Work is seeking post-discharge placement for this patient at the following level of care:   [place]   (*CSW will update this form in Epic as items are completed)   10/15/2014  Patient/family provided with [facility] Department of Clinical Social Work's list of facilities offering this level of care within the geographic area requested by the patient (or if unable, by the patient's family).  10/15/2014  Patient/family informed of their freedom to choose among providers that offer the needed level of care, that participate in Medicare, Medicaid or managed care program needed by the patient, have an available bed and are willing to accept the patient.  10/15/2014  Patient/family informed of [facility]' ownership interest in [facility], as well as of the fact that they are under no obligation to receive care at this facility.  PASARR submitted to EDS on 10/15/2014 PASARR number received on 10/15/2014  FL2 transmitted to all facilities in geographic area requested by pt/family on  10/15/2014 FL2 transmitted to all facilities within larger geographic area on   Patient informed that his/her managed care company has contracts with or will negotiate with  certain facilities, including the following:     Patient/family informed of bed offers received:   Patient chooses bed at  Physician recommends and patient chooses bed at    Patient to be transferred to  on   Patient to be transferred to facility by  Patient and family notified of transfer on  Name of family member notified:    The following physician request were entered in Epic:   Additional Comments:  Benay Pike, [place]

## 2014-10-15 NOTE — Clinical Social Work Psychosocial (Signed)
Clinical Social Work Department BRIEF PSYCHOSOCIAL ASSESSMENT 10/15/2014  Patient:  FLARA, STORTI     Account Number:  [id number]     Admit date:  10/12/2014  Clinical Social Worker:  Wyatt Haste  Date/Time:  10/15/2014 03:48 PM  Referred by:  CSW  Date Referred:  10/15/2014 Referred for  SNF Placement   Other Referral:   Interview type:  Patient Other interview type:   husband- Tedd    PSYCHOSOCIAL DATA Living Status:  HUSBAND Admitted from facility:   Level of care:   Primary support name:  Tedd Primary support relationship to patient:  SPOUSE Degree of support available:   supportive    CURRENT CONCERNS Current Concerns  Post-Acute Placement   Other Concerns:    SOCIAL WORK ASSESSMENT / PLAN CSW met with pt's husband on phone as pt is oriented to self only. Pt's husband reports that pt was doing okay at home until about 2 weeks ago when she became lethargic and had a shuffling gate. She required much more assist with ADLs. After several near falls that he was able to catch pt, he called EMS. Pt admitted due to UTI. He states that pt was diagnosed with early stages of dementia in November by neurologist. He explains that pt has had cognitive difficulty for some time. CSW provided brief support. PT evaluated pt this afternoon and recommendation is for SNF. CSW discussed this with him and he was very positive about possibility of rehab helping pt. SNF list left in room. Husband is only interested in [facility] at this point, and will consider other options if this facility is not available.   Assessment/plan status:  Psychosocial Support/Ongoing Assessment of Needs Other assessment/ plan:   Information/referral to community resources:   SNF list    PATIENT'S/FAMILY'S RESPONSE TO PLAN OF CARE: Pt unable to participate in assessment. Pt's husband agreeable to send clinicals to [facility]. CSW will follow up.       Benay Pike, [place]

## 2014-10-15 NOTE — Consult Note (Signed)
Urology Consult   Physician requesting consult:  Juanetta Gosling  Reason for consult: Urinary tract infection  History of Present Illness: Shelly Willis is a 74 y.o. female who was admitted to the hospital here recently for altered mental status as well as urinary tract infection. She has a history of dementia. This is a, but little bit more progressive recently.  Urologic history significant for what sounds like a mid urethral sling within the past 5-6 years performed here in [employer]. Also, apparently she had removal of a "urethral polyp" as well. She has a history of urinary incontinence over the past several months-she was put on myrbetriq for a while back in September 2015. This apparently helped her urinary incontinence, but was unfortunately too expensive for the patient's family to afford. She has been on Pyridium since that time without significant relief of her urinary incontinence. She wets every day. She wears depends. The patient has had increased leakage recently with an increased volume of urine in her depends, but she has been staying in bed more, and not getting up to go to the bathroom. Her husband (I called him on the phone to get history) states that she has had no blood in her urine, and has not been complaining about pelvic or bladder pain. She has had no fever or chills.  She was apparently treated for urinary tract infection in September, 2015. Urine grew a pansensitive Escherichia coli. She presented to her gynecologist because of the increased leakage. It was felt perhaps due to her urinary tract infection which was treated. However, leakage did not improve after that. It is been persistent since that time.  The patient has a recent history of breast cancer, and is currently on hormonal therapy for that.  Past Medical History  Diagnosis Date  . Cataract   . Hyperlipidemia   . History of neck surgery cervical laminectomy  . Urethral polyp removed AUG 2010  . Stress  incontinence, female   . Migraines   . PONV (postoperative nausea and vomiting)   . H/O exercise stress test     done in PCP- office, maybe 15 yrs. ago   . Cancer     right breast  . Aromatase inhibitor use Nov. 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  Planned for "minimum of 5 years"  . Breast cancer 06/16/11    Right Breast   . Generalized headaches     Past Surgical History  Procedure Laterality Date  . Cholecystectomy  1993  . Tubal ligation  1977  . Bladder surgery  2011    growth attached to bladder stem   . Bilateral salpingoophorectomy    . Ruptured disc surgery 1988  1988  . Kidney stones    . Modified mastectomy  12/28/2011    Procedure: MODIFIED MASTECTOMY;  Surgeon: Valarie Merino, MD;  Location: [facility];  Service: General;  Laterality: Right;  Right modified mastectomy/   . Breast surgery  12/01/2011    Right breast lumpectomy  . Needle core biopsy  11/02/11    Left Breast Needle Core Biopsy, 9'Oclocl, No Malignancy: Biospy Axilla - Mammary Carcinoma, ER/PR Positive, Low Ki67  . Needle core biopsy  06/16/11    Right Breast - Invasive Mammary with Lobular features  . Abdominal hysterectomy  1997    complete     Current Hospital Medications: Scheduled Meds: . cefTRIAXone (ROCEPHIN)  IV  1 g Intravenous Q24H  . donepezil  5 mg Oral QHS  . enoxaparin (LOVENOX) injection  40 mg Subcutaneous Q24H  . feeding supplement (ENSURE COMPLETE)  237 mL Oral BID BM  . letrozole  2.5 mg Oral Daily  . losartan  100 mg Oral q morning - 10a  . potassium chloride  40 mEq Oral BID  . sodium chloride  10-40 mL Intracatheter Q12H   Continuous Infusions: . sodium chloride 75 mL/hr at 10/15/14 1643   PRN Meds:.acetaminophen **OR** acetaminophen, ondansetron **OR** ondansetron (ZOFRAN) IV, sodium chloride  Allergies:  Allergies  Allergen Reactions  . Vesicare [Solifenacin  Succinate] Other (See Comments)    Headaches and upset stomach     Family History  Problem Relation Age of Onset  . Cancer Neg Hx   . Stroke Mother     in mid 14's    Social History:  reports that she quit smoking about 27 years ago. Her smoking use included Cigarettes. She smoked 1.00 pack per day. She has never used smokeless tobacco. She reports that she drinks about 0.6 oz of alcohol per week. She reports that she does not use illicit drugs.  ROS: A complete review of systems was performed.  All systems are negative except for pertinent findings as noted.  Physical Exam:  Vital signs in last 24 hours: Temp:  [98.4 F (36.9 C)-99 F (37.2 C)] 98.9 F (37.2 C) (02/16 1330) Pulse Rate:  [83-97] 83 (02/16 1330) Resp:  [18] 18 (02/16 1330) BP: (151-198)/(59-82) 183/69 mmHg (02/16 1334) SpO2:  [95 %-98 %] 95 % (02/16 0644) General:  Alert but not oriented. She is in no acute distress. HEENT: Normocephalic, atraumatic Neck: No JVD or lymphadenopathy Cardiovascular: Regular rate  Lungs: Normal inspiratory and expiratory excursion. No respiratory distress. Abdomen: Soft, nontender, nondistended, no abdominal masses. Well-healed surgical scar in the suprapubic area. Back: No CVA tenderness Extremities: No edema Neurologic: Not oriented to time or place.  Laboratory Data:   Recent Labs  10/12/14 2356 10/14/14 0705  WBC 12.1* 9.4  HGB 13.5 12.5  HCT 40.8 39.1  PLT 406* 366     Recent Labs  10/12/14 2356 10/14/14 0705 10/15/14 0614  NA 137 142 141  K 2.7* 2.9* 3.9  CL 96 103 105  GLUCOSE 136* 102* 96  BUN 10 7 5*  CALCIUM 10.1 9.3 9.4  CREATININE 1.09 0.86 0.76     Results for orders placed or performed during the hospital encounter of 10/12/14 (from the past 24 hour(s))  Basic metabolic panel     Status: Abnormal   Collection Time: 10/15/14  6:14 AM  Result Value Ref Range   Sodium 141 135 - 145 mmol/L   Potassium 3.9 3.5 - 5.1 mmol/L   Chloride 105 96 -  112 mmol/L   CO2 27 19 - 32 mmol/L   Glucose, Bld 96 70 - 99 mg/dL   BUN 5 (L) 6 - 23 mg/dL   Creatinine, Ser 0.76 0.50 - 1.10 mg/dL   Calcium 9.4 8.4 - 10.5 mg/dL   GFR calc non Af Amer 82 (L) >90 mL/min   GFR calc Af Amer >90 >90 mL/min   Anion gap 9 5 - 15   No results found for this or any previous visit (from the past 240 hour(s)).  Renal Function:  Recent Labs  10/12/14 2356 10/14/14 0705 10/15/14 0614  CREATININE 1.09 0.86 0.76   Estimated Creatinine Clearance: 56.4 mL/min (by C-G formula based on Cr of 0.76).  Radiologic Imaging: No results found.  I independently reviewed the above imaging studies.  Impression/Assessment:  1. Possible UTI-definite pyuria. In this postmenopausal 74 year old female, she may just be chronically colonized with a basically harmless bacteria. It is hard to say at this point if her pyuria is causing her altered mental status. Her culture back in September, 2015 was positive for a pansensitive Escherichia coli.  2. Urinary incontinence. This may be due to an overactive bladder, or an overdistended bladder, as she has had urethral surgery and what sounds like a mid urethral sling in the past.  Plan:  1. I will order bladder scans check urinary residual volumes. If there is a large residual urine volume, consistently over 300 mL, I would consider an indwelling Foley catheter at least temporarily  2. I will also order renal ultrasound to assess upper tracts.  3. I would be careful about treating relatively asymptomatic pyuria. It may well be that the patient's neurologic changes or due to non-urologic issues.  4. I will not be back here till next week-if there are further questions, please don't hesitate to contact me.  Beeper number-[phone].

## 2014-10-15 NOTE — Care Management Note (Addendum)
    Page 1 of 1   10/16/2014     12:54:17 PM CARE MANAGEMENT NOTE 10/16/2014  Patient:  Shelly Willis, Shelly Willis   Account Number:  [id number]  Date Initiated:  10/15/2014  Documentation initiated by:  Theophilus Kinds  Subjective/Objective Assessment:   Pt admitted from home with UTI and encephalopathy. Pt lives with her husband but pt may need higher level of care at discharge.     Action/Plan:   PT recommends SNF at discharge. Pts husband is agreeable. CSW is aware and will start bed search.   Anticipated DC Date:  10/17/2014   Anticipated DC Plan:  SKILLED NURSING FACILITY  In-house referral  Clinical Social Worker      DC Planning Services  CM consult      Choice offered to / List presented to:             Status of service:  Completed, signed off Medicare Important Message given?  YES (If response is "NO", the following Medicare IM given date fields will be blank) Date Medicare IM given:  10/16/2014 Medicare IM given by:  Theophilus Kinds Date Additional Medicare IM given:   Additional Medicare IM given by:    Discharge Disposition:  [facility]  Per UR Regulation:    If discussed at Long Length of Stay Meetings, dates discussed:    Comments:  10/16/14 [place], RN BSN CM Pt discharged to [facility] center today. CSW to arrange discharge facility.  10/15/14 [place], RN BSN CM

## 2014-10-15 NOTE — Progress Notes (Signed)
Subjective: She is more alert but still somewhat confused  Objective: Vital signs in last 24 hours: Temp:  [97.8 F (36.6 C)-99 F (37.2 C)] 99 F (37.2 C) (02/16 0644) Pulse Rate:  [88-100] 97 (02/16 0644) Resp:  [18] 18 (02/16 0644) BP: (151-181)/(59-82) 181/82 mmHg (02/16 0644) SpO2:  [95 %-100 %] 95 % (02/16 0644) Weight change:  Last BM Date: 10/12/14  Intake/Output from previous day: 02/15 0701 - 02/16 0700 In: 2615 [P.O.:840; I.V.:1725; IV Piggyback:50] Out: -   PHYSICAL EXAM General appearance: alert, cooperative and no distress Resp: clear to auscultation bilaterally Cardio: regular rate and rhythm, S1, S2 normal, no murmur, click, rub or gallop GI: soft, non-tender; bowel sounds normal; no masses,  no organomegaly Extremities: extremities normal, atraumatic, no cyanosis or edema  Lab Results:  Results for orders placed or performed during the hospital encounter of 10/12/14 (from the past 48 hour(s))  CBC with Differential/Platelet     Status: Abnormal   Collection Time: 10/14/14  7:05 AM  Result Value Ref Range   WBC 9.4 4.0 - 10.5 K/uL   RBC 4.11 3.87 - 5.11 MIL/uL   Hemoglobin 12.5 12.0 - 15.0 g/dL   HCT 39.1 36.0 - 46.0 %   MCV 95.1 78.0 - 100.0 fL   MCH 30.4 26.0 - 34.0 pg   MCHC 32.0 30.0 - 36.0 g/dL   RDW 13.0 11.5 - 15.5 %   Platelets 366 150 - 400 K/uL   Neutrophils Relative % 63 43 - 77 %   Neutro Abs 6.0 1.7 - 7.7 K/uL   Lymphocytes Relative 23 12 - 46 %   Lymphs Abs 2.1 0.7 - 4.0 K/uL   Monocytes Relative 12 3 - 12 %   Monocytes Absolute 1.2 (H) 0.1 - 1.0 K/uL   Eosinophils Relative 2 0 - 5 %   Eosinophils Absolute 0.2 0.0 - 0.7 K/uL   Basophils Relative 0 0 - 1 %   Basophils Absolute 0.0 0.0 - 0.1 K/uL  Basic metabolic panel     Status: Abnormal   Collection Time: 10/14/14  7:05 AM  Result Value Ref Range   Sodium 142 135 - 145 mmol/L   Potassium 2.9 (L) 3.5 - 5.1 mmol/L   Chloride 103 96 - 112 mmol/L   CO2 30 19 - 32 mmol/L   Glucose,  Bld 102 (H) 70 - 99 mg/dL   BUN 7 6 - 23 mg/dL   Creatinine, Ser 0.86 0.50 - 1.10 mg/dL   Calcium 9.3 8.4 - 10.5 mg/dL   GFR calc non Af Amer 65 (L) >90 mL/min   GFR calc Af Amer 76 (L) >90 mL/min    Comment: (NOTE) The eGFR has been calculated using the CKD EPI equation. This calculation has not been validated in all clinical situations. eGFR's persistently <90 mL/min signify possible Chronic Kidney Disease.    Anion gap 9 5 - 15  Basic metabolic panel     Status: Abnormal   Collection Time: 10/15/14  6:14 AM  Result Value Ref Range   Sodium 141 135 - 145 mmol/L   Potassium 3.9 3.5 - 5.1 mmol/L    Comment: DELTA CHECK NOTED   Chloride 105 96 - 112 mmol/L   CO2 27 19 - 32 mmol/L   Glucose, Bld 96 70 - 99 mg/dL   BUN 5 (L) 6 - 23 mg/dL   Creatinine, Ser 0.76 0.50 - 1.10 mg/dL   Calcium 9.4 8.4 - 10.5 mg/dL   GFR calc non  Af Amer 82 (L) >90 mL/min   GFR calc Af Amer >90 >90 mL/min    Comment: (NOTE) The eGFR has been calculated using the CKD EPI equation. This calculation has not been validated in all clinical situations. eGFR's persistently <90 mL/min signify possible Chronic Kidney Disease.    Anion gap 9 5 - 15    ABGS No results for input(s): PHART, PO2ART, TCO2, HCO3 in the last 72 hours.  Invalid input(s): PCO2 CULTURES No results found for this or any previous visit (from the past 240 hour(s)). Studies/Results: No results found.  Medications:  Prior to Admission:  Prescriptions prior to admission  Medication Sig Dispense Refill Last Dose  . calcium-vitamin D (OSCAL WITH D) 500-200 MG-UNIT per tablet Take 1 tablet by mouth daily with breakfast.   10/11/2014  . cyanocobalamin (,VITAMIN B-12,) 1000 MCG/ML injection Inject 1,000 mcg into the muscle every 30 (thirty) days.    Past Month  . donepezil (ARICEPT) 5 MG tablet Take 1 tablet (5 mg total) by mouth at bedtime. 30 tablet 0 10/11/2014  . letrozole (FEMARA) 2.5 MG tablet Take 1 tablet (2.5 mg total) by mouth  daily. 60 tablet 5 10/11/2014  . losartan (COZAAR) 100 MG tablet Take 100 mg by mouth every morning.    10/11/2014  . phenazopyridine (PYRIDIUM) 200 MG tablet Take 200 mg by mouth daily as needed for pain.   More Than A Month  . phenazopyridine (PYRIDIUM) 200 MG tablet 1 tablet 4 times a day (Patient not taking: Reported on 10/12/2014) 120 tablet 4 Taking  . phenazopyridine (PYRIDIUM) 200 MG tablet Take 1 tablet (200 mg total) by mouth 3 (three) times daily as needed for pain. (Patient not taking: Reported on 10/12/2014) 90 tablet 11    Scheduled: . cefTRIAXone (ROCEPHIN)  IV  1 g Intravenous Q24H  . donepezil  5 mg Oral QHS  . enoxaparin (LOVENOX) injection  40 mg Subcutaneous Q24H  . feeding supplement (ENSURE COMPLETE)  237 mL Oral BID BM  . letrozole  2.5 mg Oral Daily  . losartan  100 mg Oral q morning - 10a  . potassium chloride  40 mEq Oral BID  . sodium chloride  10-40 mL Intracatheter Q12H   Continuous: . sodium chloride 75 mL/hr at 10/15/14 0600   FBP:ZWCHENIDPOEUM **OR** acetaminophen, ondansetron **OR** ondansetron (ZOFRAN) IV, sodium chloride  Assesment: She has a urinary tract infection. She had poor IV access and had PICC line placed on the 14th. She is receiving IV antibiotics. She is slowly improving Principal Problem:   UTI (lower urinary tract infection) Active Problems:   Cognitive decline   Parkinsonism   Breast cancer of upper-outer quadrant of right female breast   Moderate dementia without behavioral disturbance   UTI (urinary tract infection)    Plan: Continue IV antibiotics    LOS: 2 days   Tobin Witucki L 10/15/2014, 9:03 AM

## 2014-10-15 NOTE — Evaluation (Signed)
Physical Therapy Evaluation Patient Details Name: Shelly Willis MRN: [id number] DOB: 07-19-1941 Today's Date: 10/15/2014   History of Present Illness  chief complaint of worsening generalized weakness ongoing for past 2 weeks. As per the husband patient has been lying in the bed most of the day and for the past 2 days she also had reduced ability to ambulate. Patient has nearly fallen at home 3 times, and she was caught by her husband each time. There is no history of fall with injury as per husband. Yesterday the patient again became weak and was about to fall after she had a bowel movement. And husband brought her to the ED for further evaluation. There is no history of recent nausea vomiting or diarrhea. No chest pain or shortness of breath. Patient has a limited verbal communication ability due to underlying dementia so most of the history is obtained from the patient's husband.  Clinical Impression  Pt is slow to respond to verbal cuing.  She is not safe to ambulate by herself at this time.  Pt was unable to comprehend going to a skilled facility at this time and there is no family present.  Therapist recommends SNF to improve strength and balance and reduce risk of falling.    Follow Up Recommendations SNF    Equipment Recommendations  None recommended by PT    Recommendations for Other Services       Precautions / Restrictions Precautions Precautions: Fall Restrictions Weight Bearing Restrictions: No      Mobility  Bed Mobility Overal bed mobility: Needs Assistance Bed Mobility: Supine to Sit;Sit to Supine     Supine to sit: Min assist Sit to supine: Min assist      Transfers Overall transfer level: Needs assistance Equipment used: Rolling walker (2 wheeled) Transfers: Sit to/from Stand Sit to Stand: Min assist            Ambulation/Gait Ambulation/Gait assistance: Min guard Ambulation Distance (Feet): 30 Feet Assistive device: Rolling walker (2  wheeled) Gait Pattern/deviations: Decreased step length - left;Decreased step length - right;Shuffle;Trunk flexed   Gait velocity interpretation: <1.8 ft/sec, indicative of risk for recurrent falls                Pertinent Vitals/Pain Pain Assessment: No/denies pain    Home Living Family/patient expects to be discharged to:: Private residence Living Arrangements: Spouse/significant other Available Help at Discharge: Available PRN/intermittently;Family Type of Home: House Home Access: Stairs to enter Entrance Stairs-Rails: Right Entrance Stairs-Number of Steps: 4 Home Layout: One level Home Equipment: Cane - single point      Prior Function Level of Independence: Independent with assistive device(s)               Hand Dominance   Dominant Hand: Right    Extremity/Trunk Assessment               Lower Extremity Assessment: Generalized weakness (hip mm 3-/5 ; knee 3+'5)         Communication   Communication: No difficulties  Cognition Arousal/Alertness: Lethargic Behavior During Therapy: Flat affect Overall Cognitive Status: Impaired/Different from baseline Area of Impairment: Attention;Following commands;Safety/judgement   Current Attention Level: Focused   Following Commands: Follows one step commands inconsistently Safety/Judgement: Decreased awareness of safety   Problem Solving: Slow processing      General Comments      Exercises General Exercises - Lower Extremity Ankle Circles/Pumps: Both;10 reps Quad Sets: Both;10 reps Heel Slides: Both;5 reps Straight Leg Raises: AAROM;Both;5 reps  Assessment/Plan    PT Assessment Patient needs continued PT services  PT Diagnosis Difficulty walking;Generalized weakness;Altered mental status   PT Problem List Decreased strength;Decreased activity tolerance;Decreased balance;Decreased mobility;Decreased safety awareness  PT Treatment Interventions Gait training;Therapeutic  activities;Therapeutic exercise;Functional mobility training;Balance training   PT Goals (Current goals can be found in the Care Plan section)      Frequency Min 5X/week   Barriers to discharge   none       End of Session Equipment Utilized During Treatment: Gait belt Activity Tolerance: Patient limited by fatigue Patient left: in bed;with call bell/phone within reach;with bed alarm set Nurse Communication: Mobility status         Time: 1340-1419 PT Time Calculation (min) (ACUTE ONLY): 39 min   Charges:   PT Evaluation $Initial PT Evaluation Tier I: 1 Procedure          RUSSELL,CINDY 10/15/2014, 2:22 PM

## 2014-10-16 ENCOUNTER — Inpatient Hospital Stay (HOSPITAL_COMMUNITY): Payer: Medicare Other

## 2014-10-16 ENCOUNTER — Inpatient Hospital Stay
Admission: RE | Admit: 2014-10-16 | Discharge: 2014-10-30 | Disposition: A | Discharge status: Home / Self Care | Payer: Medicare Other | Source: Ambulatory Visit | Attending: Pulmonary Disease | Admitting: Pulmonary Disease

## 2014-10-16 DIAGNOSIS — G20 Parkinson's disease: Secondary | ICD-10-CM | POA: Diagnosis not present

## 2014-10-16 DIAGNOSIS — M6281 Muscle weakness (generalized): Secondary | ICD-10-CM | POA: Diagnosis not present

## 2014-10-16 DIAGNOSIS — N390 Urinary tract infection, site not specified: Secondary | ICD-10-CM | POA: Diagnosis not present

## 2014-10-16 DIAGNOSIS — R488 Other symbolic dysfunctions: Secondary | ICD-10-CM | POA: Diagnosis not present

## 2014-10-16 DIAGNOSIS — R627 Adult failure to thrive: Secondary | ICD-10-CM | POA: Diagnosis not present

## 2014-10-16 DIAGNOSIS — R262 Difficulty in walking, not elsewhere classified: Secondary | ICD-10-CM | POA: Diagnosis not present

## 2014-10-16 DIAGNOSIS — N2889 Other specified disorders of kidney and ureter: Secondary | ICD-10-CM | POA: Diagnosis not present

## 2014-10-16 DIAGNOSIS — F0390 Unspecified dementia without behavioral disturbance: Secondary | ICD-10-CM | POA: Diagnosis not present

## 2014-10-16 DIAGNOSIS — R419 Unspecified symptoms and signs involving cognitive functions and awareness: Secondary | ICD-10-CM | POA: Diagnosis not present

## 2014-10-16 DIAGNOSIS — Z9221 Personal history of antineoplastic chemotherapy: Secondary | ICD-10-CM | POA: Diagnosis not present

## 2014-10-16 DIAGNOSIS — R278 Other lack of coordination: Secondary | ICD-10-CM | POA: Diagnosis not present

## 2014-10-16 DIAGNOSIS — I10 Essential (primary) hypertension: Secondary | ICD-10-CM | POA: Diagnosis not present

## 2014-10-16 DIAGNOSIS — E782 Mixed hyperlipidemia: Secondary | ICD-10-CM | POA: Diagnosis not present

## 2014-10-16 DIAGNOSIS — N3289 Other specified disorders of bladder: Secondary | ICD-10-CM | POA: Diagnosis not present

## 2014-10-16 DIAGNOSIS — Z9181 History of falling: Secondary | ICD-10-CM | POA: Diagnosis not present

## 2014-10-16 MED ORDER — AMLODIPINE BESYLATE 5 MG PO TABS: 5.0000 mg | ORAL_TABLET | Freq: Every day | ORAL | Status: DC

## 2014-10-16 MED ORDER — ENSURE COMPLETE PO LIQD: 237.0000 mL | Freq: Two times a day (BID) | ORAL | Status: AC

## 2014-10-16 MED ORDER — CEFUROXIME AXETIL 250 MG PO TABS: 250.0000 mg | ORAL_TABLET | Freq: Two times a day (BID) | ORAL | Status: DC

## 2014-10-16 MED ORDER — ACETAMINOPHEN 325 MG PO TABS: 650.0000 mg | ORAL_TABLET | Freq: Four times a day (QID) | ORAL | Status: AC | PRN

## 2014-10-16 MED ORDER — ONDANSETRON HCL 4 MG PO TABS: 4.0000 mg | ORAL_TABLET | Freq: Four times a day (QID) | ORAL | Status: DC | PRN

## 2014-10-16 MED ORDER — ENOXAPARIN SODIUM 40 MG/0.4ML ~~LOC~~ SOLN: 40.0000 mg | SUBCUTANEOUS | Status: DC

## 2014-10-16 MED ORDER — AMLODIPINE BESYLATE 5 MG PO TABS
5.0000 mg | ORAL_TABLET | Freq: Every day | ORAL | Status: DC
  Administered 2014-10-16: 5 mg via ORAL
  Filled 2014-10-16: qty 1

## 2014-10-16 NOTE — Discharge Summary (Signed)
Physician Discharge Summary  [name] MRN: [id number] DOB/AGE: Apr 08, 1941 74 y.o. Primary Care Physician:Kyliee Ortego L, MD Admit date: 10/12/2014 Discharge date: 10/16/2014    Discharge Diagnoses:   Principal Problem:   UTI (lower urinary tract infection) Active Problems:   Cognitive decline   Parkinsonism   Breast cancer of upper-outer quadrant of right female breast   Moderate dementia without behavioral disturbance   UTI (urinary tract infection)  hypertension Hypokalemia Dementia   Medication List    STOP taking these medications        phenazopyridine 200 MG tablet  Commonly known as:  PYRIDIUM      TAKE these medications        acetaminophen 325 MG tablet  Commonly known as:  TYLENOL  Take 2 tablets (650 mg total) by mouth every 6 (six) hours as needed for mild pain (or Fever >/= 101).     amLODipine 5 MG tablet  Commonly known as:  NORVASC  Take 1 tablet (5 mg total) by mouth daily.     calcium-vitamin D 500-200 MG-UNIT per tablet  Commonly known as:  OSCAL WITH D  Take 1 tablet by mouth daily with breakfast.     cefUROXime 250 MG tablet  Commonly known as:  CEFTIN  Take 1 tablet (250 mg total) by mouth 2 (two) times daily with a meal.     cyanocobalamin 1000 MCG/ML injection  Commonly known as:  (VITAMIN B-12)  Inject 1,000 mcg into the muscle every 30 (thirty) days.     donepezil 5 MG tablet  Commonly known as:  ARICEPT  Take 1 tablet (5 mg total) by mouth at bedtime.     enoxaparin 40 MG/0.4ML injection  Commonly known as:  LOVENOX  Inject 0.4 mLs (40 mg total) into the skin daily. For 10 days     feeding supplement (ENSURE COMPLETE) Liqd  Take 237 mLs by mouth 2 (two) times daily between meals.     letrozole 2.5 MG tablet  Commonly known as:  FEMARA  Take 1 tablet (2.5 mg total) by mouth daily.     losartan 100 MG tablet  Commonly known as:  COZAAR  Take 100 mg by mouth every morning.     ondansetron 4 MG tablet   Commonly known as:  ZOFRAN  Take 1 tablet (4 mg total) by mouth every 6 (six) hours as needed for nausea.        Discharged Condition: Improved    Consults: Urology  Significant Diagnostic Studies: Ct Head Wo Contrast  10/13/2014   CLINICAL DATA:  Increasing weakness. Worsening over the past 2 weeks.  EXAM: CT HEAD WITHOUT CONTRAST  TECHNIQUE: Contiguous axial images were obtained from the base of the skull through the vertex without intravenous contrast.  COMPARISON:  MRI 04/05/2013  FINDINGS: There is no intracranial hemorrhage, mass or evidence of acute infarction. 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 There is severe atrophy which is probably mildly worsened from 04/05/2013. There is severe periventricular hypodensity consistent with chronic microvascular ischemic disease.  No significant bony abnormalities are evident. Paranasal sinuses are clear.  IMPRESSION: Old ischemic changes including lacunar infarctions and severe microvascular ischemic disease. Generalized cerebral volume loss appears to have worsened since 2014. No acute findings are evident.   Electronically Signed   By: Andreas Newport M.D.   On: 10/13/2014 00:39   US Renal  10/16/2014   CLINICAL DATA:  Urinary tract  infection. History of breast cancer and urethral polyps. History renal stones.  EXAM: RENAL/URINARY TRACT ULTRASOUND COMPLETE  COMPARISON:  CT abdomen and pelvis - 02/28/2009 ; pelvic MRI - 03/25/2009  FINDINGS: Right Kidney:  Normal cortical thickness, echogenicity and size, measuring 9.9 cm in length. No focal renal lesions. No echogenic renal stones. There is mild pelvicaliectasis involving the superior pole the right kidney (image 13), the etiology of which is not depicted on this examination though represents an interval change since prior abdominal CT performed 02/28/2009.  Left Kidney:  Normal cortical thickness, echogenicity and size, measuring  10.1 cm in length. No focal renal lesions. No echogenic renal stones. No urinary obstruction.  Bladder:  There is a punctate (approximately 0.7 cm) echogenic structure within the otherwise underdistended urinary bladder which is favored to represent a bladder stone. The previous identified large (approximately 2.6 cm) calcification within urethra (as seen on prior pelvic MRI and abdominal CT) is not well demonstrated on the present examination.  IMPRESSION: 1. Apparent mild focal pelvicaliectasis involving the superior pole the right kidney, the etiology of which is not depicted on the present examination though this finding represents an interval change since prior remote abdominal CT performed 02/28/2009. Further evaluation could be performed with abdominal CT (either noncontrast or dedicated renal protocol depending on patient's renal function) as clinically indicated. 2. Apparent punctate (0.7 cm) echogenic structure within the urinary bladder is favored to represent a bladder stone. 3. The previously identified large urethral calcification (demonstrated on remote pelvic MRI an abdominal CT) is not well demonstrated on the present examination though resolution should not be assumed on the basis of this examination.   Electronically Signed   By: Sandi Mariscal M.D.   On: 10/16/2014 08:02    Lab Results: Basic Metabolic Panel:  Recent Labs  10/14/14 0705 10/15/14 0614  NA 142 141  K 2.9* 3.9  CL 103 105  CO2 30 27  GLUCOSE 102* 96  BUN 7 5*  CREATININE 0.86 0.76  CALCIUM 9.3 9.4   Liver Function Tests: No results for input(s): AST, ALT, ALKPHOS, BILITOT, PROT, ALBUMIN in the last 72 hours.   CBC:  Recent Labs  10/14/14 0705  WBC 9.4  NEUTROABS 6.0  HGB 12.5  HCT 39.1  MCV 95.1  PLT 366    No results found for this or any previous visit (from the past 240 hour(s)).   Hospital Course: This is a 74 year old who is brought to the emergency department because she became unable to  ambulate. She was also unable to hold her urine. In the emergency department she had pyuria but did not have her urine cultured I believe this was an oversight. She was started on Rocephin because it was felt that her acute cognitive decline was related to her urinary tract infection. She improved. She was hypokalemic and this was treated. She had PICC line because of poor IV access. Her blood pressure was up so she had amlodipine added to her previous Cozaar. She had urology consultation and had a renal ultrasound that showed some dilatation of the upper collecting system and that we'll need to be further evaluated as an outpatient. She had PT evaluation and it was felt that she was going to need skilled care facility for rehabilitation and this is being arranged she is much more alert and awake than on admission  Discharge Exam: Blood pressure 198/90, pulse 78, temperature 98.5 F (36.9 C), temperature source Oral, resp. rate 18, height 5\' 5"  (1.651  m), weight 58.605 kg (129 lb 3.2 oz), SpO2 100 %. She is awake and alert. She is confused. Her chest is clear  Disposition: To skilled care facility when bed available      Discharge Instructions    Discharge to SNF when bed available    Complete by:  As directed              Signed: Lydia Toren L   10/16/2014, 9:17 AM

## 2014-10-16 NOTE — Progress Notes (Signed)
The help from Dr. Dorina Hoyer  is noted and appreciated. She has an abnormality on her ultrasound that can be investigated as an outpatient. It has been recommended that she have skilled care facility placement and I'm going to go ahead and discharge her to skilled care facility today. I do think her cognitive decline which was acute is related to a urinary tract infection so she will receive 3 more days of by mouth antibiotics. Her blood pressure has been up so I've added amlodipine

## 2014-10-16 NOTE — Clinical Social Work Placement (Signed)
Clinical Social Work Department CLINICAL SOCIAL WORK PLACEMENT NOTE 10/16/2014  Patient:  Shelly Willis, Shelly Willis  Account Number:  [id number] Admit date:  10/12/2014  Clinical Social Worker:  Benay Pike, LCSW  Date/time:  10/15/2014 03:46 PM  Clinical Social Work is seeking post-discharge placement for this patient at the following level of care:   [place]   (*CSW will update this form in Epic as items are completed)   10/15/2014  Patient/family provided with [place] Department of Clinical Social Work's list of facilities offering this level of care within the geographic area requested by the patient (or if unable, by the patient's family).  10/15/2014  Patient/family informed of their freedom to choose among providers that offer the needed level of care, that participate in Medicare, Medicaid or managed care program needed by the patient, have an available bed and are willing to accept the patient.  10/15/2014  Patient/family informed of [facility]' ownership interest in [facility], as well as of the fact that they are under no obligation to receive care at this facility.  PASARR submitted to EDS on 10/15/2014 PASARR number received on 10/15/2014  FL2 transmitted to all facilities in geographic area requested by pt/family on  10/15/2014 FL2 transmitted to all facilities within larger geographic area on   Patient informed that his/her managed care company has contracts with or will negotiate with  certain facilities, including the following:     Patient/family informed of bed offers received:  10/16/2014 Patient chooses bed at [facility] Physician recommends and patient chooses bed at    Patient to be transferred to [facility] on  10/16/2014 Patient to be transferred to facility by RN Patient and family notified of transfer on 10/16/2014 Name of family member notified:  Tedd-husband  The following physician request were entered in  Epic:   Additional Comments:  Benay Pike, [place]

## 2014-10-16 NOTE — Progress Notes (Signed)
Patient discharged to [facility].  PICC removed - WNL.  Report called and given to [place] at [facility].  Patients family aware of transfer, no questions at this time.  Patient stable to DC, taken over to facility in bed.

## 2014-10-16 NOTE — Clinical Social Work Note (Signed)
Pt's husband accepts bed offer at [facility]. Facility notified. Pt d/c today and will transfer with RN.   Benay Pike, [place]

## 2014-10-17 NOTE — Progress Notes (Signed)
UR chart review completed.  

## 2014-10-19 NOTE — Discharge Summary (Signed)
[name], Shelly Willis           ACCOUNT NO.:  [id number]  MEDICAL RECORD NO.:  [id number]  LOCATION:  S115                          FACILITY:  [facility]  PHYSICIAN:  Akhilesh Sassone L. Luan Pulling, M.D.DATE OF BIRTH:  Oct 07, 1940  DATE OF ADMISSION:  10/16/2014 DATE OF DISCHARGE:  [facility]                              DISCHARGE SUMMARY   ADDENDUM:  DISCHARGE DIAGNOSIS:  Severe malnutrition in the context of chronic illness.     [name] L. Luan Pulling, M.D.     ELH/MEDQ  D:  10/19/2014  T:  10/19/2014  Job:  [id number]

## 2014-10-21 ENCOUNTER — Encounter (HOSPITAL_COMMUNITY)
Admission: RE | Admit: 2014-10-21 | Discharge: 2014-10-21 | Disposition: A | Discharge status: Home / Self Care | Payer: Medicare Other | Source: Skilled Nursing Facility | Attending: Pulmonary Disease | Admitting: Pulmonary Disease

## 2014-10-21 LAB — BASIC METABOLIC PANEL
Anion gap: 7 (ref 5–15)
BUN: 21 mg/dL (ref 6–23)
CHLORIDE: 98 mmol/L (ref 96–112)
CO2: 29 mmol/L (ref 19–32)
Calcium: 10.3 mg/dL (ref 8.4–10.5)
Creatinine, Ser: 1.04 mg/dL (ref 0.50–1.10)
GFR calc Af Amer: 60 mL/min — ABNORMAL LOW (ref >90)
GFR, EST NON AFRICAN AMERICAN: 52 mL/min — AB (ref >90)
GLUCOSE: 103 mg/dL — AB (ref 70–99)
POTASSIUM: 4.5 mmol/L (ref 3.5–5.1)
SODIUM: 134 mmol/L — AB (ref 135–145)

## 2014-10-24 ENCOUNTER — Ambulatory Visit: Payer: Medicare Other | Admitting: Diagnostic Neuroimaging

## 2014-10-27 DIAGNOSIS — R627 Adult failure to thrive: Secondary | ICD-10-CM | POA: Diagnosis not present

## 2014-10-27 NOTE — H&P (Signed)
Shelly Willis MRN: [id number] DOB/AGE: 09-30-1940 74 y.o. Primary Care Physician:Erminia Mcnew L, MD Admit date: 10/16/2014 Chief Complaint: Weakness HPI: This is a 74 year old who was admitted to the hospital because of extreme weakness. She had been seen there given IV fluids potassium replacement etc. and was felt to have a urinary tract infection. This was treated. She had PT consultation and it was suggested that she have skilled care facility placement for rehabilitation which is ongoing. She says she feels better. She has no new complaints. Dementia related to cancer chemotherapy and perhaps some baseline dementia as well  Past Medical History  Diagnosis Date  . Cataract   . Hyperlipidemia   . History of neck surgery cervical laminectomy  . Urethral polyp removed AUG 2010  . Stress incontinence, female   . Migraines   . PONV (postoperative nausea and vomiting)   . H/O exercise stress test     done in PCP- office, maybe 15 yrs. ago   . Cancer     right breast  . Aromatase inhibitor use Nov. 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  Planned for "minimum of 5 years"  . Breast cancer 06/16/11    Right Breast   . Generalized headaches    Past Surgical History  Procedure Laterality Date  . Cholecystectomy  1993  . Tubal ligation  1977  . Bladder surgery  2011    growth attached to bladder stem   . Bilateral salpingoophorectomy    . Ruptured disc surgery 1988  1988  . Kidney stones    . Modified mastectomy  12/28/2011    Procedure: MODIFIED MASTECTOMY;  Surgeon: Pedro Earls, MD;  Location: [facility];  Service: General;  Laterality: Right;  Right modified mastectomy/   . Breast surgery  12/01/2011    Right breast lumpectomy  . Needle core biopsy  11/02/11    Left Breast Needle Core Biopsy, 9'Oclocl, No Malignancy: Biospy Axilla - Mammary Carcinoma, ER/PR Positive, Low Ki67   . Needle core biopsy  06/16/11    Right Breast - Invasive Mammary with Lobular features  . Abdominal hysterectomy  1997    complete        Family History  Problem Relation Age of Onset  . Cancer Neg Hx   . Stroke Mother     in mid 55's   Social History:  reports that she quit smoking about 27 years ago. Her smoking use included Cigarettes. She smoked 1.00 pack per day. She has never used smokeless tobacco. She reports that she drinks about 0.6 oz of alcohol per week. She reports that she does not use illicit drugs.   Allergies:  Allergies  Allergen Reactions  . Vesicare [Solifenacin Succinate] Other (See Comments)    Headaches and upset stomach     Medications Prior to Admission  Medication Sig Dispense Refill  . acetaminophen (TYLENOL) 325 MG tablet Take 2 tablets (650 mg total) by mouth every 6 (six) hours as needed for mild pain (or Fever >/= 101).    Marland Kitchen amLODipine (NORVASC) 5 MG tablet Take 1 tablet (5 mg total) by mouth daily.    . calcium-vitamin D (OSCAL WITH D) 500-200 MG-UNIT per tablet Take 1 tablet by mouth daily with breakfast.    . cefUROXime (CEFTIN) 250 MG tablet Take 1 tablet (250 mg total) by mouth 2 (two) times daily with a meal. 6 tablet   .  cyanocobalamin (,VITAMIN B-12,) 1000 MCG/ML injection Inject 1,000 mcg into the muscle every 30 (thirty) days.     Marland Kitchen donepezil (ARICEPT) 5 MG tablet Take 1 tablet (5 mg total) by mouth at bedtime. 30 tablet 0  . enoxaparin (LOVENOX) 40 MG/0.4ML injection Inject 0.4 mLs (40 mg total) into the skin daily. For 10 days 0 Syringe   . feeding supplement, ENSURE COMPLETE, (ENSURE COMPLETE) LIQD Take 237 mLs by mouth 2 (two) times daily between meals.    Marland Kitchen letrozole (FEMARA) 2.5 MG tablet Take 1 tablet (2.5 mg total) by mouth daily. 60 tablet 5  . losartan (COZAAR) 100 MG tablet Take 100 mg by mouth every morning.     . ondansetron (ZOFRAN) 4 MG tablet Take 1 tablet (4 mg total) by mouth every 6 (six) hours as needed for nausea.  20 tablet 0       JOI:TGPQD from the symptoms mentioned above,there are no other symptoms referable to all systems reviewed.  Physical Exam: There were no vitals taken for this visit. She is awake and alert and confused. Her pupils are reactive nose and throat clear mucous membranes are moist. Her neck is supple without masses. Her chest is relatively clear. Heart is regular without murmur gallop or rub. Abdomen is soft no masses felt extremities showed no edema   No results for input(s): WBC, NEUTROABS, HGB, HCT, MCV, PLT in the last 72 hours. No results for input(s): NA, K, CL, CO2, GLUCOSE, BUN, CREATININE, CALCIUM, MG in the last 72 hours.  Invalid input(s): PHOlablast2(ast:2,ALT:2,alkphos:2,bilitot:2,prot:2,albumin:2)@    No results found for this or any previous visit (from the past 240 hour(s)).   Ct Head Wo Contrast  10/13/2014   CLINICAL DATA:  Increasing weakness. Worsening over the past 2 weeks.  EXAM: CT HEAD WITHOUT CONTRAST  TECHNIQUE: Contiguous axial images were obtained from the base of the skull through the vertex without intravenous contrast.  COMPARISON:  MRI 04/05/2013  FINDINGS: There is no intracranial hemorrhage, mass or evidence of acute infarction. 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 There is severe atrophy which is probably mildly worsened from 04/05/2013. There is severe periventricular hypodensity consistent with chronic microvascular ischemic disease.  No significant bony abnormalities are evident. Paranasal sinuses are clear.  IMPRESSION: Old ischemic changes including lacunar infarctions and severe microvascular ischemic disease. Generalized cerebral volume loss appears to have worsened since 2014. No acute findings are evident.   Electronically Signed   By: Andreas Newport M.D.   On: 10/13/2014 00:39   US Renal  10/16/2014   CLINICAL DATA:  Urinary tract infection. History of breast cancer  and urethral polyps. History renal stones.  EXAM: RENAL/URINARY TRACT ULTRASOUND COMPLETE  COMPARISON:  CT abdomen and pelvis - 02/28/2009 ; pelvic MRI - 03/25/2009  FINDINGS: Right Kidney:  Normal cortical thickness, echogenicity and size, measuring 9.9 cm in length. No focal renal lesions. No echogenic renal stones. There is mild pelvicaliectasis involving the superior pole the right kidney (image 13), the etiology of which is not depicted on this examination though represents an interval change since prior abdominal CT performed 02/28/2009.  Left Kidney:  Normal cortical thickness, echogenicity and size, measuring 10.1 cm in length. No focal renal lesions. No echogenic renal stones. No urinary obstruction.  Bladder:  There is a punctate (approximately 0.7 cm) echogenic structure within the otherwise underdistended urinary bladder which is favored to represent a bladder stone. The previous identified large (approximately  2.6 cm) calcification within urethra (as seen on prior pelvic MRI and abdominal CT) is not well demonstrated on the present examination.  IMPRESSION: 1. Apparent mild focal pelvicaliectasis involving the superior pole the right kidney, the etiology of which is not depicted on the present examination though this finding represents an interval change since prior remote abdominal CT performed 02/28/2009. Further evaluation could be performed with abdominal CT (either noncontrast or dedicated renal protocol depending on patient's renal function) as clinically indicated. 2. Apparent punctate (0.7 cm) echogenic structure within the urinary bladder is favored to represent a bladder stone. 3. The previously identified large urethral calcification (demonstrated on remote pelvic MRI an abdominal CT) is not well demonstrated on the present examination though resolution should not be assumed on the basis of this examination.   Electronically Signed   By: Sandi Mariscal M.D.   On: 10/16/2014 08:02    Impression: She had extreme weakness. She is better and is undergoing therapy. She has a history of breast cancer. She has a history of dementia. Active Problems:   * No active hospital problems. *     Plan: Continue current treatments.      Laporche Martelle L   10/27/2014, 10:39 AM

## 2014-10-30 NOTE — Therapy (Signed)
[name], [place], [address] Phone: [phone]   Fax:  [phone]  Patient Details  Name: Shelly Willis MRN: [id number] Date of Birth: 1940-09-21 Referring Provider:  Jackolyn Confer, MD  Encounter Date: 09/10/2014 PHYSICAL THERAPY DISCHARGE SUMMARY  Visits from Start of Care: 5 Current functional level related to goals / functional outcome: Unknown as pt went into the hospital and has not returned   Remaining deficits: Unknown as pt went into the hospital and has not returned   Education / Equipment: HEP  Plan: Patient agrees to discharge.  Patient goals were not met. Patient is being discharged due to a change in medical status.  ?????      RUSSELL,CINDY 10/30/2014, 4:00 PM  [place], [place], [address] Phone: [phone]   Fax:  [phone]

## 2014-10-30 NOTE — Addendum Note (Signed)
Encounter addended by: Leeroy Cha, PT on: 10/30/2014  4:02 PM<BR>     Documentation filed: Episodes, Clinical Notes

## 2014-11-04 DIAGNOSIS — Z87440 Personal history of urinary (tract) infections: Secondary | ICD-10-CM | POA: Diagnosis not present

## 2014-11-04 DIAGNOSIS — F0390 Unspecified dementia without behavioral disturbance: Secondary | ICD-10-CM | POA: Diagnosis not present

## 2014-11-04 DIAGNOSIS — Z9181 History of falling: Secondary | ICD-10-CM | POA: Diagnosis not present

## 2014-11-04 DIAGNOSIS — E782 Mixed hyperlipidemia: Secondary | ICD-10-CM | POA: Diagnosis not present

## 2014-11-04 DIAGNOSIS — I10 Essential (primary) hypertension: Secondary | ICD-10-CM | POA: Diagnosis not present

## 2014-11-04 DIAGNOSIS — G20 Parkinson's disease: Secondary | ICD-10-CM | POA: Diagnosis not present

## 2014-11-04 DIAGNOSIS — C50411 Malignant neoplasm of upper-outer quadrant of right female breast: Secondary | ICD-10-CM | POA: Diagnosis not present

## 2014-11-05 DIAGNOSIS — Z9181 History of falling: Secondary | ICD-10-CM | POA: Diagnosis not present

## 2014-11-05 DIAGNOSIS — E782 Mixed hyperlipidemia: Secondary | ICD-10-CM | POA: Diagnosis not present

## 2014-11-05 DIAGNOSIS — Z87440 Personal history of urinary (tract) infections: Secondary | ICD-10-CM | POA: Diagnosis not present

## 2014-11-05 DIAGNOSIS — G20 Parkinson's disease: Secondary | ICD-10-CM | POA: Diagnosis not present

## 2014-11-05 DIAGNOSIS — I10 Essential (primary) hypertension: Secondary | ICD-10-CM | POA: Diagnosis not present

## 2014-11-05 DIAGNOSIS — C50411 Malignant neoplasm of upper-outer quadrant of right female breast: Secondary | ICD-10-CM | POA: Diagnosis not present

## 2014-11-05 DIAGNOSIS — F0390 Unspecified dementia without behavioral disturbance: Secondary | ICD-10-CM | POA: Diagnosis not present

## 2014-11-08 DIAGNOSIS — Z9181 History of falling: Secondary | ICD-10-CM | POA: Diagnosis not present

## 2014-11-08 DIAGNOSIS — E782 Mixed hyperlipidemia: Secondary | ICD-10-CM | POA: Diagnosis not present

## 2014-11-08 DIAGNOSIS — G20 Parkinson's disease: Secondary | ICD-10-CM | POA: Diagnosis not present

## 2014-11-08 DIAGNOSIS — I10 Essential (primary) hypertension: Secondary | ICD-10-CM | POA: Diagnosis not present

## 2014-11-08 DIAGNOSIS — C50411 Malignant neoplasm of upper-outer quadrant of right female breast: Secondary | ICD-10-CM | POA: Diagnosis not present

## 2014-11-08 DIAGNOSIS — F0390 Unspecified dementia without behavioral disturbance: Secondary | ICD-10-CM | POA: Diagnosis not present

## 2014-11-08 DIAGNOSIS — Z87440 Personal history of urinary (tract) infections: Secondary | ICD-10-CM | POA: Diagnosis not present

## 2014-11-11 DIAGNOSIS — C50911 Malignant neoplasm of unspecified site of right female breast: Secondary | ICD-10-CM | POA: Diagnosis not present

## 2014-11-11 DIAGNOSIS — I10 Essential (primary) hypertension: Secondary | ICD-10-CM | POA: Diagnosis not present

## 2014-11-11 DIAGNOSIS — F0390 Unspecified dementia without behavioral disturbance: Secondary | ICD-10-CM | POA: Diagnosis not present

## 2014-11-11 DIAGNOSIS — E538 Deficiency of other specified B group vitamins: Secondary | ICD-10-CM | POA: Diagnosis not present

## 2014-11-11 DIAGNOSIS — Z87440 Personal history of urinary (tract) infections: Secondary | ICD-10-CM | POA: Diagnosis not present

## 2014-11-11 DIAGNOSIS — Z9181 History of falling: Secondary | ICD-10-CM | POA: Diagnosis not present

## 2014-11-11 DIAGNOSIS — R32 Unspecified urinary incontinence: Secondary | ICD-10-CM | POA: Diagnosis not present

## 2014-11-11 DIAGNOSIS — G20 Parkinson's disease: Secondary | ICD-10-CM | POA: Diagnosis not present

## 2014-11-11 DIAGNOSIS — E782 Mixed hyperlipidemia: Secondary | ICD-10-CM | POA: Diagnosis not present

## 2014-11-11 DIAGNOSIS — C50411 Malignant neoplasm of upper-outer quadrant of right female breast: Secondary | ICD-10-CM | POA: Diagnosis not present

## 2014-11-12 DIAGNOSIS — E782 Mixed hyperlipidemia: Secondary | ICD-10-CM | POA: Diagnosis not present

## 2014-11-12 DIAGNOSIS — Z87440 Personal history of urinary (tract) infections: Secondary | ICD-10-CM | POA: Diagnosis not present

## 2014-11-12 DIAGNOSIS — G20 Parkinson's disease: Secondary | ICD-10-CM | POA: Diagnosis not present

## 2014-11-12 DIAGNOSIS — C50411 Malignant neoplasm of upper-outer quadrant of right female breast: Secondary | ICD-10-CM | POA: Diagnosis not present

## 2014-11-12 DIAGNOSIS — Z9181 History of falling: Secondary | ICD-10-CM | POA: Diagnosis not present

## 2014-11-12 DIAGNOSIS — I10 Essential (primary) hypertension: Secondary | ICD-10-CM | POA: Diagnosis not present

## 2014-11-12 DIAGNOSIS — F0390 Unspecified dementia without behavioral disturbance: Secondary | ICD-10-CM | POA: Diagnosis not present

## 2014-11-14 DIAGNOSIS — G20 Parkinson's disease: Secondary | ICD-10-CM | POA: Diagnosis not present

## 2014-11-14 DIAGNOSIS — E782 Mixed hyperlipidemia: Secondary | ICD-10-CM | POA: Diagnosis not present

## 2014-11-14 DIAGNOSIS — Z87440 Personal history of urinary (tract) infections: Secondary | ICD-10-CM | POA: Diagnosis not present

## 2014-11-14 DIAGNOSIS — F0390 Unspecified dementia without behavioral disturbance: Secondary | ICD-10-CM | POA: Diagnosis not present

## 2014-11-14 DIAGNOSIS — I10 Essential (primary) hypertension: Secondary | ICD-10-CM | POA: Diagnosis not present

## 2014-11-14 DIAGNOSIS — C50411 Malignant neoplasm of upper-outer quadrant of right female breast: Secondary | ICD-10-CM | POA: Diagnosis not present

## 2014-11-14 DIAGNOSIS — Z9181 History of falling: Secondary | ICD-10-CM | POA: Diagnosis not present

## 2014-11-18 DIAGNOSIS — Z9181 History of falling: Secondary | ICD-10-CM | POA: Diagnosis not present

## 2014-11-18 DIAGNOSIS — I10 Essential (primary) hypertension: Secondary | ICD-10-CM | POA: Diagnosis not present

## 2014-11-18 DIAGNOSIS — E782 Mixed hyperlipidemia: Secondary | ICD-10-CM | POA: Diagnosis not present

## 2014-11-18 DIAGNOSIS — F0390 Unspecified dementia without behavioral disturbance: Secondary | ICD-10-CM | POA: Diagnosis not present

## 2014-11-18 DIAGNOSIS — Z87440 Personal history of urinary (tract) infections: Secondary | ICD-10-CM | POA: Diagnosis not present

## 2014-11-18 DIAGNOSIS — C50411 Malignant neoplasm of upper-outer quadrant of right female breast: Secondary | ICD-10-CM | POA: Diagnosis not present

## 2014-11-18 DIAGNOSIS — G20 Parkinson's disease: Secondary | ICD-10-CM | POA: Diagnosis not present

## 2014-11-19 DIAGNOSIS — G20 Parkinson's disease: Secondary | ICD-10-CM | POA: Diagnosis not present

## 2014-11-19 DIAGNOSIS — I10 Essential (primary) hypertension: Secondary | ICD-10-CM | POA: Diagnosis not present

## 2014-11-19 DIAGNOSIS — C50411 Malignant neoplasm of upper-outer quadrant of right female breast: Secondary | ICD-10-CM | POA: Diagnosis not present

## 2014-11-19 DIAGNOSIS — Z9181 History of falling: Secondary | ICD-10-CM | POA: Diagnosis not present

## 2014-11-19 DIAGNOSIS — Z87440 Personal history of urinary (tract) infections: Secondary | ICD-10-CM | POA: Diagnosis not present

## 2014-11-19 DIAGNOSIS — E782 Mixed hyperlipidemia: Secondary | ICD-10-CM | POA: Diagnosis not present

## 2014-11-19 DIAGNOSIS — F0390 Unspecified dementia without behavioral disturbance: Secondary | ICD-10-CM | POA: Diagnosis not present

## 2014-11-22 DIAGNOSIS — Z9181 History of falling: Secondary | ICD-10-CM | POA: Diagnosis not present

## 2014-11-22 DIAGNOSIS — C50411 Malignant neoplasm of upper-outer quadrant of right female breast: Secondary | ICD-10-CM | POA: Diagnosis not present

## 2014-11-22 DIAGNOSIS — E782 Mixed hyperlipidemia: Secondary | ICD-10-CM | POA: Diagnosis not present

## 2014-11-22 DIAGNOSIS — Z87440 Personal history of urinary (tract) infections: Secondary | ICD-10-CM | POA: Diagnosis not present

## 2014-11-22 DIAGNOSIS — G20 Parkinson's disease: Secondary | ICD-10-CM | POA: Diagnosis not present

## 2014-11-22 DIAGNOSIS — F0390 Unspecified dementia without behavioral disturbance: Secondary | ICD-10-CM | POA: Diagnosis not present

## 2014-11-22 DIAGNOSIS — I10 Essential (primary) hypertension: Secondary | ICD-10-CM | POA: Diagnosis not present

## 2014-11-25 DIAGNOSIS — F0390 Unspecified dementia without behavioral disturbance: Secondary | ICD-10-CM | POA: Diagnosis not present

## 2014-11-25 DIAGNOSIS — Z9181 History of falling: Secondary | ICD-10-CM | POA: Diagnosis not present

## 2014-11-25 DIAGNOSIS — E782 Mixed hyperlipidemia: Secondary | ICD-10-CM | POA: Diagnosis not present

## 2014-11-25 DIAGNOSIS — Z87440 Personal history of urinary (tract) infections: Secondary | ICD-10-CM | POA: Diagnosis not present

## 2014-11-25 DIAGNOSIS — C50411 Malignant neoplasm of upper-outer quadrant of right female breast: Secondary | ICD-10-CM | POA: Diagnosis not present

## 2014-11-25 DIAGNOSIS — G20 Parkinson's disease: Secondary | ICD-10-CM | POA: Diagnosis not present

## 2014-11-25 DIAGNOSIS — I10 Essential (primary) hypertension: Secondary | ICD-10-CM | POA: Diagnosis not present

## 2014-11-27 DIAGNOSIS — C50411 Malignant neoplasm of upper-outer quadrant of right female breast: Secondary | ICD-10-CM | POA: Diagnosis not present

## 2014-11-27 DIAGNOSIS — I10 Essential (primary) hypertension: Secondary | ICD-10-CM | POA: Diagnosis not present

## 2014-11-27 DIAGNOSIS — Z87440 Personal history of urinary (tract) infections: Secondary | ICD-10-CM | POA: Diagnosis not present

## 2014-11-27 DIAGNOSIS — E782 Mixed hyperlipidemia: Secondary | ICD-10-CM | POA: Diagnosis not present

## 2014-11-27 DIAGNOSIS — Z9181 History of falling: Secondary | ICD-10-CM | POA: Diagnosis not present

## 2014-11-27 DIAGNOSIS — F0390 Unspecified dementia without behavioral disturbance: Secondary | ICD-10-CM | POA: Diagnosis not present

## 2014-11-27 DIAGNOSIS — G20 Parkinson's disease: Secondary | ICD-10-CM | POA: Diagnosis not present

## 2014-12-02 DIAGNOSIS — F0390 Unspecified dementia without behavioral disturbance: Secondary | ICD-10-CM | POA: Diagnosis not present

## 2014-12-02 DIAGNOSIS — G20 Parkinson's disease: Secondary | ICD-10-CM | POA: Diagnosis not present

## 2014-12-02 DIAGNOSIS — Z9181 History of falling: Secondary | ICD-10-CM | POA: Diagnosis not present

## 2014-12-02 DIAGNOSIS — E782 Mixed hyperlipidemia: Secondary | ICD-10-CM | POA: Diagnosis not present

## 2014-12-02 DIAGNOSIS — C50411 Malignant neoplasm of upper-outer quadrant of right female breast: Secondary | ICD-10-CM | POA: Diagnosis not present

## 2014-12-02 DIAGNOSIS — I10 Essential (primary) hypertension: Secondary | ICD-10-CM | POA: Diagnosis not present

## 2014-12-02 DIAGNOSIS — Z87440 Personal history of urinary (tract) infections: Secondary | ICD-10-CM | POA: Diagnosis not present

## 2014-12-04 DIAGNOSIS — F0390 Unspecified dementia without behavioral disturbance: Secondary | ICD-10-CM | POA: Diagnosis not present

## 2014-12-04 DIAGNOSIS — G20 Parkinson's disease: Secondary | ICD-10-CM | POA: Diagnosis not present

## 2014-12-04 DIAGNOSIS — E782 Mixed hyperlipidemia: Secondary | ICD-10-CM | POA: Diagnosis not present

## 2014-12-04 DIAGNOSIS — Z87440 Personal history of urinary (tract) infections: Secondary | ICD-10-CM | POA: Diagnosis not present

## 2014-12-04 DIAGNOSIS — I10 Essential (primary) hypertension: Secondary | ICD-10-CM | POA: Diagnosis not present

## 2014-12-04 DIAGNOSIS — C50411 Malignant neoplasm of upper-outer quadrant of right female breast: Secondary | ICD-10-CM | POA: Diagnosis not present

## 2014-12-04 DIAGNOSIS — Z9181 History of falling: Secondary | ICD-10-CM | POA: Diagnosis not present

## 2014-12-23 DIAGNOSIS — E538 Deficiency of other specified B group vitamins: Secondary | ICD-10-CM | POA: Diagnosis not present

## 2014-12-23 DIAGNOSIS — Z0000 Encounter for general adult medical examination without abnormal findings: Secondary | ICD-10-CM | POA: Diagnosis not present

## 2015-02-03 ENCOUNTER — Telehealth: Payer: Self-pay | Admitting: Diagnostic Neuroimaging

## 2015-02-03 NOTE — Telephone Encounter (Signed)
Patients husband called and requested a refill on Rx. donepezil (ARICEPT) 5 MG tablet. PC&A.

## 2015-02-03 NOTE — Telephone Encounter (Signed)
It appears at [place] in Nov patient was started on Aricept 5mg  for one month, then was to transition to 10 mg thereafter.  A one year Rx was sent at that time.  I called back.  Spoke with Shelly Willis.  Inquired if they have changed pharmacies, but he says they have not.  Says he lost the Rx bottle, and called [place] because he does not know the Rx number.  Explained unfortunately, we do not have access to that information, but if he speaks to the pharmacy staff, they should be able to help obtain this info.  He will contact the pharmacy and call us back if anything further is needed.

## 2015-02-07 ENCOUNTER — Other Ambulatory Visit: Payer: Self-pay | Admitting: Urology

## 2015-02-07 ENCOUNTER — Ambulatory Visit (INDEPENDENT_AMBULATORY_CARE_PROVIDER_SITE_OTHER): Payer: Medicare Other | Admitting: Urology

## 2015-02-07 DIAGNOSIS — N210 Calculus in bladder: Secondary | ICD-10-CM

## 2015-02-07 DIAGNOSIS — N3941 Urge incontinence: Secondary | ICD-10-CM | POA: Diagnosis not present

## 2015-02-07 DIAGNOSIS — T839XXA Unspecified complication of genitourinary prosthetic device, implant and graft, initial encounter: Secondary | ICD-10-CM

## 2015-02-07 DIAGNOSIS — N390 Urinary tract infection, site not specified: Secondary | ICD-10-CM

## 2015-02-07 DIAGNOSIS — R310 Gross hematuria: Secondary | ICD-10-CM | POA: Diagnosis not present

## 2015-02-12 ENCOUNTER — Telehealth: Payer: Self-pay | Admitting: *Deleted

## 2015-02-12 ENCOUNTER — Other Ambulatory Visit: Payer: Self-pay | Admitting: *Deleted

## 2015-02-12 ENCOUNTER — Ambulatory Visit (HOSPITAL_COMMUNITY)
Admission: RE | Admit: 2015-02-12 | Discharge: 2015-02-12 | Disposition: A | Discharge status: Home / Self Care | Payer: Medicare Other | Source: Ambulatory Visit | Attending: Urology | Admitting: Urology

## 2015-02-12 DIAGNOSIS — N134 Hydroureter: Secondary | ICD-10-CM | POA: Diagnosis not present

## 2015-02-12 DIAGNOSIS — R319 Hematuria, unspecified: Secondary | ICD-10-CM | POA: Insufficient documentation

## 2015-02-12 DIAGNOSIS — N1330 Unspecified hydronephrosis: Secondary | ICD-10-CM | POA: Diagnosis not present

## 2015-02-12 DIAGNOSIS — N210 Calculus in bladder: Secondary | ICD-10-CM | POA: Insufficient documentation

## 2015-02-12 DIAGNOSIS — Z87891 Personal history of nicotine dependence: Secondary | ICD-10-CM | POA: Diagnosis not present

## 2015-02-12 DIAGNOSIS — C50411 Malignant neoplasm of upper-outer quadrant of right female breast: Secondary | ICD-10-CM

## 2015-02-12 DIAGNOSIS — Z853 Personal history of malignant neoplasm of breast: Secondary | ICD-10-CM | POA: Diagnosis not present

## 2015-02-12 NOTE — Telephone Encounter (Signed)
Patient's spouse called reporting "Shelly Willis has a UTI and needs to cancel tomorrow's appointment. Urgency and can't hold it.  Started antibiotic today."  Asked if she can wear a pad or Depends and bring extra to change as moving the appointment might be farther out than they'd like.  "We'll play it by ear and call if she can't come in."

## 2015-02-13 ENCOUNTER — Ambulatory Visit: Payer: Medicare Other | Admitting: Oncology

## 2015-02-13 ENCOUNTER — Telehealth: Payer: Self-pay | Admitting: Oncology

## 2015-02-13 ENCOUNTER — Other Ambulatory Visit: Payer: Medicare Other

## 2015-02-13 NOTE — Progress Notes (Signed)
No how

## 2015-02-13 NOTE — Telephone Encounter (Signed)
Confirmed appointment for August. °

## 2015-03-10 DIAGNOSIS — I10 Essential (primary) hypertension: Secondary | ICD-10-CM | POA: Diagnosis not present

## 2015-03-10 DIAGNOSIS — F0390 Unspecified dementia without behavioral disturbance: Secondary | ICD-10-CM | POA: Diagnosis not present

## 2015-03-11 ENCOUNTER — Inpatient Hospital Stay (HOSPITAL_COMMUNITY): Payer: Medicare Other

## 2015-03-11 ENCOUNTER — Encounter (HOSPITAL_COMMUNITY): Payer: Self-pay | Admitting: Internal Medicine

## 2015-03-11 ENCOUNTER — Inpatient Hospital Stay (HOSPITAL_COMMUNITY)
Admission: AD | Admit: 2015-03-11 | Discharge: 2015-03-14 | DRG: 682 | Disposition: A | Discharge status: Home Health | Payer: Medicare Other | Source: Ambulatory Visit | Attending: Pulmonary Disease | Admitting: Pulmonary Disease

## 2015-03-11 DIAGNOSIS — E860 Dehydration: Secondary | ICD-10-CM | POA: Diagnosis present

## 2015-03-11 DIAGNOSIS — F0390 Unspecified dementia without behavioral disturbance: Secondary | ICD-10-CM | POA: Diagnosis not present

## 2015-03-11 DIAGNOSIS — W19XXXA Unspecified fall, initial encounter: Secondary | ICD-10-CM | POA: Diagnosis present

## 2015-03-11 DIAGNOSIS — E43 Unspecified severe protein-calorie malnutrition: Secondary | ICD-10-CM | POA: Diagnosis not present

## 2015-03-11 DIAGNOSIS — N1330 Unspecified hydronephrosis: Secondary | ICD-10-CM | POA: Diagnosis present

## 2015-03-11 DIAGNOSIS — Z66 Do not resuscitate: Secondary | ICD-10-CM | POA: Diagnosis not present

## 2015-03-11 DIAGNOSIS — C50411 Malignant neoplasm of upper-outer quadrant of right female breast: Secondary | ICD-10-CM | POA: Diagnosis present

## 2015-03-11 DIAGNOSIS — F03C0 Unspecified dementia, severe, without behavioral disturbance, psychotic disturbance, mood disturbance, and anxiety: Secondary | ICD-10-CM | POA: Diagnosis present

## 2015-03-11 DIAGNOSIS — G9340 Encephalopathy, unspecified: Secondary | ICD-10-CM | POA: Diagnosis not present

## 2015-03-11 DIAGNOSIS — Z87891 Personal history of nicotine dependence: Secondary | ICD-10-CM | POA: Diagnosis not present

## 2015-03-11 DIAGNOSIS — E785 Hyperlipidemia, unspecified: Secondary | ICD-10-CM | POA: Diagnosis present

## 2015-03-11 DIAGNOSIS — R531 Weakness: Secondary | ICD-10-CM | POA: Diagnosis not present

## 2015-03-11 DIAGNOSIS — J984 Other disorders of lung: Secondary | ICD-10-CM | POA: Diagnosis not present

## 2015-03-11 DIAGNOSIS — Z9011 Acquired absence of right breast and nipple: Secondary | ICD-10-CM | POA: Diagnosis present

## 2015-03-11 DIAGNOSIS — N179 Acute kidney failure, unspecified: Principal | ICD-10-CM | POA: Diagnosis present

## 2015-03-11 DIAGNOSIS — Z823 Family history of stroke: Secondary | ICD-10-CM | POA: Diagnosis not present

## 2015-03-11 DIAGNOSIS — R4189 Other symptoms and signs involving cognitive functions and awareness: Secondary | ICD-10-CM

## 2015-03-11 DIAGNOSIS — R627 Adult failure to thrive: Secondary | ICD-10-CM | POA: Diagnosis not present

## 2015-03-11 DIAGNOSIS — Z6820 Body mass index (BMI) 20.0-20.9, adult: Secondary | ICD-10-CM | POA: Diagnosis not present

## 2015-03-11 HISTORY — DX: Deficiency of other specified B group vitamins: E53.8

## 2015-03-11 HISTORY — DX: Unspecified hydronephrosis: N13.30

## 2015-03-11 LAB — CBC
HCT: 30.3 % — ABNORMAL LOW (ref 36.0–46.0)
Hemoglobin: 9.9 g/dL — ABNORMAL LOW (ref 12.0–15.0)
MCH: 32.6 pg (ref 26.0–34.0)
MCHC: 32.7 g/dL (ref 30.0–36.0)
MCV: 99.7 fL (ref 78.0–100.0)
Platelets: 298 10*3/uL (ref 150–400)
RBC: 3.04 MIL/uL — ABNORMAL LOW (ref 3.87–5.11)
RDW: 17.1 % — ABNORMAL HIGH (ref 11.5–15.5)
WBC: 13.3 10*3/uL — ABNORMAL HIGH (ref 4.0–10.5)

## 2015-03-11 LAB — COMPREHENSIVE METABOLIC PANEL
ALT: 79 U/L — AB (ref 14–54)
AST: 71 U/L — AB (ref 15–41)
Albumin: 3.8 g/dL (ref 3.5–5.0)
Alkaline Phosphatase: 140 U/L — ABNORMAL HIGH (ref 38–126)
Anion gap: 11 (ref 5–15)
BUN: 49 mg/dL — ABNORMAL HIGH (ref 6–20)
CHLORIDE: 102 mmol/L (ref 101–111)
CO2: 25 mmol/L (ref 22–32)
Calcium: 11.2 mg/dL — ABNORMAL HIGH (ref 8.9–10.3)
Creatinine, Ser: 1.82 mg/dL — ABNORMAL HIGH (ref 0.44–1.00)
GFR calc Af Amer: 30 mL/min — ABNORMAL LOW (ref >60)
GFR, EST NON AFRICAN AMERICAN: 26 mL/min — AB (ref >60)
Glucose, Bld: 107 mg/dL — ABNORMAL HIGH (ref 65–99)
POTASSIUM: 4 mmol/L (ref 3.5–5.1)
SODIUM: 138 mmol/L (ref 135–145)
Total Bilirubin: 0.7 mg/dL (ref 0.3–1.2)
Total Protein: 8.2 g/dL — ABNORMAL HIGH (ref 6.5–8.1)

## 2015-03-11 MED ORDER — HYDROCODONE-ACETAMINOPHEN 5-325 MG PO TABS: 1.0000 | ORAL_TABLET | ORAL | Status: DC | PRN

## 2015-03-11 MED ORDER — ENOXAPARIN SODIUM 40 MG/0.4ML ~~LOC~~ SOLN
40.0000 mg | SUBCUTANEOUS | Status: DC
  Administered 2015-03-11 – 2015-03-13 (×3): 40 mg via SUBCUTANEOUS
  Filled 2015-03-11 (×3): qty 0.4

## 2015-03-11 MED ORDER — ONDANSETRON HCL 4 MG PO TABS: 4.0000 mg | ORAL_TABLET | Freq: Four times a day (QID) | ORAL | Status: DC | PRN

## 2015-03-11 MED ORDER — DEXTROSE 5 % IV SOLN
1.0000 g | INTRAVENOUS | Status: DC
  Administered 2015-03-12: 1 g via INTRAVENOUS
  Filled 2015-03-11 (×6): qty 10

## 2015-03-11 MED ORDER — MAGNESIUM CITRATE PO SOLN: 1.0000 | Freq: Once | ORAL | Status: DC | PRN

## 2015-03-11 MED ORDER — MAGNESIUM HYDROXIDE 400 MG/5ML PO SUSP: 30.0000 mL | Freq: Every day | ORAL | Status: DC | PRN

## 2015-03-11 MED ORDER — POLYETHYLENE GLYCOL 3350 17 G PO PACK
17.0000 g | PACK | Freq: Two times a day (BID) | ORAL | Status: DC
  Administered 2015-03-11 – 2015-03-14 (×6): 17 g via ORAL
  Filled 2015-03-11 (×6): qty 1

## 2015-03-11 MED ORDER — SODIUM CHLORIDE 0.9 % IV SOLN
INTRAVENOUS | Status: DC
  Administered 2015-03-11 – 2015-03-12 (×2): via INTRAVENOUS
  Administered 2015-03-13: 1 mL via INTRAVENOUS

## 2015-03-11 MED ORDER — ACETAMINOPHEN 650 MG RE SUPP: 650.0000 mg | Freq: Four times a day (QID) | RECTAL | Status: DC | PRN

## 2015-03-11 MED ORDER — ACETAMINOPHEN 325 MG PO TABS: 650.0000 mg | ORAL_TABLET | Freq: Four times a day (QID) | ORAL | Status: DC | PRN

## 2015-03-11 MED ORDER — ONDANSETRON HCL 4 MG/2ML IJ SOLN: 4.0000 mg | Freq: Four times a day (QID) | INTRAMUSCULAR | Status: DC | PRN

## 2015-03-11 MED ORDER — ENSURE ENLIVE PO LIQD
237.0000 mL | Freq: Three times a day (TID) | ORAL | Status: DC
  Administered 2015-03-11 – 2015-03-14 (×8): 237 mL via ORAL

## 2015-03-11 MED ORDER — LETROZOLE 2.5 MG PO TABS
2.5000 mg | ORAL_TABLET | Freq: Every day | ORAL | Status: DC
  Administered 2015-03-12 – 2015-03-14 (×3): 2.5 mg via ORAL
  Filled 2015-03-11 (×7): qty 1

## 2015-03-11 MED ORDER — DONEPEZIL HCL 5 MG PO TABS
5.0000 mg | ORAL_TABLET | Freq: Every day | ORAL | Status: DC
  Administered 2015-03-11 – 2015-03-13 (×3): 5 mg via ORAL
  Filled 2015-03-11 (×3): qty 1

## 2015-03-11 MED ORDER — ALUM & MAG HYDROXIDE-SIMETH 200-200-20 MG/5ML PO SUSP: 30.0000 mL | Freq: Four times a day (QID) | ORAL | Status: DC | PRN

## 2015-03-11 NOTE — Progress Notes (Signed)
ANTIBIOTIC CONSULT NOTE - INITIAL  Pharmacy Consult for Ceftriaxone Indication: UTI  Allergies  Allergen Reactions  . Vesicare [Solifenacin Succinate] Other (See Comments)    Headaches and upset stomach     Patient Measurements: Weight: 122 lb 1.6 oz (55.384 kg) Adjusted Body Weight:   Vital Signs: Temp: 98.5 F (36.9 C) (07/12 1527) Temp Source: Oral (07/12 1527) BP: 122/64 mmHg (07/12 1527) Pulse Rate: 96 (07/12 1527) Intake/Output from previous day:   Intake/Output from this shift:    Labs: No results for input(s): WBC, HGB, PLT, LABCREA, CREATININE in the last 72 hours. CrCl cannot be calculated (Patient has no serum creatinine result on file.). No results for input(s): VANCOTROUGH, VANCOPEAK, VANCORANDOM, GENTTROUGH, GENTPEAK, GENTRANDOM, TOBRATROUGH, TOBRAPEAK, TOBRARND, AMIKACINPEAK, AMIKACINTROU, AMIKACIN in the last 72 hours.   Microbiology: No results found for this or any previous visit (from the past 720 hour(s)).  Medical History: Past Medical History  Diagnosis Date  . Cataract   . Hyperlipidemia   . History of neck surgery cervical laminectomy  . Urethral polyp removed AUG 2010  . Stress incontinence, female   . Migraines   . PONV (postoperative nausea and vomiting)   . H/O exercise stress test     done in PCP- office, maybe 15 yrs. ago   . Cancer     right breast  . Aromatase inhibitor use Nov. 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  Planned for "minimum of 5 years"  . Breast cancer 06/16/11    Right Breast   . Generalized headaches   . Hydronephrosis   . B12 deficiency     Medications:  Prescriptions prior to admission  Medication Sig Dispense Refill Last Dose  . acetaminophen (TYLENOL) 325 MG tablet Take 2 tablets (650 mg total) by mouth every 6 (six) hours as needed for mild pain (or Fever >/= 101).     Marland Kitchen amLODipine (NORVASC) 5 MG tablet Take 1  tablet (5 mg total) by mouth daily.     . calcium-vitamin D (OSCAL WITH D) 500-200 MG-UNIT per tablet Take 1 tablet by mouth daily with breakfast.   10/11/2014  . cefUROXime (CEFTIN) 250 MG tablet Take 1 tablet (250 mg total) by mouth 2 (two) times daily with a meal. 6 tablet    . cyanocobalamin (,VITAMIN B-12,) 1000 MCG/ML injection Inject 1,000 mcg into the muscle every 30 (thirty) days.    Past Month  . donepezil (ARICEPT) 5 MG tablet Take 1 tablet (5 mg total) by mouth at bedtime. 30 tablet 0 10/11/2014  . feeding supplement, ENSURE COMPLETE, (ENSURE COMPLETE) LIQD Take 237 mLs by mouth 2 (two) times daily between meals.     Marland Kitchen letrozole (FEMARA) 2.5 MG tablet Take 1 tablet (2.5 mg total) by mouth daily. 60 tablet 5 10/11/2014  . losartan (COZAAR) 100 MG tablet Take 100 mg by mouth every morning.    10/11/2014  . ondansetron (ZOFRAN) 4 MG tablet Take 1 tablet (4 mg total) by mouth every 6 (six) hours as needed for nausea. 20 tablet 0   . [DISCONTINUED] enoxaparin (LOVENOX) 40 MG/0.4ML injection Inject 0.4 mLs (40 mg total) into the skin daily. For 10 days 0 Syringe     Assessment: Acute encephalopathy: Likely related to urinary tract infection and mild dehydration and patient with a history of hydronephrosis status post stent 2011 with frequent UTI. Ceftriaxone empirically per pharmacy consult Renal ultrasound, depending on results  might request urology consult.  Goal of Therapy:  Eradicate infection  Plan:  Ceftriaxone 1 GM IV every 24 hours Labs per protocol F/U urinalysis   Chriss Czar 03/11/2015,4:43 PM

## 2015-03-11 NOTE — Progress Notes (Signed)
Initial Nutrition Assessment  DOCUMENTATION CODES:  Severe malnutrition in context of chronic illness  INTERVENTION:  Ensure Enlive po TID, each supplement provides 350 kcal and 20 grams of protein  Recommend MVI with minerals.   NUTRITION DIAGNOSIS:  Inadequate oral intake related to lethargy/confusion/complete lack of appetite as evidenced by 18% bw in the past 7 months  GOAL:  Patient will meet greater than or equal to 90% of their needs  MONITOR:  PO intake, Labs, I & O's, Supplement acceptance, plan of care  REASON FOR ASSESSMENT:  Malnutrition Screening Tool    ASSESSMENT:  74 y.o. female PMhx:  Hyperlipidemia, breast cancer, persistent UTI hydronephrosis, urethral polyp , Comes from home as directed by PCP for dehydration and urinary tract infection. Husband reports decline x3 months. Has had poor PO intake and confusion in last 3 weeks.   Spoke with husband. He states that pt was initially doing well after her time at a snf in February. However, it appears that her weight had already declined significantly at that time and has just continued to decline over the past few months.   He reports that she "has not eaten a full meal in 2 weeks". She will just eat bites of each meal that he makes her. He reports that the pt will request a meal, but is not hungry when he goes to feed her. He says the only thing she likes is Ensure/Boost which she drinks 4 of each day. He denies the pt having any n/v or other GI distress, just lack of appetite.   Physical assessment was difficult as patient was still in street clothes, Definitely some mild-moderate temporal wasting.     Diet Order:  Diet Heart Room service appropriate?: No; Fluid consistency:: Thin  Skin:  Reviewed, no issues  Last BM: Unknown  Height:  Ht Readings from Last 1 Encounters:  10/13/14 5\' 5"  (1.651 m)    Weight:  Wt Readings from Last 1 Encounters:  03/11/15 122 lb 1.6 oz (55.384 kg)    Ideal Body Weight:   56.8 kg  Wt Readings from Last 10 Encounters:  03/11/15 122 lb 1.6 oz (55.384 kg)  10/13/14 129 lb 3.2 oz (58.605 kg)  07/30/14 149 lb (67.586 kg)  07/23/14 148 lb 9.6 oz (67.405 kg)  07/02/14 150 lb 8 oz (68.266 kg)  06/13/14 156 lb (70.761 kg)  05/23/14 157 lb (71.215 kg)  02/06/14 158 lb 9.6 oz (71.94 kg)  10/22/13 159 lb (72.122 kg)  06/21/13 165 lb (74.844 kg)  Appears to have lost ~7 lbs in the past 5 months  BMI:  Body mass index is 20.32 kg/(m^2).  Estimated Nutritional Needs:  Kcal:  1600-1750 (29-32kcal/kg) Protein:  63-74 g (1.1-1.3 g/kg) Fluid:  1.6-1.8 liters  EDUCATION NEEDS:   No education needs identified at this time  Burtis Junes RD, LDN Nutrition Pager: [phone] 03/11/2015 5:50 PM

## 2015-03-11 NOTE — H&P (Signed)
[facility] History and Physical  Shelly Willis QVZ:[id number] DOB: 11/24/1940 DOA: 03/11/2015  Referring physician:  PCP: Alonza Bogus, MD   Chief Complaint: Generalized weakness gradual decline in functionality altered mental status  HPI: Shelly Willis is a 74 y.o. female past medical history fluids hyperlipidemia breast cancer persistent UTI hydronephrosis, urethral polyp since to room 338 from home as directed by primary care provider for dehydration and urinary tract infection. Information obtained from husband who is at the bedside. He states patient has been a gradual decline over the last 3 months. Over the last 3 weeks she has had decreased oral intake more frequent incontinence and increased confusion. She went to her primary care provider yesterday and blood work was drawn. Husband reports primary care provider informed him today that she was dehydrated and had an infection and she needed to come to the hospital. Husband denies any fever chills vomiting diarrhea. Does report generalized weakness worsening over the last 2 days to the point he was unable to assist her to the bathroom. He reports mechanical fall 4 days ago without injury. There've been no complaints of chest pain palpitation. No coughing. Husband states patient is sleeping more than usual. He also reports recent history of urinary tract infection in February for which she was hospitalized 3 days. After that went to [facility] for 2 weeks. Husband reports she "did well" until about 2 months ago when he noticed a gradual decline. Also of note patient being followed by urology according to husband status post stent in 2011. Husband reports being told one of her kidneys was "enlarged" after CT renal stone study done in June   Review of Systems:  10 point review of systems complete with husband also systems are negative except as indicated in the history of present illness.   Past Medical History  Diagnosis  Date  . Cataract   . Hyperlipidemia   . History of neck surgery cervical laminectomy  . Urethral polyp removed AUG 2010  . Stress incontinence, female   . Migraines   . PONV (postoperative nausea and vomiting)   . H/O exercise stress test     done in PCP- office, maybe 15 yrs. ago   . Cancer     right breast  . Aromatase inhibitor use Nov. 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  Planned for "minimum of 5 years"  . Breast cancer 06/16/11    Right Breast   . Generalized headaches   . Hydronephrosis    Past Surgical History  Procedure Laterality Date  . Cholecystectomy  1993  . Tubal ligation  1977  . Bladder surgery  2011    growth attached to bladder stem   . Bilateral salpingoophorectomy    . Ruptured disc surgery 1988  1988  . Kidney stones    . Modified mastectomy  12/28/2011    Procedure: MODIFIED MASTECTOMY;  Surgeon: Pedro Earls, MD;  Location: [place];  Service: General;  Laterality: Right;  Right modified mastectomy/   . Breast surgery  12/01/2011    Right breast lumpectomy  . Needle core biopsy  11/02/11    Left Breast Needle Core Biopsy, 9'Oclocl, No Malignancy: Biospy Axilla - Mammary Carcinoma, ER/PR Positive, Low Ki67  . Needle core biopsy  06/16/11    Right Breast - Invasive Mammary with Lobular features  . Abdominal hysterectomy  1997  complete   Social History:  reports that she quit smoking about 27 years ago. Her smoking use included Cigarettes. She smoked 1.00 pack per day. She has never used smokeless tobacco. She reports that she drinks about 0.6 oz of alcohol per week. She reports that she does not use illicit drugs. She lives at home with her husband and has done so for the last 50 years she quit smoking about 30 years ago she is a retired Radiation protection practitioner. Allergies  Allergen Reactions  . Vesicare [Solifenacin Succinate] Other (See Comments)     Headaches and upset stomach     Family History  Problem Relation Age of Onset  . Cancer Neg Hx   . Stroke Mother     in mid 78's   unable to review family medical history due to altered mental status  Prior to Admission medications   Medication Sig Start Date End Date Taking? Authorizing Provider  acetaminophen (TYLENOL) 325 MG tablet Take 2 tablets (650 mg total) by mouth every 6 (six) hours as needed for mild pain (or Fever >/= 101). 10/16/14   Sinda Du, MD  amLODipine (NORVASC) 5 MG tablet Take 1 tablet (5 mg total) by mouth daily. 10/16/14   Sinda Du, MD  calcium-vitamin D (OSCAL WITH D) 500-200 MG-UNIT per tablet Take 1 tablet by mouth daily with breakfast.    Historical Provider, MD  cefUROXime (CEFTIN) 250 MG tablet Take 1 tablet (250 mg total) by mouth 2 (two) times daily with a meal. 10/16/14   Sinda Du, MD  cyanocobalamin (,VITAMIN B-12,) 1000 MCG/ML injection Inject 1,000 mcg into the muscle every 30 (thirty) days.     Historical Provider, MD  donepezil (ARICEPT) 5 MG tablet Take 1 tablet (5 mg total) by mouth at bedtime. 07/23/14   Penni Bombard, MD  feeding supplement, ENSURE COMPLETE, (ENSURE COMPLETE) LIQD Take 237 mLs by mouth 2 (two) times daily between meals. 10/16/14   Sinda Du, MD  letrozole [facility]) 2.5 MG tablet Take 1 tablet (2.5 mg total) by mouth daily. 05/07/14   Chauncey Cruel, MD  losartan (COZAAR) 100 MG tablet Take 100 mg by mouth every morning.     Historical Provider, MD  ondansetron (ZOFRAN) 4 MG tablet Take 1 tablet (4 mg total) by mouth every 6 (six) hours as needed for nausea. 10/16/14   Sinda Du, MD   Physical Exam: Filed Vitals:   03/11/15 1516 03/11/15 1527  BP:  122/64  Pulse:  96  Temp:  98.5 F (36.9 C)  TempSrc:  Oral  Weight: 55.384 kg (122 lb 1.6 oz)     Wt Readings from Last 3 Encounters:  03/11/15 55.384 kg (122 lb 1.6 oz)  10/13/14 58.605 kg (129 lb 3.2 oz)  07/30/14 67.586 kg (149 lb)    General:   Appears calm and comfortable, somewhat pale and frail Eyes: PERRL, normal lids, irises & conjunctiva ENT: grossly normal hearing, his membranes of her mouth are pink but dry Neck: no LAD, masses or thyromegaly Cardiovascular: RRR, no m/r/g. No LE edema. Pedal pulses present and palpable Respiratory: CTA bilaterally, no w/r/r. Normal respiratory effort. Abdomen: soft, ntnd positive bowel sounds nontender to palpation Skin: no rash or induration seen on limited exam Musculoskeletal: grossly normal tone BUE/BLE Psychiatric: grossly normal mood and affect, speech fluent and appropriate Neurologic: Alert and attempts to answer questions and follow commands but is unable           Labs on Admission:  Basic Metabolic Panel:  No results for input(s): NA, K, CL, CO2, GLUCOSE, BUN, CREATININE, CALCIUM, MG, PHOS in the last 168 hours. Liver Function Tests: No results for input(s): AST, ALT, ALKPHOS, BILITOT, PROT, ALBUMIN in the last 168 hours. No results for input(s): LIPASE, AMYLASE in the last 168 hours. No results for input(s): AMMONIA in the last 168 hours. CBC: No results for input(s): WBC, NEUTROABS, HGB, HCT, MCV, PLT in the last 168 hours. Cardiac Enzymes: No results for input(s): CKTOTAL, CKMB, CKMBINDEX, TROPONINI in the last 168 hours.  BNP (last 3 results) No results for input(s): BNP in the last 8760 hours.  ProBNP (last 3 results) No results for input(s): PROBNP in the last 8760 hours.  CBG: No results for input(s): GLUCAP in the last 168 hours.  Radiological Exams on Admission: No results found.  EKG:   Assessment/Plan Principal Problem:   Acute encephalopathy: Likely related to urinary tract infection and mild dehydration and patient with a history of hydronephrosis status post stent 2011 with frequent UTI. On exam patient appears  somewhat clinically dry. Will admit to MedSurg. Will obtain a CBC complete metabolic panel urinalysis chest x-ray. Will provide gentle IV  fluids and Rocephin empirically. Also request a renal ultrasound and depending on results may request urology consult Active Problems:  Generalized weakness; husband reports a gradual decline over the last 2 months however the last 2 weeks worsening with decreased oral intake. Likely related to underlying dementia in the setting of UTI and dehydration. Will request PT evaluation tomorrow.  Hydronephrosis: Patient has CT renal stone study done in June revealing mild RIGHT hydronephrosis and minimal RIGHT hydroureter without ureteral calculus.Markedly abnormal appearance to urinary bladder, contracted with thickened wall and surrounding inflammatory changes favoring cystitis/infection though tumor not completely excluded with this setting. Will obtain urinalysis. Will provide empiric rocephin awaiting urinalysis    Hyperlipidemia: lipid panel    Cognitive decline: gradual decline over 2 months increased assistance with feeds, toileting, unable to make needs and wants known. Will check B12 folate RPR    Breast cancer of upper-outer quadrant of right female breast: 2012. Home meds include Femara has taken for 4 years.     Code Status: full DVT Prophylaxis: Family Communication: husband at bedside Disposition Plan: may need snf at discharge  Time spent: 37 minutes  [facility] Pager [phone]

## 2015-03-12 ENCOUNTER — Inpatient Hospital Stay (HOSPITAL_COMMUNITY): Payer: Medicare Other

## 2015-03-12 ENCOUNTER — Encounter (HOSPITAL_COMMUNITY): Payer: Self-pay

## 2015-03-12 DIAGNOSIS — E43 Unspecified severe protein-calorie malnutrition: Secondary | ICD-10-CM | POA: Diagnosis present

## 2015-03-12 NOTE — Progress Notes (Signed)
Consult: Right hydronephrosis Requested by: Dr. Luan Pulling  I reviewed the patient's history and physical, labs and recent renal ultrasounds and CT scan. I reviewed her prior consult and note from her office visit with Dr. Jeffie Pollock last month.  She had hydroureteronephrosis on that CT scan and observation was continued.  I discussed with Dr. Luan Pulling who reports patient has severe dementia, failure to thrive and dehydration.  I do not believe she needs urgent intervention from a urologic point of view with cysto/stent or nephrostomy tube and would recommend continued supportive medical care. She has follow-up in our office next week with Dr. Matilde Sprang (7/19, 3:00 pm) for the urethral mesh erosion/bladder stone/urethral stone. She has never been urinary retention (pvr's and imaging of bladder have been normal).

## 2015-03-12 NOTE — Clinical Social Work Note (Addendum)
Clinical Social Work Assessment  Patient Details  Name: Shelly Willis MRN: [id number] Date of Birth: 1941-07-29  Date of referral:  03/12/15               Reason for consult:  Facility Placement                Permission sought to share information with:    Permission granted to share information::     Name::        Agency::     Relationship::     Contact Information:     Housing/Transportation Living arrangements for the past 2 months:  Single Family Home Source of Information:  Spouse Patient Interpreter Needed:  None Criminal Activity/Legal Involvement Pertinent to Current Situation/Hospitalization:  No - Comment as needed Significant Relationships:  Adult Children, Spouse Lives with:  Spouse Do you feel safe going back to the place where you live?  Yes Need for family participation in patient care:  Yes (Comment)  Care giving concerns: Patient's husband, Shelly Willis, assist in patient's care giving responsibilities.   Social Worker assessment / plan:  CSW met with patient's husband, Shelly Willis, and discussed the purpose of the CSW referral was the option of SNF due to patient's current status.  Mr. Thaker advised that he has been providing care to patient. He advised that at baseline patient ambulates unassisted and can complete her ADL's with minimal assistance.  He stated that patient has not been at her baseline for the past week and a half.  He stated that over the past week and a half patient has not been walking, had limited food intake, and been minimally verbal.  Mr. Jamerson advised that he has also been assisting her with bathing over the past week and a half.  He advised that he was not interested in SNF at this time as he wanted to  Continue to provide care for patient as he was able.  Mr. Winberg advised that patient had been at [facility] in the past for 14 days for rehab.  He advised that patient had also had home health through [facility] in the past.  Mr.  Pulse advised that he would like to have patient to come home at discharge and have home health services in the home.   CSW signing off.  Employment status:  Retired Nurse, adult PT Recommendations:  Not assessed at this time Information / Referral to community resources:     Patient/Family's Response to care:  Patient's husband, who is her care giver desires for patient to return home with home health services at this time.  Patient/Family's Understanding of and Emotional Response to Diagnosis, Current Treatment, and Prognosis:  Mr. Finchum is aware that due to patient's diagnosis, treatment and prognosis that patient may need SNF care at some point, however he would like to keep her home and provide care to her as long as he is able to.   Emotional Assessment Appearance:  Developmentally appropriate Attitude/Demeanor/Rapport:  Unable to Assess Affect (typically observed):  Unable to Assess Orientation:    Alcohol / Substance use:  Not Applicable Psych involvement (Current and /or in the community):     Discharge Needs  Concerns to be addressed:  Other (Comment Required [facility]) Readmission within the last 30 days:  No Current discharge risk:  None Barriers to Discharge:  No Barriers Identified   Ihor Gully, LCSW 03/12/2015, 11:31 AM [phone]

## 2015-03-12 NOTE — Progress Notes (Signed)
PT Cancellation Note  Patient Details Name: Shelly Willis MRN: [id number] DOB: 08-07-1941   Cancelled Treatment:    Reason Eval/Treat Not Completed: Fatigue/lethargy limiting ability to participate;Patient declined, no reason specified. Pt resting and calm; uninterested in participating c mobility assessment, despite multiple visual and tactile cues from PT and/or husband. Pt responds "no, I don't want to" but is unable to further justify due to language deficits. Will attempt at later date/time.    Jadasia Haws C 03/12/2015, 12:19 PM 12:21 PM  Etta Grandchild, PT, DPT [place] License # [id number]

## 2015-03-12 NOTE — Progress Notes (Signed)
Subjective: She was admitted yesterday with dehydration and acute kidney injury and failure to thrive.  Objective: Vital signs in last 24 hours: Temp:  [98.4 F (36.9 C)-98.5 F (36.9 C)] 98.4 F (36.9 C) (07/13 0700) Pulse Rate:  [96-112] 112 (07/13 0700) Resp:  [20] 20 (07/13 0700) BP: (109-130)/(64-85) 109/85 mmHg (07/13 0700) SpO2:  [99 %-100 %] 100 % (07/13 0700) Weight:  [55.384 kg (122 lb 1.6 oz)] 55.384 kg (122 lb 1.6 oz) (07/12 1516) Weight change:  Last BM Date: 03/10/15  Intake/Output from previous day: 07/12 0701 - 07/13 0700 In: 917.5 [I.V.:917.5] Out: -   PHYSICAL EXAM General appearance: alert and no distress Resp: clear to auscultation bilaterally Cardio: regular rate and rhythm, S1, S2 normal, no murmur, click, rub or gallop GI: soft, non-tender; bowel sounds normal; no masses,  no organomegaly Extremities: extremities normal, atraumatic, no cyanosis or edema  Lab Results:  Results for orders placed or performed during the hospital encounter of 03/11/15 (from the past 48 hour(s))  CBC     Status: Abnormal   Collection Time: 03/11/15  5:50 PM  Result Value Ref Range   WBC 13.3 (H) 4.0 - 10.5 K/uL   RBC 3.04 (L) 3.87 - 5.11 MIL/uL   Hemoglobin 9.9 (L) 12.0 - 15.0 g/dL   HCT 30.3 (L) 36.0 - 46.0 %   MCV 99.7 78.0 - 100.0 fL   MCH 32.6 26.0 - 34.0 pg   MCHC 32.7 30.0 - 36.0 g/dL   RDW 17.1 (H) 11.5 - 15.5 %   Platelets 298 150 - 400 K/uL  Comprehensive metabolic panel     Status: Abnormal   Collection Time: 03/11/15  5:50 PM  Result Value Ref Range   Sodium 138 135 - 145 mmol/L   Potassium 4.0 3.5 - 5.1 mmol/L   Chloride 102 101 - 111 mmol/L   CO2 25 22 - 32 mmol/L   Glucose, Bld 107 (H) 65 - 99 mg/dL   BUN 49 (H) 6 - 20 mg/dL   Creatinine, Ser 1.82 (H) 0.44 - 1.00 mg/dL   Calcium 11.2 (H) 8.9 - 10.3 mg/dL   Total Protein 8.2 (H) 6.5 - 8.1 g/dL   Albumin 3.8 3.5 - 5.0 g/dL   AST 71 (H) 15 - 41 U/L   ALT 79 (H) 14 - 54 U/L   Alkaline Phosphatase  140 (H) 38 - 126 U/L   Total Bilirubin 0.7 0.3 - 1.2 mg/dL   GFR calc non Af Amer 26 (L) >60 mL/min   GFR calc Af Amer 30 (L) >60 mL/min    Comment: (NOTE) The eGFR has been calculated using the CKD EPI equation. This calculation has not been validated in all clinical situations. eGFR's persistently <60 mL/min signify possible Chronic Kidney Disease.    Anion gap 11 5 - 15    ABGS No results for input(s): PHART, PO2ART, TCO2, HCO3 in the last 72 hours.  Invalid input(s): PCO2 CULTURES No results found for this or any previous visit (from the past 240 hour(s)). Studies/Results: US Renal  03/12/2015   CLINICAL DATA:  Acute encephalopathy. History of nephrolithiasis and breast cancer. Initial encounter.  EXAM: RENAL / URINARY TRACT ULTRASOUND COMPLETE  COMPARISON:  CT 02/12/2015.  Renal ultrasound 10/16/2004)  FINDINGS: Right Kidney:  Length: 9.2 cm. Stable chronic renal cortical thinning. Mild to moderate hydronephrosis is similar to recent studies. No calculi observed.  Left Kidney:  Length: 8.8 cm. Partially obscured by bowel gas. There is stable cortical thinning without  hydronephrosis or focal lesion.  Bladder:  Suboptimally evaluated due to bowel gas and incomplete distension. There is persistent irregular bladder wall thickening, similar to recent CT.  IMPRESSION: 1. Persistent mild to moderate right-sided hydronephrosis of undetermined etiology. 2. Persistent irregular bladder wall thickening as demonstrated on previous CT.   Electronically Signed   By: Richardean Sale M.D.   On: 03/12/2015 08:10   Portable Chest 1 View  03/11/2015   CLINICAL DATA:  Acute encephalopathy, altered mental status  EXAM: PORTABLE CHEST - 1 VIEW  COMPARISON:  01/04/2013  FINDINGS: Stable chronic streaky opacity in the right upper lobe compatible with scarring or post treatment changes. Apical scarring present bilaterally. Normal heart size and vascularity. Previous right mastectomy and axillary lymph node  dissection clips. Lungs otherwise clear. No new collapse, consolidation, edema, effusion, or pneumothorax. Trachea is midline. Degenerative changes of the spine.  IMPRESSION: Chronic right upper lobe scarring versus post treatment changes.  No new superimposed acute process.   Electronically Signed   By: Jerilynn Mages.  Shick M.D.   On: 03/11/2015 17:29    Medications:  Prior to Admission:  Prescriptions prior to admission  Medication Sig Dispense Refill Last Dose  . donepezil (ARICEPT) 5 MG tablet Take 1 tablet (5 mg total) by mouth at bedtime. (Patient taking differently: Take 10 mg by mouth at bedtime. ) 30 tablet 0 10/11/2014  . letrozole (FEMARA) 2.5 MG tablet Take 1 tablet (2.5 mg total) by mouth daily. 60 tablet 5 10/11/2014  . losartan (COZAAR) 100 MG tablet Take 100 mg by mouth every morning.    10/11/2014  . trimethoprim (TRIMPEX) 100 MG tablet Take 100 mg by mouth at bedtime.     Marland Kitchen acetaminophen (TYLENOL) 325 MG tablet Take 2 tablets (650 mg total) by mouth every 6 (six) hours as needed for mild pain (or Fever >/= 101).     Marland Kitchen amLODipine (NORVASC) 5 MG tablet Take 1 tablet (5 mg total) by mouth daily.     . calcium-vitamin D (OSCAL WITH D) 500-200 MG-UNIT per tablet Take 1 tablet by mouth daily with breakfast.   10/11/2014  . cefUROXime (CEFTIN) 250 MG tablet Take 1 tablet (250 mg total) by mouth 2 (two) times daily with a meal. 6 tablet    . cyanocobalamin (,VITAMIN B-12,) 1000 MCG/ML injection Inject 1,000 mcg into the muscle every 30 (thirty) days.    Past Month  . feeding supplement, ENSURE COMPLETE, (ENSURE COMPLETE) LIQD Take 237 mLs by mouth 2 (two) times daily between meals.     . ondansetron (ZOFRAN) 4 MG tablet Take 1 tablet (4 mg total) by mouth every 6 (six) hours as needed for nausea. 20 tablet 0   . [DISCONTINUED] enoxaparin (LOVENOX) 40 MG/0.4ML injection Inject 0.4 mLs (40 mg total) into the skin daily. For 10 days 0 Syringe     Scheduled: . cefTRIAXone (ROCEPHIN)  IV  1 g Intravenous  Q24H  . donepezil  5 mg Oral QHS  . enoxaparin (LOVENOX) injection  40 mg Subcutaneous Q24H  . feeding supplement (ENSURE ENLIVE)  237 mL Oral TID BM  . letrozole  2.5 mg Oral Daily  . polyethylene glycol  17 g Oral BID   Continuous: . sodium chloride 75 mL/hr at 03/12/15 0526   EFE:[id number] **OR** acetaminophen, alum & mag hydroxide-simeth, HYDROcodone-acetaminophen, magnesium hydroxide, ondansetron **OR** ondansetron (ZOFRAN) IV  Assesment: She was admitted with dehydration and failure to thrive and acute kidney injury. She has severe protein calorie malnutrition. She has dementia  at baseline which seems to be worsening with time. She has right hydronephrosis. Principal Problem:   Dehydration Active Problems:   Cognitive decline   Breast cancer of upper-outer quadrant of right female breast   Hydronephrosis   Hyperlipidemia   Generalized weakness   Falls   FTT (failure to thrive) in adult   Protein-calorie malnutrition, severe    Plan: Continue IV fluids etc. and request urology consultation.    LOS: 1 day   Shelly Willis L 03/12/2015, 8:39 AM

## 2015-03-13 DIAGNOSIS — N179 Acute kidney failure, unspecified: Secondary | ICD-10-CM | POA: Diagnosis present

## 2015-03-13 LAB — VITAMIN B12: VITAMIN B 12: 5297 pg/mL — AB (ref 180–914)

## 2015-03-13 LAB — BASIC METABOLIC PANEL
Anion gap: 10 (ref 5–15)
BUN: 31 mg/dL — ABNORMAL HIGH (ref 6–20)
CALCIUM: 10.1 mg/dL (ref 8.9–10.3)
CO2: 24 mmol/L (ref 22–32)
CREATININE: 1.21 mg/dL — AB (ref 0.44–1.00)
Chloride: 102 mmol/L (ref 101–111)
GFR calc Af Amer: 50 mL/min — ABNORMAL LOW (ref >60)
GFR calc non Af Amer: 43 mL/min — ABNORMAL LOW (ref >60)
Glucose, Bld: 101 mg/dL — ABNORMAL HIGH (ref 65–99)
Potassium: 3.6 mmol/L (ref 3.5–5.1)
Sodium: 136 mmol/L (ref 135–145)

## 2015-03-13 LAB — RPR: RPR: NONREACTIVE

## 2015-03-13 NOTE — Progress Notes (Signed)
[place] for Rocephin Indication: UTI  Allergies  Allergen Reactions  . Vesicare [Solifenacin Succinate] Other (See Comments)    Headaches and upset stomach    Patient Measurements: Height:  (Pt unable to tell height) Weight: 122 lb 1.6 oz (55.384 kg) IBW/kg (Calculated) : 54.7  Vital Signs: Temp: 98.3 F (36.8 C) (07/14 0527) Temp Source: Axillary (07/14 0527) BP: 122/73 mmHg (07/14 0527) Pulse Rate: 110 (07/14 0527) Intake/Output from previous day: 07/13 0701 - 07/14 0700 In: 2377.5 [P.O.:480; I.V.:1847.5; IV Piggyback:50] Out: -  Intake/Output from this shift:    Labs:  Recent Labs  03/11/15 1750 03/13/15 0708  WBC 13.3*  --   HGB 9.9*  --   PLT 298  --   CREATININE 1.82* 1.21*   Estimated Creatinine Clearance: 35.2 mL/min (by C-G formula based on Cr of 1.21). No results for input(s): VANCOTROUGH, VANCOPEAK, VANCORANDOM, GENTTROUGH, GENTPEAK, GENTRANDOM, TOBRATROUGH, TOBRAPEAK, TOBRARND, AMIKACINPEAK, AMIKACINTROU, AMIKACIN in the last 72 hours.   Microbiology: No results found for this or any previous visit (from the past 720 hour(s)).  Anti-infectives    Start     Dose/Rate Route Frequency Ordered Stop   03/11/15 1800  cefTRIAXone (ROCEPHIN) 1 g in dextrose 5 % 50 mL IVPB     1 g 100 mL/hr over 30 Minutes Intravenous Every 24 hours 03/11/15 1642       Assessment: Okay for Protocol.  No micro data  Goal of Therapy:  Eradicate infection.   Plan:  Dose stable for age, weight, renal function and indication. No pharmacokinetic monitoring needed. Sign off. Pricilla Larsson, [facility] 03/13/2015 11:22 AM

## 2015-03-13 NOTE — Progress Notes (Signed)
Subjective: She is basically unchanged. She is incontinent of both bladder and bowel. Urology note is seen and appreciated in telephone discussion yesterday also appreciated.  Objective: Vital signs in last 24 hours: Temp:  [98.1 F (36.7 C)-98.3 F (36.8 C)] 98.3 F (36.8 C) (07/14 0527) Pulse Rate:  [101-110] 110 (07/14 0527) Resp:  [20] 20 (07/14 0527) BP: (110-131)/(57-88) 122/73 mmHg (07/14 0527) SpO2:  [100 %] 100 % (07/14 0527) Weight change:  Last BM Date: 03/10/15  Intake/Output from previous day: 07/13 0701 - 07/14 0700 In: 2377.5 [P.O.:480; I.V.:1847.5; IV Piggyback:50] Out: -   PHYSICAL EXAM General appearance: Confused but not agitated now Resp: clear to auscultation bilaterally Cardio: regular rate and rhythm, S1, S2 normal, no murmur, click, rub or gallop GI: soft, non-tender; bowel sounds normal; no masses,  no organomegaly Extremities: extremities normal, atraumatic, no cyanosis or edema  Lab Results:  Results for orders placed or performed during the hospital encounter of 03/11/15 (from the past 48 hour(s))  CBC     Status: Abnormal   Collection Time: 03/11/15  5:50 PM  Result Value Ref Range   WBC 13.3 (H) 4.0 - 10.5 K/uL   RBC 3.04 (L) 3.87 - 5.11 MIL/uL   Hemoglobin 9.9 (L) 12.0 - 15.0 g/dL   HCT 30.3 (L) 36.0 - 46.0 %   MCV 99.7 78.0 - 100.0 fL   MCH 32.6 26.0 - 34.0 pg   MCHC 32.7 30.0 - 36.0 g/dL   RDW 17.1 (H) 11.5 - 15.5 %   Platelets 298 150 - 400 K/uL  Comprehensive metabolic panel     Status: Abnormal   Collection Time: 03/11/15  5:50 PM  Result Value Ref Range   Sodium 138 135 - 145 mmol/L   Potassium 4.0 3.5 - 5.1 mmol/L   Chloride 102 101 - 111 mmol/L   CO2 25 22 - 32 mmol/L   Glucose, Bld 107 (H) 65 - 99 mg/dL   BUN 49 (H) 6 - 20 mg/dL   Creatinine, Ser 1.82 (H) 0.44 - 1.00 mg/dL   Calcium 11.2 (H) 8.9 - 10.3 mg/dL   Total Protein 8.2 (H) 6.5 - 8.1 g/dL   Albumin 3.8 3.5 - 5.0 g/dL   AST 71 (H) 15 - 41 U/L   ALT 79 (H) 14 - 54  U/L   Alkaline Phosphatase 140 (H) 38 - 126 U/L   Total Bilirubin 0.7 0.3 - 1.2 mg/dL   GFR calc non Af Amer 26 (L) >60 mL/min   GFR calc Af Amer 30 (L) >60 mL/min    Comment: (NOTE) The eGFR has been calculated using the CKD EPI equation. This calculation has not been validated in all clinical situations. eGFR's persistently <60 mL/min signify possible Chronic Kidney Disease.    Anion gap 11 5 - 15  Vitamin B12     Status: Abnormal   Collection Time: 03/12/15  6:52 AM  Result Value Ref Range   Vitamin B-12 5297 (H) 180 - 914 pg/mL    Comment: (NOTE) This assay is not validated for testing neonatal or myeloproliferative syndrome specimens for Vitamin B12 levels. Performed at [facility]   RPR     Status: None   Collection Time: 03/12/15  6:52 AM  Result Value Ref Range   RPR Ser Ql Non Reactive Non Reactive    Comment: (NOTE) Performed At: [facility] [address] [place], [place] [id number] Lindon Romp MD OQ:[id number]   Basic metabolic panel  Status: Abnormal   Collection Time: 03/13/15  7:08 AM  Result Value Ref Range   Sodium 136 135 - 145 mmol/L   Potassium 3.6 3.5 - 5.1 mmol/L   Chloride 102 101 - 111 mmol/L   CO2 24 22 - 32 mmol/L   Glucose, Bld 101 (H) 65 - 99 mg/dL   BUN 31 (H) 6 - 20 mg/dL   Creatinine, Ser 1.21 (H) 0.44 - 1.00 mg/dL   Calcium 10.1 8.9 - 10.3 mg/dL   GFR calc non Af Amer 43 (L) >60 mL/min   GFR calc Af Amer 50 (L) >60 mL/min    Comment: (NOTE) The eGFR has been calculated using the CKD EPI equation. This calculation has not been validated in all clinical situations. eGFR's persistently <60 mL/min signify possible Chronic Kidney Disease.    Anion gap 10 5 - 15    ABGS No results for input(s): PHART, PO2ART, TCO2, HCO3 in the last 72 hours.  Invalid input(s): PCO2 CULTURES No results found for this or any previous visit (from the past 240 hour(s)). Studies/Results: US Renal  03/12/2015   CLINICAL DATA:   Acute encephalopathy. History of nephrolithiasis and breast cancer. Initial encounter.  EXAM: RENAL / URINARY TRACT ULTRASOUND COMPLETE  COMPARISON:  CT 02/12/2015.  Renal ultrasound 10/16/2004)  FINDINGS: Right Kidney:  Length: 9.2 cm. Stable chronic renal cortical thinning. Mild to moderate hydronephrosis is similar to recent studies. No calculi observed.  Left Kidney:  Length: 8.8 cm. Partially obscured by bowel gas. There is stable cortical thinning without hydronephrosis or focal lesion.  Bladder:  Suboptimally evaluated due to bowel gas and incomplete distension. There is persistent irregular bladder wall thickening, similar to recent CT.  IMPRESSION: 1. Persistent mild to moderate right-sided hydronephrosis of undetermined etiology. 2. Persistent irregular bladder wall thickening as demonstrated on previous CT.   Electronically Signed   By: Richardean Sale M.D.   On: 03/12/2015 08:10   Portable Chest 1 View  03/11/2015   CLINICAL DATA:  Acute encephalopathy, altered mental status  EXAM: PORTABLE CHEST - 1 VIEW  COMPARISON:  01/04/2013  FINDINGS: Stable chronic streaky opacity in the right upper lobe compatible with scarring or post treatment changes. Apical scarring present bilaterally. Normal heart size and vascularity. Previous right mastectomy and axillary lymph node dissection clips. Lungs otherwise clear. No new collapse, consolidation, edema, effusion, or pneumothorax. Trachea is midline. Degenerative changes of the spine.  IMPRESSION: Chronic right upper lobe scarring versus post treatment changes.  No new superimposed acute process.   Electronically Signed   By: Jerilynn Mages.  Shick M.D.   On: 03/11/2015 17:29    Medications:  Prior to Admission:  Prescriptions prior to admission  Medication Sig Dispense Refill Last Dose  . acetaminophen (TYLENOL) 325 MG tablet Take 2 tablets (650 mg total) by mouth every 6 (six) hours as needed for mild pain (or Fever >/= 101).   Unknown  . calcium-vitamin D (OSCAL  WITH D) 500-200 MG-UNIT per tablet Take 1 tablet by mouth daily with breakfast.   Past Week at Unknown time  . cyanocobalamin (,VITAMIN B-12,) 1000 MCG/ML injection Inject 1,000 mcg into the muscle every 30 (thirty) days.    Past Month at Unknown time  . feeding supplement, ENSURE COMPLETE, (ENSURE COMPLETE) LIQD Take 237 mLs by mouth 2 (two) times daily between meals.   03/11/2015 at Unknown time  . letrozole (FEMARA) 2.5 MG tablet Take 1 tablet (2.5 mg total) by mouth daily. 60 tablet 5 03/11/2015 at Unknown  time  . losartan (COZAAR) 100 MG tablet Take 100 mg by mouth every morning.    Past Week at Unknown time  . trimethoprim (TRIMPEX) 100 MG tablet Take 100 mg by mouth at bedtime.   Past Week at Unknown time  . [DISCONTINUED] enoxaparin (LOVENOX) 40 MG/0.4ML injection Inject 0.4 mLs (40 mg total) into the skin daily. For 10 days 0 Syringe     Scheduled: . cefTRIAXone (ROCEPHIN)  IV  1 g Intravenous Q24H  . donepezil  5 mg Oral QHS  . enoxaparin (LOVENOX) injection  40 mg Subcutaneous Q24H  . feeding supplement (ENSURE ENLIVE)  237 mL Oral TID BM  . letrozole  2.5 mg Oral Daily  . polyethylene glycol  17 g Oral BID   Continuous: . sodium chloride 1 mL (03/13/15 0015)   [id number] **OR** acetaminophen, alum & mag hydroxide-simeth, HYDROcodone-acetaminophen, magnesium hydroxide, ondansetron **OR** ondansetron (ZOFRAN) IV  Assesment: She was admitted with dehydration and acute kidney injury. She has improved. She has generalized failure to thrive. At baseline she has dementia and severe protein calorie malnutrition. Principal Problem:   Dehydration Active Problems:   Cognitive decline   Breast cancer of upper-outer quadrant of right female breast   Hydronephrosis   Hyperlipidemia   Generalized weakness   Falls   FTT (failure to thrive) in adult   Protein-calorie malnutrition, severe    Plan: Continue IV fluids. Her husband is hoping to take her back home. Continue other  treatments.    LOS: 2 days   Devarius Nelles L 03/13/2015, 8:47 AM

## 2015-03-13 NOTE — Evaluation (Signed)
Physical Therapy Evaluation Patient Details Name: Shelly Willis MRN: [id number] DOB: 10-08-1940 Today's Date: 03/13/2015   History of Present Illness  74 year old woman with failure to thrive, poor oral intake, generalized weakness, suspected dehydration and overall worsening of condition, subacute decline. Do not get the impression that she has acute encephalopathy at this point. This seems to be a subacute decline from dementia and FTT. Consider UTI and proceeded with workup as above. IV fluids. Check basic blood work and urinalysis. Follow-up renal ultrasound based on previous renal issues.  Clinical Impression   Pt was seen for evaluation.  Per husband's earlier report, pt ambulates with no assistive device (presumable with assistance).  Today, pt is awake but minimally verbal.  She is able to follow simple directions occasionally.  Pt needed min assist to transfer OOB.  She ambulated 70' with mod assist to maintain stance (balance is poor).  She fatigues quickly and gait stability declines with fatigue.  She has a typical dementia gait pattern  With a narrow base of support, shuffle and no pelvic rotation or weight shift.  Although her husband is not present, I believe that he should be able to manage her at home as long as a w/c is accessible to her.  Her dementia is severe and I do not think that PT would be helpful for her.    Follow Up Recommendations No PT follow up    Equipment Recommendations  None recommended by PT    Recommendations for Other Services   none    Precautions / Restrictions Precautions Precautions: Fall Restrictions Weight Bearing Restrictions: No      Mobility  Bed Mobility Overal bed mobility: Needs Assistance Bed Mobility: Supine to Sit     Supine to sit: Min assist;HOB elevated        Transfers Overall transfer level: Needs assistance Equipment used: None Transfers: Sit to/from Stand Sit to Stand: Min assist             Ambulation/Gait Ambulation/Gait assistance: Mod assist Ambulation Distance (Feet): 40 Feet Assistive device: None Gait Pattern/deviations: Trunk flexed;Decreased weight shift to left;Decreased weight shift to right;Decreased dorsiflexion - right;Decreased dorsiflexion - left;Shuffle   Gait velocity interpretation: Below normal speed for age/gender General Gait Details: pt has a typical dementia type of gait pattern with no pelvic rotation or weight shift, extremely short steps  Stairs            Wheelchair Mobility    Modified Rankin (Stroke Patients Only)       Balance Overall balance assessment: Needs assistance Sitting-balance support: No upper extremity supported;Feet supported Sitting balance-Leahy Scale: Fair     Standing balance support: No upper extremity supported Standing balance-Leahy Scale: Poor                               Pertinent Vitals/Pain Pain Assessment: No/denies pain    Home Living Family/patient expects to be discharged to:: Private residence Living Arrangements: Spouse/significant other Available Help at Discharge: Family;Available 24 hours/day Type of Home: House Home Access: Stairs to enter Entrance Stairs-Rails: Right Entrance Stairs-Number of Steps: 4 Home Layout: One level Home Equipment: Cane - single point;Walker - 2 wheels;Wheelchair - manual      Prior Function Level of Independence: Needs assistance   Gait / Transfers Assistance Needed: MinA to supervision; requires physical and/or visual cues  ADL's / Homemaking Assistance Needed: Requires assistance        Hand  Dominance   Dominant Hand: Right    Extremity/Trunk Assessment               Lower Extremity Assessment: Generalized weakness      Cervical / Trunk Assessment: Kyphotic  Communication   Communication: Expressive difficulties (minimally verbal)  Cognition Arousal/Alertness: Awake/alert Behavior During Therapy: Flat affect Overall  Cognitive Status: History of cognitive impairments - at baseline (has advanced dementia)   Orientation Level: Disoriented to;Place;Time;Situation     Following Commands: Follows one step commands inconsistently Safety/Judgement: Decreased awareness of safety;Decreased awareness of deficits   Problem Solving: Slow processing      General Comments      Exercises        Assessment/Plan    PT Assessment Patent does not need any further PT services (dementia is too severe for PT to be helpful)  PT Diagnosis Difficulty walking;Generalized weakness   PT Problem List    PT Treatment Interventions     PT Goals (Current goals can be found in the Care Plan section) Acute Rehab PT Goals PT Goal Formulation: All assessment and education complete, DC therapy    Frequency     Barriers to discharge        Co-evaluation               End of Session Equipment Utilized During Treatment: Gait belt Activity Tolerance: Patient tolerated treatment well Patient left: in bed;with call bell/phone within reach;with bed alarm set Nurse Communication: Mobility status         Time: 1330-1406 PT Time Calculation (min) (ACUTE ONLY): 36 min   Charges:   PT Evaluation $Initial PT Evaluation Tier I: 1 Procedure     PT G CodesDemetrios Isaacs L  PT 03/13/2015, 2:17 PM [phone]

## 2015-03-13 NOTE — Care Management Note (Signed)
Case Management Note  Patient Details  Name: Shelly Willis MRN: [id number] Date of Birth: Nov 24, 1940  Subjective/Objective:                  Pt admitted from home with dehydration and UTi. Pt lives with her husband and will return home at discharge. Pts husband is refusing any placement at SNF at discharge.   Action/Plan: Will arrange [facility] RN and PT at discharge with agency of choice. Will continue to follow for discharge planning needs.  Expected Discharge Date:  03/13/15               Expected Discharge Plan:  [place]  In-House Referral:  NA  Discharge planning Services  CM Consult  Post Acute Care Choice:  Home Health Choice offered to:  Spouse  DME Arranged:    DME Agency:     HH Arranged:  PT HH Agency:     Status of Service:  In process, will continue to follow  Medicare Important Message Given:    Date Medicare IM Given:    Medicare IM give by:    Date Additional Medicare IM Given:    Additional Medicare Important Message give by:     If discussed at [place] of Stay Meetings, dates discussed:    Additional Comments:  Joylene Draft, RN 03/13/2015, 1:41 PM

## 2015-03-13 NOTE — Progress Notes (Addendum)
Incontinent of bladder and bowel.  Keeps hands to perineal area.  Cleaned patient, in/out cathed for ordered urinalysis.  Changed soiled pink foam to sacral area.

## 2015-03-14 MED ORDER — DONEPEZIL HCL 5 MG PO TABS: 10.0000 mg | ORAL_TABLET | Freq: Every day | ORAL | Status: AC

## 2015-03-14 NOTE — Care Management Important Message (Signed)
Important Message  Patient Details  Name: Shelly Willis MRN: [id number] Date of Birth: September 02, 1940   Medicare Important Message Given:  Yes-second notification given    Joylene Draft, RN 03/14/2015, 9:46 AM

## 2015-03-14 NOTE — Discharge Summary (Signed)
Physician Discharge Summary  [name] MRN: [id number] DOB/AGE: [birth year] 74 y.o. Primary Care Physician:Maricarmen Braziel L, MD Admit date: 03/11/2015 Discharge date: 03/14/2015    Discharge Diagnoses:   Principal Problem:   Dehydration Active Problems:   Cognitive decline   Breast cancer of upper-outer quadrant of right female breast   Severe dementia   Hydronephrosis   Hyperlipidemia   Generalized weakness   Falls   FTT (failure to thrive) in adult   Protein-calorie malnutrition, severe   Acute kidney injury     Medication List    TAKE these medications        acetaminophen 325 MG tablet  Commonly known as:  TYLENOL  Take 2 tablets (650 mg total) by mouth every 6 (six) hours as needed for mild pain (or Fever >/= 101).     calcium-vitamin D 500-200 MG-UNIT per tablet  Commonly known as:  OSCAL WITH D  Take 1 tablet by mouth daily with breakfast.     cyanocobalamin 1000 MCG/ML injection  Commonly known as:  (VITAMIN B-12)  Inject 1,000 mcg into the muscle every 30 (thirty) days.     donepezil 5 MG tablet  Commonly known as:  ARICEPT  Take 2 tablets (10 mg total) by mouth at bedtime.     feeding supplement (ENSURE COMPLETE) Liqd  Take 237 mLs by mouth 2 (two) times daily between meals.     letrozole 2.5 MG tablet  Commonly known as:  FEMARA  Take 1 tablet (2.5 mg total) by mouth daily.     losartan 100 MG tablet  Commonly known as:  COZAAR  Take 100 mg by mouth every morning.     trimethoprim 100 MG tablet  Commonly known as:  TRIMPEX  Take 100 mg by mouth at bedtime.        Discharged Condition: Improved    Consults: Telephone with urology  Significant Diagnostic Studies: US Renal  03/12/2015   CLINICAL DATA:  Acute encephalopathy. History of nephrolithiasis and breast cancer. Initial encounter.  EXAM: RENAL / URINARY TRACT ULTRASOUND COMPLETE  COMPARISON:  CT 02/12/2015.  Renal ultrasound 10/16/2004)  FINDINGS: Right Kidney:   Length: 9.2 cm. Stable chronic renal cortical thinning. Mild to moderate hydronephrosis is similar to recent studies. No calculi observed.  Left Kidney:  Length: 8.8 cm. Partially obscured by bowel gas. There is stable cortical thinning without hydronephrosis or focal lesion.  Bladder:  Suboptimally evaluated due to bowel gas and incomplete distension. There is persistent irregular bladder wall thickening, similar to recent CT.  IMPRESSION: 1. Persistent mild to moderate right-sided hydronephrosis of undetermined etiology. 2. Persistent irregular bladder wall thickening as demonstrated on previous CT.   Electronically Signed   By: Richardean Sale M.D.   On: 03/12/2015 08:10   Portable Chest 1 View  03/11/2015   CLINICAL DATA:  Acute encephalopathy, altered mental status  EXAM: PORTABLE CHEST - 1 VIEW  COMPARISON:  01/04/2013  FINDINGS: Stable chronic streaky opacity in the right upper lobe compatible with scarring or post treatment changes. Apical scarring present bilaterally. Normal heart size and vascularity. Previous right mastectomy and axillary lymph node dissection clips. Lungs otherwise clear. No new collapse, consolidation, edema, effusion, or pneumothorax. Trachea is midline. Degenerative changes of the spine.  IMPRESSION: Chronic right upper lobe scarring versus post treatment changes.  No new superimposed acute process.   Electronically Signed   By: Jerilynn Mages.  Shick M.D.   On: 03/11/2015 17:29   Ct Renal Stone Study  02/12/2015  CLINICAL DATA:  Bladder stone, hematuria without pain, abnormal ultrasound, prior hysterectomy and BILATERAL salpingo-oophorectomy, former smoker, history breast cancer  EXAM: CT ABDOMEN AND PELVIS WITHOUT CONTRAST  TECHNIQUE: Multidetector CT imaging of the abdomen and pelvis was performed following the standard protocol without IV contrast. Sagittal and coronal MPR images reconstructed from axial data set. Oral contrast not administered for this indication.  COMPARISON:   02/28/2009 CT abdomen and pelvis ; renal ultrasound 10/16/2014  FINDINGS: Lung bases clear.  Mild RIGHT hydronephrosis with hydroureter extending to the urinary bladder.  No ureteral calculus identified.  Mild perinephric and peripelvic edema at RIGHT kidney.  Normal appearing LEFT renal collecting system and LEFT ureter.  No definite urinary tract calcification or mass.  Abnormal appearance of bladder, incompletely distended, demonstrate markedly thickened wall with diffuse infiltration of perivesicular fat highly suspicious for infection/cystitis though underlying mass cannot be excluded in this setting.  Large calcification inferior to bladder, uncertain if within a urethral diverticulum or associated with vagina, appears too caudal for bladder base unless a cystocele is present.  Gallbladder, uterus, and ovaries surgically absent.  Liver, spleen, pancreas, and adrenal glands unremarkable for technique.  Appendix not visualized ; no pericecal inflammatory process seen.  Stomach and bowel loops otherwise unremarkable.  Scattered atherosclerotic calcifications.  No additional mass, adenopathy, free air, free fluid, or hernia.  No acute osseous findings.  IMPRESSION: Mild RIGHT hydronephrosis and minimal RIGHT hydroureter without ureteral calculus.  Markedly abnormal appearance to urinary bladder, contracted with thickened wall and surrounding inflammatory changes favoring cystitis/infection though tumor not completely excluded with this setting.  Large calcification inferior to expected position of urinary bladder question within urethral diverticulum, associated with vagina or within a cystocele.   Electronically Signed   By: Lavonia Dana M.D.   On: 02/12/2015 15:05    Lab Results: Basic Metabolic Panel:  Recent Labs  03/11/15 1750 03/13/15 0708  NA 138 136  K 4.0 3.6  CL 102 102  CO2 25 24  GLUCOSE 107* 101*  BUN 49* 31*  CREATININE 1.82* 1.21*  CALCIUM 11.2* 10.1   Liver Function  Tests:  Recent Labs  03/11/15 1750  AST 71*  ALT 79*  ALKPHOS 140*  BILITOT 0.7  PROT 8.2*  ALBUMIN 3.8     CBC:  Recent Labs  03/11/15 1750  WBC 13.3*  HGB 9.9*  HCT 30.3*  MCV 99.7  PLT 298    No results found for this or any previous visit (from the past 240 hour(s)).   Hospital Course: This is a 74 year old who was seen in my office today prior to admission. She appeared to be having failure to thrive and I had her get laboratory work done and this showed that she had dehydration and elevated white blood cell count and what appeared to be acute kidney injury. I arranged for her to be hospitalized when her blood work results came back. She was started on IV fluids was given Rocephin for presumed urinary tract infection but unfortunately culture was not done on admission and she had already received antibiotics. Her renal function improved. She had renal ultrasound that shows that she has hydronephrosis and telephone consultation with urology was obtained. She has close follow-up scheduled in about 4 days. It was not felt that she needed or was a candidate for any sort of procedure at this time. She was evaluated by physical therapy and it was felt that she was not able to cooperate with physical therapy well to need  that at home. She gets in her home environment this can be reconsidered if she is more cooperative. She has pending hepatitis panel because she did have some elevation of liver function  Discharge Exam: Blood pressure 128/66, pulse 112, temperature 98.2 F (36.8 C), temperature source Oral, resp. rate 20, weight 55.384 kg (122 lb 1.6 oz), SpO2 100 %. She is confused. She responds slowly to answer questions. Her chest is clear. Her heart is regular.  Disposition: Home with home health services      Discharge Instructions    Face-to-face encounter (required for Medicare/Medicaid patients)    Complete by:  As directed   I Victoriah Wilds L certify that this patient  is under my care and that I, or a nurse practitioner or physician's assistant working with me, had a face-to-face encounter that meets the physician face-to-face encounter requirements with this patient on 03/14/2015. The encounter with the patient was in whole, or in part for the following medical condition(s) which is the primary reason for home health care (List medical condition): dehydration,acute kidney injury  The encounter with the patient was in whole, or in part, for the following medical condition, which is the primary reason for home health care:  dehydration,acute kidney injury  I certify that, based on my findings, the following services are medically necessary home health services:  Nursing  Reason for Medically Necessary Home Health Services:  Skilled Nursing- Change/Decline in Patient Status  My clinical findings support the need for the above services:  Cognitive impairments, dementia, or mental confusion  that make it unsafe to leave home  Further, I certify that my clinical findings support that this patient is homebound due to:  Mental confusion     Home Health    Complete by:  As directed   To provide the following care/treatments:  RN           Follow-up Information    Follow up with [facility].   Contact information:   [address] [place] [address] [phone]       Signed: Alonza Bogus   03/14/2015, 8:32 AM

## 2015-03-14 NOTE — Care Management Note (Signed)
Case Management Note  Patient Details  Name: MARILYNNE DUPUIS MRN: [id number] Date of Birth: [birth year]  Subjective/Objective:                    Action/Plan:   Expected Discharge Date:  03/13/15               Expected Discharge Plan:  [place]  In-House Referral:  NA  Discharge planning Services  CM Consult  Post Acute Care Choice:  Home Health Choice offered to:  Spouse  DME Arranged:    DME Agency:     HH Arranged:  RN [place] Agency:  [facility]  Status of Service:  Completed, signed off  Medicare Important Message Given:    Date Medicare IM Given:    Medicare IM give by:    Date Additional Medicare IM Given:    Additional Medicare Important Message give by:     If discussed at [place] of Stay Meetings, dates discussed:    Additional Comments: Pt discharged home today with [facility] RN (per pts husband choice). Romualdo Bolk of [facility] is aware and will collect the pts information from the chart. [facility] services to start within 48 hours of discharge. No new DME needs noted. Pt and pts nurse aware of discharge arrangements. Christinia Gully [place], RN 03/14/2015, 9:27 AM

## 2015-03-14 NOTE — Progress Notes (Signed)
Subjective: She has pulled out all of her IVs. PT evaluation is noted and appreciated.  Objective: Vital signs in last 24 hours: Temp:  [97.9 F (36.6 C)-98.2 F (36.8 C)] 98.2 F (36.8 C) (07/15 0639) Pulse Rate:  [101-113] 112 (07/15 0639) Resp:  [20] 20 (07/15 0639) BP: (127-143)/(64-69) 128/66 mmHg (07/15 0639) SpO2:  [100 %] 100 % (07/15 0639) Weight change:  Last BM Date: 03/13/15  Intake/Output from previous day: 07/14 0701 - 07/15 0700 In: 840 [P.O.:240; I.V.:600] Out: -   PHYSICAL EXAM General appearance: alert and Confused Resp: clear to auscultation bilaterally Cardio: regular rate and rhythm, S1, S2 normal, no murmur, click, rub or gallop GI: soft, non-tender; bowel sounds normal; no masses,  no organomegaly Extremities: extremities normal, atraumatic, no cyanosis or edema  Lab Results:  Results for orders placed or performed during the hospital encounter of 03/11/15 (from the past 48 hour(s))  Basic metabolic panel     Status: Abnormal   Collection Time: 03/13/15  7:08 AM  Result Value Ref Range   Sodium 136 135 - 145 mmol/L   Potassium 3.6 3.5 - 5.1 mmol/L   Chloride 102 101 - 111 mmol/L   CO2 24 22 - 32 mmol/L   Glucose, Bld 101 (H) 65 - 99 mg/dL   BUN 31 (H) 6 - 20 mg/dL   Creatinine, Ser 1.21 (H) 0.44 - 1.00 mg/dL   Calcium 10.1 8.9 - 10.3 mg/dL   GFR calc non Af Amer 43 (L) >60 mL/min   GFR calc Af Amer 50 (L) >60 mL/min    Comment: (NOTE) The eGFR has been calculated using the CKD EPI equation. This calculation has not been validated in all clinical situations. eGFR's persistently <60 mL/min signify possible Chronic Kidney Disease.    Anion gap 10 5 - 15    ABGS No results for input(s): PHART, PO2ART, TCO2, HCO3 in the last 72 hours.  Invalid input(s): PCO2 CULTURES No results found for this or any previous visit (from the past 240 hour(s)). Studies/Results: No results found.  Medications:  Prior to Admission:  Prescriptions prior to  admission  Medication Sig Dispense Refill Last Dose  . acetaminophen (TYLENOL) 325 MG tablet Take 2 tablets (650 mg total) by mouth every 6 (six) hours as needed for mild pain (or Fever >/= 101).   Unknown  . calcium-vitamin D (OSCAL WITH D) 500-200 MG-UNIT per tablet Take 1 tablet by mouth daily with breakfast.   Past Week at Unknown time  . cyanocobalamin (,VITAMIN B-12,) 1000 MCG/ML injection Inject 1,000 mcg into the muscle every 30 (thirty) days.    Past Month at Unknown time  . feeding supplement, ENSURE COMPLETE, (ENSURE COMPLETE) LIQD Take 237 mLs by mouth 2 (two) times daily between meals.   03/11/2015 at Unknown time  . letrozole (FEMARA) 2.5 MG tablet Take 1 tablet (2.5 mg total) by mouth daily. 60 tablet 5 03/11/2015 at Unknown time  . losartan (COZAAR) 100 MG tablet Take 100 mg by mouth every morning.    Past Week at Unknown time  . trimethoprim (TRIMPEX) 100 MG tablet Take 100 mg by mouth at bedtime.   Past Week at Unknown time  . [DISCONTINUED] enoxaparin (LOVENOX) 40 MG/0.4ML injection Inject 0.4 mLs (40 mg total) into the skin daily. For 10 days 0 Syringe     Scheduled: . cefTRIAXone (ROCEPHIN)  IV  1 g Intravenous Q24H  . donepezil  5 mg Oral QHS  . enoxaparin (LOVENOX) injection  40 mg  Subcutaneous Q24H  . feeding supplement (ENSURE ENLIVE)  237 mL Oral TID BM  . letrozole  2.5 mg Oral Daily  . polyethylene glycol  17 g Oral BID   Continuous: . sodium chloride 1 mL (03/13/15 0015)   QJJ:HERDEYCXKGYJE **OR** acetaminophen, alum & mag hydroxide-simeth, HYDROcodone-acetaminophen, magnesium hydroxide, ondansetron **OR** ondansetron (ZOFRAN) IV  Assesment: She was admitted with dehydration and acute kidney injury. She has severe protein calorie malnutrition and she has not been eating well. I think this is due to progression of her dementia.  There was concern that she might have had a urinary tract infection but unfortunately she did not have urine culture done on admission so  we can't tell for sure. She does have hydronephrosis and has follow-up appointment next week with her urologist.  She has failure to thrive mostly related to her dementia  Principal Problem:   Dehydration Active Problems:   Cognitive decline   Breast cancer of upper-outer quadrant of right female breast   Severe dementia   Hydronephrosis   Hyperlipidemia   Generalized weakness   Falls   FTT (failure to thrive) in adult   Protein-calorie malnutrition, severe   Acute kidney injury    Plan: I'm going to plan to send her home. She's had 3 days of IV antibiotics and I'm not going to send her home on any further antibiotics since I can't tell for sure that she had a UTI. She is on prophylactic medication on a daily basis. She is not felt to be a candidate for physical therapy because she's not really able to cooperate. She will have home health RN.    LOS: 3 days   Azahel Belcastro L 03/14/2015, 8:29 AM

## 2015-03-14 NOTE — Progress Notes (Signed)
Discharge instructions given on medications,and follow up visits,patient's family verbalized understanding. Prescription sent to Pharmacy of choice documented on AVS. [facility] to follow up with patient. Accompanied by staff to an awaiting vehicle.

## 2015-03-15 LAB — HEPATITIS PANEL, ACUTE
HCV Ab: <0.1 s/co ratio (ref 0.0–0.9)
Hep A IgM: NEGATIVE
Hep B C IgM: NEGATIVE
Hepatitis B Surface Ag: NEGATIVE

## 2015-03-19 DIAGNOSIS — R627 Adult failure to thrive: Secondary | ICD-10-CM | POA: Diagnosis not present

## 2015-03-19 DIAGNOSIS — M6281 Muscle weakness (generalized): Secondary | ICD-10-CM | POA: Diagnosis not present

## 2015-03-19 DIAGNOSIS — F0390 Unspecified dementia without behavioral disturbance: Secondary | ICD-10-CM | POA: Diagnosis not present

## 2015-03-19 DIAGNOSIS — E785 Hyperlipidemia, unspecified: Secondary | ICD-10-CM | POA: Diagnosis not present

## 2015-03-19 DIAGNOSIS — E43 Unspecified severe protein-calorie malnutrition: Secondary | ICD-10-CM | POA: Diagnosis not present

## 2015-03-19 DIAGNOSIS — E860 Dehydration: Secondary | ICD-10-CM | POA: Diagnosis not present

## 2015-03-20 DIAGNOSIS — E43 Unspecified severe protein-calorie malnutrition: Secondary | ICD-10-CM | POA: Diagnosis not present

## 2015-03-20 DIAGNOSIS — M6281 Muscle weakness (generalized): Secondary | ICD-10-CM | POA: Diagnosis not present

## 2015-03-20 DIAGNOSIS — E860 Dehydration: Secondary | ICD-10-CM | POA: Diagnosis not present

## 2015-03-20 DIAGNOSIS — F0390 Unspecified dementia without behavioral disturbance: Secondary | ICD-10-CM | POA: Diagnosis not present

## 2015-03-20 DIAGNOSIS — E785 Hyperlipidemia, unspecified: Secondary | ICD-10-CM | POA: Diagnosis not present

## 2015-03-20 DIAGNOSIS — R627 Adult failure to thrive: Secondary | ICD-10-CM | POA: Diagnosis not present

## 2015-03-21 DIAGNOSIS — M6281 Muscle weakness (generalized): Secondary | ICD-10-CM | POA: Diagnosis not present

## 2015-03-21 DIAGNOSIS — E785 Hyperlipidemia, unspecified: Secondary | ICD-10-CM | POA: Diagnosis not present

## 2015-03-21 DIAGNOSIS — E43 Unspecified severe protein-calorie malnutrition: Secondary | ICD-10-CM | POA: Diagnosis not present

## 2015-03-21 DIAGNOSIS — F0390 Unspecified dementia without behavioral disturbance: Secondary | ICD-10-CM | POA: Diagnosis not present

## 2015-03-21 DIAGNOSIS — E860 Dehydration: Secondary | ICD-10-CM | POA: Diagnosis not present

## 2015-03-21 DIAGNOSIS — R627 Adult failure to thrive: Secondary | ICD-10-CM | POA: Diagnosis not present

## 2015-03-22 DIAGNOSIS — E43 Unspecified severe protein-calorie malnutrition: Secondary | ICD-10-CM | POA: Diagnosis not present

## 2015-03-22 DIAGNOSIS — R627 Adult failure to thrive: Secondary | ICD-10-CM | POA: Diagnosis not present

## 2015-03-22 DIAGNOSIS — M6281 Muscle weakness (generalized): Secondary | ICD-10-CM | POA: Diagnosis not present

## 2015-03-22 DIAGNOSIS — E860 Dehydration: Secondary | ICD-10-CM | POA: Diagnosis not present

## 2015-03-22 DIAGNOSIS — E785 Hyperlipidemia, unspecified: Secondary | ICD-10-CM | POA: Diagnosis not present

## 2015-03-22 DIAGNOSIS — F0390 Unspecified dementia without behavioral disturbance: Secondary | ICD-10-CM | POA: Diagnosis not present

## 2015-03-25 ENCOUNTER — Inpatient Hospital Stay (HOSPITAL_COMMUNITY)
Admission: EM | Admit: 2015-03-25 | Discharge: 2015-03-29 | DRG: 689 | Disposition: A | Discharge status: Skilled Nursing Facility | Payer: Medicare Other | Attending: Pulmonary Disease | Admitting: Pulmonary Disease

## 2015-03-25 ENCOUNTER — Encounter (HOSPITAL_COMMUNITY): Payer: Self-pay | Admitting: Emergency Medicine

## 2015-03-25 ENCOUNTER — Emergency Department (HOSPITAL_COMMUNITY): Payer: Medicare Other

## 2015-03-25 DIAGNOSIS — G9340 Encephalopathy, unspecified: Secondary | ICD-10-CM | POA: Diagnosis present

## 2015-03-25 DIAGNOSIS — L89322 Pressure ulcer of left buttock, stage 2: Secondary | ICD-10-CM | POA: Diagnosis not present

## 2015-03-25 DIAGNOSIS — F03C0 Unspecified dementia, severe, without behavioral disturbance, psychotic disturbance, mood disturbance, and anxiety: Secondary | ICD-10-CM | POA: Diagnosis present

## 2015-03-25 DIAGNOSIS — J984 Other disorders of lung: Secondary | ICD-10-CM | POA: Diagnosis not present

## 2015-03-25 DIAGNOSIS — L89151 Pressure ulcer of sacral region, stage 1: Secondary | ICD-10-CM | POA: Diagnosis not present

## 2015-03-25 DIAGNOSIS — I10 Essential (primary) hypertension: Secondary | ICD-10-CM | POA: Diagnosis present

## 2015-03-25 DIAGNOSIS — Z823 Family history of stroke: Secondary | ICD-10-CM

## 2015-03-25 DIAGNOSIS — Z9011 Acquired absence of right breast and nipple: Secondary | ICD-10-CM | POA: Diagnosis present

## 2015-03-25 DIAGNOSIS — N179 Acute kidney failure, unspecified: Secondary | ICD-10-CM | POA: Diagnosis not present

## 2015-03-25 DIAGNOSIS — Z515 Encounter for palliative care: Secondary | ICD-10-CM | POA: Diagnosis not present

## 2015-03-25 DIAGNOSIS — Z79811 Long term (current) use of aromatase inhibitors: Secondary | ICD-10-CM

## 2015-03-25 DIAGNOSIS — E43 Unspecified severe protein-calorie malnutrition: Secondary | ICD-10-CM | POA: Diagnosis not present

## 2015-03-25 DIAGNOSIS — Z87891 Personal history of nicotine dependence: Secondary | ICD-10-CM

## 2015-03-25 DIAGNOSIS — E785 Hyperlipidemia, unspecified: Secondary | ICD-10-CM | POA: Diagnosis present

## 2015-03-25 DIAGNOSIS — C50411 Malignant neoplasm of upper-outer quadrant of right female breast: Secondary | ICD-10-CM | POA: Diagnosis not present

## 2015-03-25 DIAGNOSIS — Z7189 Other specified counseling: Secondary | ICD-10-CM | POA: Insufficient documentation

## 2015-03-25 DIAGNOSIS — Z681 Body mass index (BMI) 19 or less, adult: Secondary | ICD-10-CM | POA: Diagnosis not present

## 2015-03-25 DIAGNOSIS — E860 Dehydration: Secondary | ICD-10-CM | POA: Diagnosis not present

## 2015-03-25 DIAGNOSIS — N390 Urinary tract infection, site not specified: Principal | ICD-10-CM | POA: Diagnosis present

## 2015-03-25 DIAGNOSIS — R627 Adult failure to thrive: Secondary | ICD-10-CM | POA: Diagnosis not present

## 2015-03-25 DIAGNOSIS — R4182 Altered mental status, unspecified: Secondary | ICD-10-CM | POA: Diagnosis present

## 2015-03-25 DIAGNOSIS — M6281 Muscle weakness (generalized): Secondary | ICD-10-CM | POA: Diagnosis not present

## 2015-03-25 DIAGNOSIS — E870 Hyperosmolality and hypernatremia: Secondary | ICD-10-CM

## 2015-03-25 DIAGNOSIS — G9341 Metabolic encephalopathy: Secondary | ICD-10-CM | POA: Diagnosis not present

## 2015-03-25 DIAGNOSIS — L89312 Pressure ulcer of right buttock, stage 2: Secondary | ICD-10-CM | POA: Diagnosis not present

## 2015-03-25 DIAGNOSIS — Z66 Do not resuscitate: Secondary | ICD-10-CM | POA: Diagnosis not present

## 2015-03-25 DIAGNOSIS — F0390 Unspecified dementia without behavioral disturbance: Secondary | ICD-10-CM | POA: Insufficient documentation

## 2015-03-25 LAB — CBC WITH DIFFERENTIAL/PLATELET
BASOS ABS: 0.1 10*3/uL (ref 0.0–0.1)
Basophils Relative: 1 % (ref 0–1)
EOS PCT: 0 % (ref 0–5)
Eosinophils Absolute: 0 10*3/uL (ref 0.0–0.7)
HCT: 30.1 % — ABNORMAL LOW (ref 36.0–46.0)
HEMOGLOBIN: 9.6 g/dL — AB (ref 12.0–15.0)
LYMPHS PCT: 15 % (ref 12–46)
Lymphs Abs: 1.6 10*3/uL (ref 0.7–4.0)
MCH: 33.7 pg (ref 26.0–34.0)
MCHC: 31.9 g/dL (ref 30.0–36.0)
MCV: 105.6 fL — ABNORMAL HIGH (ref 78.0–100.0)
MONO ABS: 0.8 10*3/uL (ref 0.1–1.0)
MONOS PCT: 7 % (ref 3–12)
Neutro Abs: 8.3 10*3/uL — ABNORMAL HIGH (ref 1.7–7.7)
Neutrophils Relative %: 77 % (ref 43–77)
PLATELETS: 431 10*3/uL — AB (ref 150–400)
RBC: 2.85 MIL/uL — AB (ref 3.87–5.11)
RDW: 18.8 % — AB (ref 11.5–15.5)
WBC: 10.8 10*3/uL — ABNORMAL HIGH (ref 4.0–10.5)

## 2015-03-25 LAB — URINE MICROSCOPIC-ADD ON

## 2015-03-25 LAB — COMPREHENSIVE METABOLIC PANEL
ALK PHOS: 166 U/L — AB (ref 38–126)
ALT: 51 U/L (ref 14–54)
ANION GAP: 11 (ref 5–15)
AST: 56 U/L — AB (ref 15–41)
Albumin: 3.6 g/dL (ref 3.5–5.0)
BUN: 64 mg/dL — AB (ref 6–20)
CO2: 28 mmol/L (ref 22–32)
CREATININE: 2.94 mg/dL — AB (ref 0.44–1.00)
Calcium: 12.8 mg/dL — ABNORMAL HIGH (ref 8.9–10.3)
Chloride: 107 mmol/L (ref 101–111)
GFR, EST AFRICAN AMERICAN: 17 mL/min — AB (ref >60)
GFR, EST NON AFRICAN AMERICAN: 15 mL/min — AB (ref >60)
Glucose, Bld: 122 mg/dL — ABNORMAL HIGH (ref 65–99)
POTASSIUM: 3.7 mmol/L (ref 3.5–5.1)
Sodium: 146 mmol/L — ABNORMAL HIGH (ref 135–145)
Total Bilirubin: 0.5 mg/dL (ref 0.3–1.2)
Total Protein: 8.2 g/dL — ABNORMAL HIGH (ref 6.5–8.1)

## 2015-03-25 LAB — URINALYSIS, ROUTINE W REFLEX MICROSCOPIC
BILIRUBIN URINE: NEGATIVE
Glucose, UA: NEGATIVE mg/dL
Ketones, ur: NEGATIVE mg/dL
Nitrite: POSITIVE — AB
PH: 6.5 (ref 5.0–8.0)
Protein, ur: 100 mg/dL — AB
SPECIFIC GRAVITY, URINE: 1.025 (ref 1.005–1.030)
Urobilinogen, UA: 0.2 mg/dL (ref 0.0–1.0)

## 2015-03-25 MED ORDER — PIPERACILLIN-TAZOBACTAM IN DEX 2-0.25 GM/50ML IV SOLN
2.2500 g | Freq: Three times a day (TID) | INTRAVENOUS | Status: DC
  Administered 2015-03-25 – 2015-03-26 (×2): 2.25 g via INTRAVENOUS
  Filled 2015-03-25 (×5): qty 50

## 2015-03-25 MED ORDER — SODIUM CHLORIDE 0.9 % IV BOLUS (SEPSIS)
1000.0000 mL | Freq: Once | INTRAVENOUS | Status: AC
  Administered 2015-03-25: 1000 mL via INTRAVENOUS

## 2015-03-25 MED ORDER — CETYLPYRIDINIUM CHLORIDE 0.05 % MT LIQD
7.0000 mL | Freq: Two times a day (BID) | OROMUCOSAL | Status: DC
  Administered 2015-03-26 (×2): 7 mL via OROMUCOSAL

## 2015-03-25 MED ORDER — ENSURE ENLIVE PO LIQD
237.0000 mL | Freq: Two times a day (BID) | ORAL | Status: DC
  Administered 2015-03-26 – 2015-03-28 (×3): 237 mL via ORAL

## 2015-03-25 MED ORDER — HEPARIN SODIUM (PORCINE) 5000 UNIT/ML IJ SOLN
5000.0000 [IU] | Freq: Three times a day (TID) | INTRAMUSCULAR | Status: DC
  Administered 2015-03-25 – 2015-03-28 (×10): 5000 [IU] via SUBCUTANEOUS
  Filled 2015-03-25 (×10): qty 1

## 2015-03-25 MED ORDER — SODIUM CHLORIDE 0.9 % IJ SOLN
3.0000 mL | Freq: Two times a day (BID) | INTRAMUSCULAR | Status: DC
  Administered 2015-03-25 – 2015-03-28 (×4): 3 mL via INTRAVENOUS

## 2015-03-25 MED ORDER — SODIUM CHLORIDE 0.9 % IV SOLN
INTRAVENOUS | Status: DC
  Administered 2015-03-25 – 2015-03-29 (×6): via INTRAVENOUS

## 2015-03-25 MED ORDER — LOSARTAN POTASSIUM 50 MG PO TABS
100.0000 mg | ORAL_TABLET | Freq: Every morning | ORAL | Status: DC
  Administered 2015-03-26 – 2015-03-29 (×4): 100 mg via ORAL
  Filled 2015-03-25 (×4): qty 2

## 2015-03-25 MED ORDER — PIPERACILLIN-TAZOBACTAM IN DEX 2-0.25 GM/50ML IV SOLN
INTRAVENOUS | Status: AC
  Filled 2015-03-25: qty 50

## 2015-03-25 MED ORDER — DONEPEZIL HCL 5 MG PO TABS
10.0000 mg | ORAL_TABLET | Freq: Every day | ORAL | Status: DC
  Administered 2015-03-25 – 2015-03-28 (×4): 10 mg via ORAL
  Filled 2015-03-25 (×5): qty 2

## 2015-03-25 MED ORDER — ONDANSETRON HCL 4 MG PO TABS: 4.0000 mg | ORAL_TABLET | Freq: Four times a day (QID) | ORAL | Status: DC | PRN

## 2015-03-25 MED ORDER — DEXTROSE 5 % IV SOLN
1.0000 g | Freq: Once | INTRAVENOUS | Status: AC
  Administered 2015-03-25: 1 g via INTRAVENOUS
  Filled 2015-03-25: qty 10

## 2015-03-25 MED ORDER — CHLORHEXIDINE GLUCONATE 0.12 % MT SOLN
15.0000 mL | Freq: Two times a day (BID) | OROMUCOSAL | Status: DC
  Administered 2015-03-25 – 2015-03-26 (×3): 15 mL via OROMUCOSAL
  Filled 2015-03-25 (×4): qty 15

## 2015-03-25 MED ORDER — ONDANSETRON HCL 4 MG/2ML IJ SOLN: 4.0000 mg | Freq: Four times a day (QID) | INTRAMUSCULAR | Status: DC | PRN

## 2015-03-25 MED ORDER — LETROZOLE 2.5 MG PO TABS
2.5000 mg | ORAL_TABLET | Freq: Every day | ORAL | Status: DC
  Administered 2015-03-26 – 2015-03-29 (×4): 2.5 mg via ORAL
  Filled 2015-03-25 (×6): qty 1

## 2015-03-25 NOTE — H&P (Signed)
[facility] History and Physical  Shelly Willis GLO:[id number] DOB: Dec 10, 1940 DOA: 03/25/2015  Referring physician: Dr. Roderic Palau - [facility] PCP: Alonza Bogus, MD   Chief Complaint: AMS  HPI: Shelly Willis is a 74 y.o. female  6 day history of progressive generalized weakness and little to no oral intake. Patient with baseline severe dementia. Husband cares for patient at home. Husband states that patient gets like this whenever she develops an illness. Symptoms are constant, getting worse, nothing makes her symptoms better or worse. Husband states she's taken and maybe 12 ounces of fluids over the last couple of days.the only liquid she will taken at this time are insurers.intermittent constipation and diarrhea. Patient also has chronic sacral decubitus ulcers to been there for months. These are not better or worse. Of note patient admitted on 03/11/2015 for similar presentation which was felt to be due to UTI.  Low 5 caveat: Patient severely demented. History given by ED physician and patient's husband who is her caretaker.   Review of Systems:   Unable to obtain further review of systems due to patient's condition. Refer to history of present illness.   Past Medical History  Diagnosis Date  . Cataract   . Hyperlipidemia   . History of neck surgery cervical laminectomy  . Urethral polyp removed AUG 2010  . Stress incontinence, female   . Migraines   . PONV (postoperative nausea and vomiting)   . H/O exercise stress test     done in PCP- office, maybe 15 yrs. ago   . Cancer     right breast  . Aromatase inhibitor use Nov. 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  Planned for "minimum of 5 years"  . Breast cancer 06/16/11    Right Breast   . Generalized headaches   . Hydronephrosis   . B12 deficiency    Past Surgical History  Procedure Laterality Date  . Cholecystectomy   1993  . Tubal ligation  1977  . Bladder surgery  2011    growth attached to bladder stem   . Bilateral salpingoophorectomy    . Ruptured disc surgery 1988  1988  . Kidney stones    . Modified mastectomy  12/28/2011    Procedure: MODIFIED MASTECTOMY;  Surgeon: Pedro Earls, MD;  Location: [facility];  Service: General;  Laterality: Right;  Right modified mastectomy/   . Breast surgery  12/01/2011    Right breast lumpectomy  . Needle core biopsy  11/02/11    Left Breast Needle Core Biopsy, 9'Oclocl, No Malignancy: Biospy Axilla - Mammary Carcinoma, ER/PR Positive, Low Ki67  . Needle core biopsy  06/16/11    Right Breast - Invasive Mammary with Lobular features  . Abdominal hysterectomy  1997    complete   Social History:  reports that she quit smoking about 27 years ago. Her smoking use included Cigarettes. She smoked 1.00 pack per day. She has never used smokeless tobacco. She reports that she does not drink alcohol or use illicit drugs.  Allergies  Allergen Reactions  . Vesicare [Solifenacin Succinate] Other (See Comments)    Headaches and upset stomach     Family History  Problem Relation Age of Onset  . Cancer Neg Hx   . Stroke Mother     in mid 39's     Prior to Admission medications   Medication Sig Start Date End Date Taking?  Authorizing Provider  acetaminophen (TYLENOL) 325 MG tablet Take 2 tablets (650 mg total) by mouth every 6 (six) hours as needed for mild pain (or Fever >/= 101). 10/16/14  Yes Sinda Du, MD  calcium-vitamin D (OSCAL WITH D) 500-200 MG-UNIT per tablet Take 1 tablet by mouth daily with breakfast.   Yes Historical Provider, MD  cyanocobalamin (,VITAMIN B-12,) 1000 MCG/ML injection Inject 1,000 mcg into the muscle every 30 (thirty) days.    Yes Historical Provider, MD  donepezil (ARICEPT) 5 MG tablet Take 2 tablets (10 mg total) by mouth at bedtime. 03/14/15  Yes Sinda Du, MD  feeding supplement, ENSURE COMPLETE, (ENSURE COMPLETE)  LIQD Take 237 mLs by mouth 2 (two) times daily between meals. 10/16/14  Yes Sinda Du, MD  letrozole [facility]) 2.5 MG tablet Take 1 tablet (2.5 mg total) by mouth daily. 05/07/14  Yes Chauncey Cruel, MD  losartan (COZAAR) 100 MG tablet Take 100 mg by mouth every morning.    Yes Historical Provider, MD   Physical Exam: Filed Vitals:   03/25/15 1300 03/25/15 1330 03/25/15 1400 03/25/15 1732  BP: 123/73 155/126 123/94 151/87  Pulse: 112 133 97 115  Temp:    98 F (36.7 C)  TempSrc:    Oral  Resp: $Remo'16 24 20 20  'qDTqT$ Height:    '5\' 5"'$  (1.651 m)  Weight:    53.751 kg (118 lb 8 oz)  SpO2: 97% 100% 80% 95%    Wt Readings from Last 3 Encounters:  03/25/15 53.751 kg (118 lb 8 oz)  03/11/15 55.384 kg (122 lb 1.6 oz)  10/13/14 58.605 kg (129 lb 3.2 oz)    General: appears comfortable but frail. Eyes:  PERRL, normal lids, irises & conjunctiva ENT: very dry mucous membranes Neck:  no LAD, masses or thyromegaly Cardiovascular:  RRR, faint heart sounds, 2/6 systolic murmur, 1+ lower extremity edema bilaterally. Respiratory:  CTA bilaterally, no w/r/r. Normal respiratory effort. Abdomen: suprapubic tenderness to palpation, normoactive bowel sounds, nondistended Skin: no rash or induration seen on limited exam Musculoskeletal: globally decreased tone but symmetric. Psychiatric: non-participatory, attempts to do my hands when palpating areas of tenderness. Neurologic: no focal deficits outside of mental incapacitation.          Labs on Admission:  Basic Metabolic Panel:  Recent Labs Lab 03/25/15 1240  NA 146*  K 3.7  CL 107  CO2 28  GLUCOSE 122*  BUN 64*  CREATININE 2.94*  CALCIUM 12.8*   Liver Function Tests:  Recent Labs Lab 03/25/15 1240  AST 56*  ALT 51  ALKPHOS 166*  BILITOT 0.5  PROT 8.2*  ALBUMIN 3.6   No results for input(s): LIPASE, AMYLASE in the last 168 hours. No results for input(s): AMMONIA in the last 168 hours. CBC:  Recent Labs Lab 03/25/15 1240  WBC  10.8*  NEUTROABS 8.3*  HGB 9.6*  HCT 30.1*  MCV 105.6*  PLT 431*   Cardiac Enzymes: No results for input(s): CKTOTAL, CKMB, CKMBINDEX, TROPONINI in the last 168 hours.  BNP (last 3 results) No results for input(s): BNP in the last 8760 hours.  ProBNP (last 3 results) No results for input(s): PROBNP in the last 8760 hours.  CBG: No results for input(s): GLUCAP in the last 168 hours.  Radiological Exams on Admission: Dg Chest Portable 1 View  03/25/2015   CLINICAL DATA:  Decreased appetite and weakness. History of breast carcinoma  EXAM: PORTABLE CHEST - 1 VIEW  COMPARISON:  March 11, 2015  FINDINGS: There  is scarring in the right apex, stable. There is apical pleural thickening bilaterally, stable. There is no edema or consolidation. Heart size and pulmonary vascularity are normal. No adenopathy. There is degenerative type change in the thoracic spine.  IMPRESSION: Apical scarring, more on the right than on the left. No edema or consolidation.   Electronically Signed   By: Lowella Grip III M.D.   On: 03/25/2015 13:58      Assessment/Plan Principal Problem:   Acute encephalopathy Active Problems:   Breast cancer of upper-outer quadrant of right female breast   Severe dementia   UTI (lower urinary tract infection)   Dehydration   Protein-calorie malnutrition, severe   Hypernatremia   AKI (acute kidney injury)   Decubitus ulcer of sacral region, stage 1   Acute encephalopathy: Likely secondary to recurrent UTI compounding severe advanced dementia. WBC 10.8. No culture obtained from previous admission (2 wks ago) by which to tailor medical therapy. Second UTI in a month. Patient with a catheter in place per ED. - telemetry - Zosyn (consider narrowing therapy once UCX returns) - Blood culture - Urine culture - lactic acid  Dehydration: patient with very little fluid intake over the last 5-6 days. Sodium 146, creatinine 2.94. BUN 64. Baseline creatinine 1. - IVF - BMET  in a.m.  Sacral decubitus ulcers: stage I-II. Baseline - wound care - air overlay   Dementia: Very advanced at baseline. Minimally communicative. Requires intense over for patient to take and food. - continue Aricept though recommend discharge after leaving the hospital is likely very little benefit.  R breast cancer: stage III invasive lobular breast cancer. Status post right mastectomy April 2013. Followed by Hemoccult. - continue Letrozole  Hypertension: - Continue losartan.  Severe protein calorie malnutrition: - Ensure - Nutrition consult - PT/OT    Code Status: DNR  DVT Prophylaxis: Hep Family Communication: Husband Disposition Plan: Pending improvement  Mildred Bollard Lenna Sciara, MD Family Medicine [facility] [email or web address] Password [email or web address]

## 2015-03-25 NOTE — ED Notes (Signed)
hospitalist at bedside

## 2015-03-25 NOTE — ED Notes (Signed)
Attempted to call report, nurse to call back. 

## 2015-03-25 NOTE — ED Notes (Signed)
Patient has skin tears/tissue breakdown to bilateral buttocks.

## 2015-03-25 NOTE — ED Notes (Signed)
MD at bedside. 

## 2015-03-25 NOTE — ED Notes (Signed)
Nurse to call back report.

## 2015-03-25 NOTE — Progress Notes (Signed)
Dr. Marily Memos paged upon patient arriving to unit.  No return page at this time.  Awaiting orders to be placed.  Will report to oncoming shift RN.

## 2015-03-25 NOTE — Progress Notes (Signed)
ANTIBIOTIC CONSULT NOTE-Preliminary  Pharmacy Consult for Zosyn Indication: UTI  Allergies  Allergen Reactions  . Vesicare [Solifenacin Succinate] Other (See Comments)    Headaches and upset stomach    Patient Measurements: Height: 5\' 5"  (165.1 cm) Weight: 118 lb 8 oz (53.751 kg) IBW/kg (Calculated) : 57  Vital Signs: Temp: 98.1 F (36.7 C) (07/26 2220) Temp Source: Axillary (07/26 2220) BP: 133/64 mmHg (07/26 2220) Pulse Rate: 110 (07/26 2220)  Labs:  Recent Labs  03/25/15 1240  WBC 10.8*  HGB 9.6*  PLT 431*  CREATININE 2.94*    Estimated Creatinine Clearance: 14.3 mL/min (by C-G formula based on Cr of 2.94).  No results for input(s): VANCOTROUGH, VANCOPEAK, VANCORANDOM, GENTTROUGH, GENTPEAK, GENTRANDOM, TOBRATROUGH, TOBRAPEAK, TOBRARND, AMIKACINPEAK, AMIKACINTROU, AMIKACIN in the last 72 hours.   Microbiology: No results found for this or any previous visit (from the past 720 hour(s)).  Medical History: Past Medical History  Diagnosis Date  . Cataract   . Hyperlipidemia   . History of neck surgery cervical laminectomy  . Urethral polyp removed AUG 2010  . Stress incontinence, female   . Migraines   . PONV (postoperative nausea and vomiting)   . H/O exercise stress test     done in PCP- office, maybe 15 yrs. ago   . Cancer     right breast  . Aromatase inhibitor use Nov. 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  Planned for "minimum of 5 years"  . Breast cancer 06/16/11    Right Breast   . Generalized headaches   . Hydronephrosis   . B12 deficiency    Zosyn 7/26 >>  Assessment: 74yo female admitted with failure to thrive, sacral ulcers, and suspected UTI.  Asked to initiate Zosyn. SCr elevated on admission.  Goal of Therapy:  Eradicate infection.  Plan:  Preliminary review of pertinent patient information completed.  Protocol will be initiated with Zosyn  2.25gm IV q8h (renally adjusted).  Forestine Na clinical pharmacist will complete review of labs during morning rounds to assess patient and finalize treatment regimen.  Hart Robinsons A, RPH 03/25/2015,10:25 PM

## 2015-03-25 NOTE — ED Provider Notes (Signed)
CSN: [id number]     Arrival date & time 03/25/15  1217 History   First MD Initiated Contact with Patient 03/25/15 1302     Chief Complaint  Patient presents with  . Dehydration     (Consider location/radiation/quality/duration/timing/severity/associated sxs/prior Treatment) Patient is a 74 y.o. female presenting with weakness. The history is provided by the spouse (the pt has had poor po intake for a few days.  she has a hx of severe dementia.   ).  Weakness This is a recurrent problem. The current episode started more than 2 days ago. The problem occurs constantly. The problem has not changed since onset.Nothing aggravates the symptoms.    Past Medical History  Diagnosis Date  . Cataract   . Hyperlipidemia   . History of neck surgery cervical laminectomy  . Urethral polyp removed AUG 2010  . Stress incontinence, female   . Migraines   . PONV (postoperative nausea and vomiting)   . H/O exercise stress test     done in PCP- office, maybe 15 yrs. ago   . Cancer     right breast  . Aromatase inhibitor use Nov. 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  Planned for "minimum of 5 years"  . Breast cancer 06/16/11    Right Breast   . Generalized headaches   . Hydronephrosis   . B12 deficiency    Past Surgical History  Procedure Laterality Date  . Cholecystectomy  1993  . Tubal ligation  1977  . Bladder surgery  2011    growth attached to bladder stem   . Bilateral salpingoophorectomy    . Ruptured disc surgery 1988  1988  . Kidney stones    . Modified mastectomy  12/28/2011    Procedure: MODIFIED MASTECTOMY;  Surgeon: Pedro Earls, MD;  Location: [facility];  Service: General;  Laterality: Right;  Right modified mastectomy/   . Breast surgery  12/01/2011    Right breast lumpectomy  . Needle core biopsy  11/02/11    Left Breast Needle Core Biopsy, 9'Oclocl, No Malignancy:  Biospy Axilla - Mammary Carcinoma, ER/PR Positive, Low Ki67  . Needle core biopsy  06/16/11    Right Breast - Invasive Mammary with Lobular features  . Abdominal hysterectomy  1997    complete   Family History  Problem Relation Age of Onset  . Cancer Neg Hx   . Stroke Mother     in mid 18's   History  Substance Use Topics  . Smoking status: Former Smoker -- 1.00 packs/day    Types: Cigarettes    Quit date: 07/07/1987  . Smokeless tobacco: Never Used  . Alcohol Use: 0.6 oz/week    1 Glasses of wine per week     Comment: socially   OB History    Gravida Para Term Preterm AB TAB SAB Ectopic Multiple Living   3 3              Obstetric Comments   Menarche age 49, Parity age 51, Gx, T42, Hysterectomy age 32, HRt ~ 76 years     Review of Systems  Unable to perform ROS: Dementia      Allergies  Vesicare  Home Medications   Prior to Admission medications   Medication Sig Start Date End Date Taking? Authorizing Provider  acetaminophen (TYLENOL) 325 MG tablet Take 2 tablets (650 mg total) by mouth every 6 (  six) hours as needed for mild pain (or Fever >/= 101). 10/16/14  Yes Sinda Du, MD  calcium-vitamin D (OSCAL WITH D) 500-200 MG-UNIT per tablet Take 1 tablet by mouth daily with breakfast.   Yes Historical Provider, MD  cyanocobalamin (,VITAMIN B-12,) 1000 MCG/ML injection Inject 1,000 mcg into the muscle every 30 (thirty) days.    Yes Historical Provider, MD  donepezil (ARICEPT) 5 MG tablet Take 2 tablets (10 mg total) by mouth at bedtime. 03/14/15  Yes Sinda Du, MD  feeding supplement, ENSURE COMPLETE, (ENSURE COMPLETE) LIQD Take 237 mLs by mouth 2 (two) times daily between meals. 10/16/14  Yes Sinda Du, MD  letrozole [facility]) 2.5 MG tablet Take 1 tablet (2.5 mg total) by mouth daily. 05/07/14  Yes Chauncey Cruel, MD  losartan (COZAAR) 100 MG tablet Take 100 mg by mouth every morning.    Yes Historical Provider, MD   BP 123/94 mmHg  Pulse 97  Temp(Src)  98.1 F (36.7 C) (Oral)  Resp 20  Ht $R'5\' 5"'AG$  (1.651 m)  Wt 121 lb (54.885 kg)  BMI 20.14 kg/m2  SpO2 80% Physical Exam  Constitutional: She appears well-developed.  HENT:  Head: Normocephalic.  Mucous membranes dry  Eyes: Conjunctivae and EOM are normal. No scleral icterus.  Neck: Neck supple. No thyromegaly present.  Cardiovascular: Normal rate and regular rhythm.  Exam reveals no gallop and no friction rub.   No murmur heard. Pulmonary/Chest: No stridor. She has no wheezes. She has no rales. She exhibits no tenderness.  Abdominal: She exhibits no distension. There is no tenderness. There is no rebound.  Musculoskeletal: Normal range of motion. She exhibits no edema.  Lymphadenopathy:    She has no cervical adenopathy.  Neurological: She exhibits normal muscle tone. Coordination normal.  Lethargic and oriented to person only  Skin: No rash noted. No erythema.  Psychiatric: She has a normal mood and affect. Her behavior is normal.    ED Course  Procedures (including critical care time) Labs Review Labs Reviewed  CBC WITH DIFFERENTIAL/PLATELET - Abnormal; Notable for the following:    WBC 10.8 (*)    RBC 2.85 (*)    Hemoglobin 9.6 (*)    HCT 30.1 (*)    MCV 105.6 (*)    RDW 18.8 (*)    Platelets 431 (*)    Neutro Abs 8.3 (*)    All other components within normal limits  COMPREHENSIVE METABOLIC PANEL - Abnormal; Notable for the following:    Sodium 146 (*)    Glucose, Bld 122 (*)    BUN 64 (*)    Creatinine, Ser 2.94 (*)    Calcium 12.8 (*)    Total Protein 8.2 (*)    AST 56 (*)    Alkaline Phosphatase 166 (*)    GFR calc non Af Amer 15 (*)    GFR calc Af Amer 17 (*)    All other components within normal limits  URINALYSIS, ROUTINE W REFLEX MICROSCOPIC (NOT AT [facility]) - Abnormal; Notable for the following:    APPearance HAZY (*)    Hgb urine dipstick LARGE (*)    Protein, ur 100 (*)    Nitrite POSITIVE (*)    Leukocytes, UA LARGE (*)    All other components within  normal limits  URINE MICROSCOPIC-ADD ON - Abnormal; Notable for the following:    Squamous Epithelial / LPF FEW (*)    Bacteria, UA MANY (*)    All other components within normal limits  URINE  CULTURE    Imaging Review Dg Chest Portable 1 View  03/25/2015   CLINICAL DATA:  Decreased appetite and weakness. History of breast carcinoma  EXAM: PORTABLE CHEST - 1 VIEW  COMPARISON:  March 11, 2015  FINDINGS: There is scarring in the right apex, stable. There is apical pleural thickening bilaterally, stable. There is no edema or consolidation. Heart size and pulmonary vascularity are normal. No adenopathy. There is degenerative type change in the thoracic spine.  IMPRESSION: Apical scarring, more on the right than on the left. No edema or consolidation.   Electronically Signed   By: Lowella Grip III M.D.   On: 03/25/2015 13:58     EKG Interpretation None      MDM   Final diagnoses:  None   Uti, dementia, dehydration,  Dnr,  Will admit for hydration and antibiotics  Milton Ferguson, MD 03/25/15 1539

## 2015-03-25 NOTE — ED Notes (Signed)
Husband states that pt has not been eating or drinking anything for the past few weeks.  States was seen by the home health nurse today who told him to bring her to ER for fluids.  Recently tx for UTI

## 2015-03-26 MED ORDER — PIPERACILLIN SOD-TAZOBACTAM SO 2.25 (2-0.25) G IV SOLR
2.2500 g | Freq: Three times a day (TID) | INTRAVENOUS | Status: DC
  Administered 2015-03-26 – 2015-03-27 (×3): 2.25 g via INTRAVENOUS
  Filled 2015-03-26 (×7): qty 2.25

## 2015-03-26 MED ORDER — ENSURE ENLIVE PO LIQD
237.0000 mL | Freq: Three times a day (TID) | ORAL | Status: DC
  Administered 2015-03-26 – 2015-03-29 (×8): 237 mL via ORAL

## 2015-03-26 MED ORDER — PRO-STAT SUGAR FREE PO LIQD
30.0000 mL | Freq: Every day | ORAL | Status: DC
  Administered 2015-03-26 – 2015-03-29 (×4): 30 mL via ORAL
  Filled 2015-03-26 (×4): qty 30

## 2015-03-26 NOTE — Clinical Social Work Note (Signed)
Clinical Social Work Assessment  Patient Details  Name: Shelly Willis MRN: [id number] Date of Birth: 17-Jul-1941  Date of referral:  03/26/15               Reason for consult:  Facility Placement                Permission sought to share information with:    Permission granted to share information::     Name::        Agency::     Relationship::     Contact Information:     Housing/Transportation Living arrangements for the past 2 months:  Single Family Home Source of Information:  Spouse Patient Interpreter Needed:  None Criminal Activity/Legal Involvement Pertinent to Current Situation/Hospitalization:  No - Comment as needed Significant Relationships:  Spouse, Adult Children Lives with:  Spouse Do you feel safe going back to the place where you live?  Yes Need for family participation in patient care:  Yes (Comment)  Care giving concerns:  Patient's husband has been providing care for patient but at this time patient's needs are exceeding his ability.   Social Worker assessment / plan:  Patient's husband advised that patient has been in the home and he has been her primary care provider.  He advised that patient is basically bed bound and is unable to ambulate or complete any ADLs independently.  Mr. Jokerst advised that he is interested in having patient placed at [facility] due to her ongoing care needs.    Employment status:  Retired Nurse, adult PT Recommendations:  Not assessed at this time [place] / Referral to community resources:  [place]  Patient/Family's Response to care:  Mr. Dina is agreeable for patient to go to [facility].   Patient/Family's Understanding of and Emotional Response to Diagnosis, Current Treatment, and Prognosis:  Mr. Bohan has provided care for patient for an extended period of time.  Patient's care needs have become too great for husband and SNF is now needed.    Emotional Assessment Appearance:   Developmentally appropriate Attitude/Demeanor/Rapport:  Unable to Assess Affect (typically observed):  Unable to Assess Orientation:    Alcohol / Substance use:  Not Applicable Psych involvement (Current and /or in the community):  No (Comment)  Discharge Needs  Concerns to be addressed:  Discharge Planning Concerns Readmission within the last 30 days:  Yes Current discharge risk:  None Barriers to Discharge:  No Barriers Identified   Ihor Gully, LCSW 03/26/2015, 12:19 PM [phone]

## 2015-03-26 NOTE — Care Management Note (Signed)
Case Management Note  Patient Details  Name: VERLENA MARLETTE MRN: [id number] Date of Birth: April 21, 1941  Subjective/Objective:                  Pt admitted from home with dehydration and UTI. Pt lives with her husband but husband is interested in placement at discharge. Pt is active with [facility] RN and Pt. Pt has walker in the home.  Action/Plan: PT does not recommend any PT follow up. Palliative care consult pending. CSW is aware that pts husband is requesting placement.  Expected Discharge Date:                  Expected Discharge Plan:  Skilled Nursing Facility  In-House Referral:  Clinical Social Work  Discharge planning Services  CM Consult  Post Acute Care Choice:  NA Choice offered to:  NA  DME Arranged:    DME Agency:     HH Arranged:    [place] Agency:     Status of Service:  In process, will continue to follow  Medicare Important Message Given:    Date Medicare IM Given:    Medicare IM give by:    Date Additional Medicare IM Given:    Additional Medicare Important Message give by:     If discussed at [place] of Stay Meetings, dates discussed:    Additional Comments:  Joylene Draft, RN 03/26/2015, 12:05 PM

## 2015-03-26 NOTE — Progress Notes (Signed)
Subjective: She is unresponsive. She had been in the hospital with a similar episode and went home but her husband was not able to take care of her so she is back. She is dehydrated and has acute kidney injury.  Objective: Vital signs in last 24 hours: Temp:  [97.9 F (36.6 C)-98.1 F (36.7 C)] 97.9 F (36.6 C) (07/27 0503) Pulse Rate:  [97-133] 103 (07/27 0503) Resp:  [16-24] 18 (07/27 0503) BP: (123-155)/(62-126) 152/71 mmHg (07/27 0503) SpO2:  [80 %-100 %] 99 % (07/27 0503) Weight:  [53.751 kg (118 lb 8 oz)-54.885 kg (121 lb)] 53.751 kg (118 lb 8 oz) (07/26 1732) Weight change:  Last BM Date: 03/25/15  Intake/Output from previous day: 07/26 0701 - 07/27 0700 In: 996.7 [I.V.:946.7; IV Piggyback:50] Out: 500 [Urine:500]  PHYSICAL EXAM General appearance: no distress and Poorly responsive Resp: clear to auscultation bilaterally Cardio: regular rate and rhythm, S1, S2 normal, no murmur, click, rub or gallop GI: soft, non-tender; bowel sounds normal; no masses,  no organomegaly Extremities: extremities normal, atraumatic, no cyanosis or edema  Lab Results:  Results for orders placed or performed during the hospital encounter of 03/25/15 (from the past 48 hour(s))  CBC with Differential/Platelet     Status: Abnormal   Collection Time: 03/25/15 12:40 PM  Result Value Ref Range   WBC 10.8 (H) 4.0 - 10.5 K/uL   RBC 2.85 (L) 3.87 - 5.11 MIL/uL   Hemoglobin 9.6 (L) 12.0 - 15.0 g/dL   HCT 30.1 (L) 36.0 - 46.0 %   MCV 105.6 (H) 78.0 - 100.0 fL   MCH 33.7 26.0 - 34.0 pg   MCHC 31.9 30.0 - 36.0 g/dL   RDW 18.8 (H) 11.5 - 15.5 %   Platelets 431 (H) 150 - 400 K/uL   Neutrophils Relative % 77 43 - 77 %   Lymphocytes Relative 15 12 - 46 %   Monocytes Relative 7 3 - 12 %   Eosinophils Relative 0 0 - 5 %   Basophils Relative 1 0 - 1 %   Neutro Abs 8.3 (H) 1.7 - 7.7 K/uL   Lymphs Abs 1.6 0.7 - 4.0 K/uL   Monocytes Absolute 0.8 0.1 - 1.0 K/uL   Eosinophils Absolute 0.0 0.0 - 0.7 K/uL    Basophils Absolute 0.1 0.0 - 0.1 K/uL   RBC Morphology POLYCHROMASIA PRESENT     Comment: RARE NRBCs   WBC Morphology ATYPICAL LYMPHOCYTES   Comprehensive metabolic panel     Status: Abnormal   Collection Time: 03/25/15 12:40 PM  Result Value Ref Range   Sodium 146 (H) 135 - 145 mmol/L   Potassium 3.7 3.5 - 5.1 mmol/L   Chloride 107 101 - 111 mmol/L   CO2 28 22 - 32 mmol/L   Glucose, Bld 122 (H) 65 - 99 mg/dL   BUN 64 (H) 6 - 20 mg/dL   Creatinine, Ser 2.94 (H) 0.44 - 1.00 mg/dL   Calcium 12.8 (H) 8.9 - 10.3 mg/dL   Total Protein 8.2 (H) 6.5 - 8.1 g/dL   Albumin 3.6 3.5 - 5.0 g/dL   AST 56 (H) 15 - 41 U/L   ALT 51 14 - 54 U/L   Alkaline Phosphatase 166 (H) 38 - 126 U/L   Total Bilirubin 0.5 0.3 - 1.2 mg/dL   GFR calc non Af Amer 15 (L) >60 mL/min   GFR calc Af Amer 17 (L) >60 mL/min    Comment: (NOTE) The eGFR has been calculated using the CKD EPI  equation. This calculation has not been validated in all clinical situations. eGFR's persistently <60 mL/min signify possible Chronic Kidney Disease.    Anion gap 11 5 - 15  Urinalysis, Routine w reflex microscopic (not at [facility])     Status: Abnormal   Collection Time: 03/25/15  2:20 PM  Result Value Ref Range   Color, Urine YELLOW YELLOW   APPearance HAZY (A) CLEAR   Specific Gravity, Urine 1.025 1.005 - 1.030   pH 6.5 5.0 - 8.0   Glucose, UA NEGATIVE NEGATIVE mg/dL   Hgb urine dipstick LARGE (A) NEGATIVE   Bilirubin Urine NEGATIVE NEGATIVE   Ketones, ur NEGATIVE NEGATIVE mg/dL   Protein, ur 100 (A) NEGATIVE mg/dL   Urobilinogen, UA 0.2 0.0 - 1.0 mg/dL   Nitrite POSITIVE (A) NEGATIVE   Leukocytes, UA LARGE (A) NEGATIVE  Urine microscopic-add on     Status: Abnormal   Collection Time: 03/25/15  2:20 PM  Result Value Ref Range   Squamous Epithelial / LPF FEW (A) RARE   WBC, UA TOO NUMEROUS TO COUNT <3 WBC/hpf   RBC / HPF TOO NUMEROUS TO COUNT <3 RBC/hpf   Bacteria, UA MANY (A) RARE    ABGS No results for input(s):  PHART, PO2ART, TCO2, HCO3 in the last 72 hours.  Invalid input(s): PCO2 CULTURES No results found for this or any previous visit (from the past 240 hour(s)). Studies/Results: Dg Chest Portable 1 View  03/25/2015   CLINICAL DATA:  Decreased appetite and weakness. History of breast carcinoma  EXAM: PORTABLE CHEST - 1 VIEW  COMPARISON:  March 11, 2015  FINDINGS: There is scarring in the right apex, stable. There is apical pleural thickening bilaterally, stable. There is no edema or consolidation. Heart size and pulmonary vascularity are normal. No adenopathy. There is degenerative type change in the thoracic spine.  IMPRESSION: Apical scarring, more on the right than on the left. No edema or consolidation.   Electronically Signed   By: Lowella Grip III M.D.   On: 03/25/2015 13:58    Medications:  Prior to Admission:  Prescriptions prior to admission  Medication Sig Dispense Refill Last Dose  . acetaminophen (TYLENOL) 325 MG tablet Take 2 tablets (650 mg total) by mouth every 6 (six) hours as needed for mild pain (or Fever >/= 101).   unknown  . calcium-vitamin D (OSCAL WITH D) 500-200 MG-UNIT per tablet Take 1 tablet by mouth daily with breakfast.   Past Week at Unknown time  . cyanocobalamin (,VITAMIN B-12,) 1000 MCG/ML injection Inject 1,000 mcg into the muscle every 30 (thirty) days.    june 2016  . donepezil (ARICEPT) 5 MG tablet Take 2 tablets (10 mg total) by mouth at bedtime. 30 tablet 0 Past Week at Unknown time  . feeding supplement, ENSURE COMPLETE, (ENSURE COMPLETE) LIQD Take 237 mLs by mouth 2 (two) times daily between meals.   03/24/2015 at Unknown time  . letrozole (FEMARA) 2.5 MG tablet Take 1 tablet (2.5 mg total) by mouth daily. 60 tablet 5 Past Week at Unknown time  . losartan (COZAAR) 100 MG tablet Take 100 mg by mouth every morning.    Past Week at Unknown time   Scheduled: . antiseptic oral rinse  7 mL Mouth Rinse q12n4p  . chlorhexidine  15 mL Mouth Rinse BID  .  donepezil  10 mg Oral QHS  . feeding supplement (ENSURE ENLIVE)  237 mL Oral BID BM  . heparin  5,000 Units Subcutaneous 3 times per day  .  letrozole  2.5 mg Oral Daily  . losartan  100 mg Oral q morning - 10a  . piperacillin-tazobactam (ZOSYN)  IV  2.25 g Intravenous Q8H  . sodium chloride  3 mL Intravenous Q12H   Continuous: . sodium chloride 100 mL/hr at 03/25/15 2046   HYQ:MVHQIONGEXB **OR** ondansetron (ZOFRAN) IV  Assesment: She is admitted with acute encephalopathy was multifactorial. She has severe dementia which is getting worse. She is dehydrated and is being replaced. She has acute kidney injury from her dehydration. I think she probably has a UTI and her culture is pending. She has a decubitus on her sacrum Principal Problem:   Acute encephalopathy Active Problems:   Breast cancer of upper-outer quadrant of right female breast   Severe dementia   UTI (lower urinary tract infection)   Dehydration   Protein-calorie malnutrition, severe   Hypernatremia   AKI (acute kidney injury)   Decubitus ulcer of sacral region, stage 1    Plan: I think she is reaching end-stage from her dementia. I have requested palliative care consult    LOS: 1 day   , L 03/26/2015, 9:01 AM

## 2015-03-26 NOTE — Evaluation (Signed)
Physical Therapy Evaluation Patient Details Name: IVER FEHRENBACH MRN: [id number] DOB: [birth year] Today's Date: 03/26/2015   History of Present Illness  6 day history of progressive generalized weakness and little to no oral intake. Patient with baseline severe dementia. Husband cares for patient at home. Husband states that patient gets like this whenever she develops an illness. Symptoms are constant, getting worse, nothing makes her symptoms better or worse. Husband states she's taken and maybe 12 ounces of fluids over the last couple of days.the only liquid she will taken at this time are insurers.intermittent constipation and diarrhea.  Clinical Impression  Ms. Jankowiak does not appear to be at her prior functioning level unfortunately her dementia prohibits her from being a rehabilitation candidate.  Therapist suggest as soon as pt becomes more responsive that nursing staff attempts transfers and gt with walker.  Due to Ms. Kring's dementia she will not be able to retain information therefore if she is to return to an ambulatory state it will be due to repetition and not skilled care.     Follow Up Recommendations No PT follow up    Equipment Recommendations  None recommended by PT    Recommendations for Other Services   none    Precautions / Restrictions Precautions Precautions: Fall Restrictions Weight Bearing Restrictions: No         Home Living Family/patient expects to be discharged to:: Unsure Living Arrangements: Spouse/significant other Available Help at Discharge: Family;Friend(s);Available 24 hours/day Type of Home: House Home Access: Stairs to enter Entrance Stairs-Rails: Right Entrance Stairs-Number of Steps: 4 Home Layout: One level Home Equipment: Cane - single point;Walker - 2 wheels;Wheelchair - manual      Prior Function Level of Independence: Needs assistance   Gait / Transfers Assistance Needed: unknown Pt has severe dementia and no family member  is present.   ADL's / Homemaking Assistance Needed: Requires assistance        Hand Dominance   Dominant Hand: Right    Extremity/Trunk Assessment   Upper Extremity Assessment: Difficult to assess due to impaired cognition           Lower Extremity Assessment: Difficult to assess due to impaired cognition         Communication   Communication: Receptive difficulties;Expressive difficulties  Cognition Arousal/Alertness: Lethargic Behavior During Therapy: Flat affect Overall Cognitive Status: History of cognitive impairments - at baseline Area of Impairment: Orientation;Attention;Memory;Following commands;Problem solving;Awareness;Safety/judgement Orientation Level: Person;Place;Time;Situation     Following Commands:  (not following any commands or verbal cuing ) Safety/Judgement: Decreased awareness of safety;Decreased awareness of deficits   Problem Solving: Slow processing General Comments: Unable to fully assess due to progressive dementia.        Exercises General Exercises - Lower Extremity Ankle Circles/Pumps: PROM;Both;5 reps Heel Slides: PROM;Both;5 reps Hip ABduction/ADduction: PROM;Both;5 reps      Assessment/Plan    PT Assessment Patent does not need any further PT services   End of Session   Activity Tolerance: Treatment limited secondary to medical complications (Comment) Patient left: in bed;with call bell/phone within reach;with bed alarm set           Time: 1017-5102 PT Time Calculation (min) (ACUTE ONLY): 17 min   Charges:   PT Evaluation $Initial PT Evaluation Tier I: 1 Procedure     PT G CodesRayetta Humphrey, PT CLT [phone] 03/26/2015, 9:15 AM

## 2015-03-26 NOTE — Progress Notes (Addendum)
Initial Nutrition Assessment  DOCUMENTATION CODES:  Severe malnutrition in context of chronic illness  INTERVENTION:  Ensure Enlive po TID, each supplement provides 350 kcal and 20 grams of protein  Prostat Daily, each supplement provides 100 kcal and 15 g Pro  Recommend MVI with minerals  NUTRITION DIAGNOSIS:  Inadequate oral intake related to lethargy/confusion, poor appetite as evidenced by loss of 20% bw in 9 months and per patient/family report.  GOAL:  Patient will meet greater than or equal to 90% of their needs  MONITOR:  PO intake, Supplement acceptance, Labs, Skin, I & O's  REASON FOR ASSESSMENT:  Consult Assessment of nutrition requirement/status  ASSESSMENT:  74 y.o. female w/ 6 day history of progressive generalized weakness and little to no oral intake. PMhx: Hyperlipidemia, breast cancer, persistent UTI hydronephrosis, urethral polyp, severe baseline dementia, severe malnutrition.  She was recently admitted for the similar issues and discharged from [facility]  on 7/15.  Upon RD arrival no family present. Patient took little notice of my presence.   Per notes: Husband states she's taken maybe 12 ounces of fluids over the last couple of days.  Ensure is her only source of fluid. She has intermittent constipation/diarrhea and sacral decubitus ulcers.   Diet Order:  DIET DYS 3 Room service appropriate?: Yes; Fluid consistency:: Thin  Skin:  Per MD note:Sacral decubitus ulcers: stage I-II. Baseline   Last BM:  7/26  Height:  Ht Readings from Last 1 Encounters:  03/25/15 5\' 5"  (1.651 m)    Weight:  Wt Readings from Last 1 Encounters:  03/25/15 118 lb 8 oz (53.751 kg)    Wt Readings from Last 10 Encounters:  03/25/15 118 lb 8 oz (53.751 kg)  03/11/15 122 lb 1.6 oz (55.384 kg)  10/13/14 129 lb 3.2 oz (58.605 kg)  07/30/14 149 lb (67.586 kg)  07/23/14 148 lb 9.6 oz (67.405 kg)  07/02/14 150 lb 8 oz (68.266 kg)  06/13/14 156 lb (70.761 kg)  05/23/14 157 lb  (71.215 kg)  02/06/14 158 lb 9.6 oz (71.94 kg)  10/22/13 159 lb (72.122 kg)  Was admitted at 122 on 7/12. Has lost about 4 lbs since then. Significant for timeframe  Ideal Body Weight:  56.8 kg  BMI:  Body mass index is 19.72 kg/(m^2).  Estimated Nutritional Needs:  Kcal:  1600-1750 (30-33 kcals/kg) Protein:  68-80g (1.2-1.4 g/kg IBW) Fluid:  1.6-1.8 liters  EDUCATION NEEDS:  No education needs identified at this time  Burtis Junes RD, LDN Nutrition Pager: [phone] 03/26/2015 12:06 PM

## 2015-03-26 NOTE — Progress Notes (Signed)
OT Cancellation Note  Patient Details Name: ROSEALEE RECINOS MRN: [id number] DOB: August 02, 1941   Cancelled Treatment:     Reason evaluation not completed: Pt awake but unable to engage with OTR this am, due to severe dementia. No family present this am to provide information on pt care/status.   Guadelupe Sabin, OTR/L  [phone]  03/26/2015, 9:06 AM

## 2015-03-26 NOTE — Progress Notes (Signed)
ANTIBIOTIC CONSULT NOTE - INITIAL  Pharmacy Consult for Zosyn Indication: UTI, encephalopathy, sacral decubitus ulcer  Allergies  Allergen Reactions  . Vesicare [Solifenacin Succinate] Other (See Comments)    Headaches and upset stomach    Patient Measurements: Height: 5\' 5"  (165.1 cm) Weight: 118 lb 8 oz (53.751 kg) IBW/kg (Calculated) : 57  Vital Signs: Temp: 97.9 F (36.6 C) (07/27 0503) Temp Source: Axillary (07/27 0503) BP: 152/71 mmHg (07/27 0503) Pulse Rate: 103 (07/27 0503) Intake/Output from previous day: 07/26 0701 - 07/27 0700 In: 996.7 [I.V.:946.7; IV Piggyback:50] Out: 500 [Urine:500] Intake/Output from this shift:    Labs:  Recent Labs  03/25/15 1240  WBC 10.8*  HGB 9.6*  PLT 431*  CREATININE 2.94*   Estimated Creatinine Clearance: 14.3 mL/min (by C-G formula based on Cr of 2.94). No results for input(s): VANCOTROUGH, VANCOPEAK, VANCORANDOM, GENTTROUGH, GENTPEAK, GENTRANDOM, TOBRATROUGH, TOBRAPEAK, TOBRARND, AMIKACINPEAK, AMIKACINTROU, AMIKACIN in the last 72 hours.   Microbiology: No results found for this or any previous visit (from the past 720 hour(s)).  Medical History: Past Medical History  Diagnosis Date  . Cataract   . Hyperlipidemia   . History of neck surgery cervical laminectomy  . Urethral polyp removed AUG 2010  . Stress incontinence, female   . Migraines   . PONV (postoperative nausea and vomiting)   . H/O exercise stress test     done in PCP- office, maybe 15 yrs. ago   . Cancer     right breast  . Aromatase inhibitor use Nov. 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  Planned for "minimum of 5 years"  . Breast cancer 06/16/11    Right Breast   . Generalized headaches   . Hydronephrosis   . B12 deficiency    Anti-infectives    Start     Dose/Rate Route Frequency Ordered Stop   03/26/15 1400  piperacillin-tazobactam (ZOSYN) 2.25 g in  dextrose 5 % 50 mL IVPB     2.25 g 100 mL/hr over 30 Minutes Intravenous 3 times per day 03/26/15 1015     03/25/15 2300  piperacillin-tazobactam (ZOSYN) IVPB 2.25 g  Status:  Discontinued     2.25 g 100 mL/hr over 30 Minutes Intravenous Every 8 hours 03/25/15 2223 03/26/15 1015   03/25/15 1515  cefTRIAXone (ROCEPHIN) 1 g in dextrose 5 % 50 mL IVPB     1 g Intravenous  Once 03/25/15 1507 03/25/15 1619     Assessment: 74yo female admitted with failure to thrive, sacral ulcers, and suspected UTI. Asked to initiate Zosyn. SCr elevated on admission.  Goal of Therapy:  Eradicate infection.  Plan:  Zosyn 2.25gm IV q8h Monitor labs, renal fxn, and c/s  [place] Crane, Whitley Patchen A 03/26/2015,11:53 AM

## 2015-03-26 NOTE — Clinical Social Work Placement (Signed)
   CLINICAL SOCIAL WORK PLACEMENT  NOTE  Date:  03/26/2015  Patient Details  Name: Shelly Willis MRN: [id number] Date of Birth: 01-20-1941  Clinical Social Work is seeking post-discharge placement for this patient at the [place] level of care (*CSW will initial, date and re-position this form in  chart as items are completed):  Yes   Patient/family provided with [place] Work Department's list of facilities offering this level of care within the geographic area requested by the patient (or if unable, by the patient's family).  Yes   Patient/family informed of their freedom to choose among providers that offer the needed level of care, that participate in Medicare, Medicaid or managed care program needed by the patient, have an available bed and are willing to accept the patient.  Yes   Patient/family informed of [facility]'s ownership interest in [facility] and [facility], as well as of the fact that they are under no obligation to receive care at these facilities.  PASRR submitted to EDS on       PASRR number received on       Existing PASRR number confirmed on 03/26/15     FL2 transmitted to all facilities in geographic area requested by pt/family on 03/26/15     FL2 transmitted to all facilities within larger geographic area on       Patient informed that his/her managed care company has contracts with or will negotiate with certain facilities, including the following:            Patient/family informed of bed offers received.  Patient chooses bed at       Physician recommends and patient chooses bed at      Patient to be transferred to   on  .  Patient to be transferred to facility by       Patient family notified on   of transfer.  Name of family member notified:        PHYSICIAN       Additional Comment:    _______________________________________________ Ihor Gully, LCSW 03/26/2015, 12:55  PM [phone]

## 2015-03-27 DIAGNOSIS — G9340 Encephalopathy, unspecified: Secondary | ICD-10-CM

## 2015-03-27 DIAGNOSIS — Z7189 Other specified counseling: Secondary | ICD-10-CM

## 2015-03-27 DIAGNOSIS — Z515 Encounter for palliative care: Secondary | ICD-10-CM | POA: Insufficient documentation

## 2015-03-27 LAB — BASIC METABOLIC PANEL
Anion gap: 8 (ref 5–15)
BUN: 48 mg/dL — AB (ref 6–20)
CHLORIDE: 123 mmol/L — AB (ref 101–111)
CO2: 22 mmol/L (ref 22–32)
Calcium: 10.7 mg/dL — ABNORMAL HIGH (ref 8.9–10.3)
Creatinine, Ser: 1.8 mg/dL — ABNORMAL HIGH (ref 0.44–1.00)
GFR, EST AFRICAN AMERICAN: 31 mL/min — AB (ref >60)
GFR, EST NON AFRICAN AMERICAN: 27 mL/min — AB (ref >60)
GLUCOSE: 164 mg/dL — AB (ref 65–99)
Potassium: 4.1 mmol/L (ref 3.5–5.1)
SODIUM: 153 mmol/L — AB (ref 135–145)

## 2015-03-27 LAB — URINE CULTURE

## 2015-03-27 MED ORDER — CETYLPYRIDINIUM CHLORIDE 0.05 % MT LIQD
7.0000 mL | Freq: Two times a day (BID) | OROMUCOSAL | Status: DC
  Administered 2015-03-27 – 2015-03-29 (×4): 7 mL via OROMUCOSAL

## 2015-03-27 MED ORDER — PIPERACILLIN-TAZOBACTAM 3.375 G IVPB
3.3750 g | Freq: Three times a day (TID) | INTRAVENOUS | Status: DC
  Administered 2015-03-27 – 2015-03-29 (×6): 3.375 g via INTRAVENOUS
  Filled 2015-03-27 (×13): qty 50

## 2015-03-27 NOTE — Consult Note (Signed)
Consultation Note Date: 03/27/2015   Shelly Willis Name: Shelly Willis  DOB: 1940/12/27  MRN: [id number]  Age / Sex: 74 y.o., female   PCP: Sinda Du, MD Referring Physician: Sinda Du, MD  Reason for Consultation: Establishing goals of care and Psychosocial/spiritual support  Palliative Care Assessment and Plan Summary of Established Goals of Care and Medical Treatment Preferences   Clinical Assessment/Narrative: Shelly Willis appears to be elderly and frail lying in her bed today.  She has her husband and daughter in law, Shelly Willis, at her bedside.   We talk about Shelly Willis's decline over the last 6 months and how Shelly Willis has done a wonderful job taking care of her.  We talk about her visits to Neurology and a diagnosis of dementia.  We talk about her eating and drinking less, her weight loss from 160 to 120 lbs over the last 6 months, dysphagia diet, and the natural process with dementia of being unable to feed ones self; including the reduced desire to eat.  We also talk about the natural progression of dementia, and what we expect to see as she declines.  Both Shelly Willis and Shelly Willis state that they have seen this decline already.  We talk about Shelly Willis's mobility, recent falls and being unable to walk for the last month.  The chronic illness trajectory diagram is shared and both state they can see this as Shelly Willis's progression.    Mr. Stock and Shelly Willis states their goal is for Shelly Willis to stay at [facility].  They talk about her planning and preparation by getting a LTC policy.  We talk about the SW coming to discuss transfer with them and they are encouraged to work with the SW at the [facility] for continued information.  Mr. 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  Mr. Sagun is able to tell a funny story about a project they were working on where  Shelly Willis knocked on the wall in response to his knocking.     Contacts/Participants in Discussion: Primary Decision Maker: Mr. Blue is the decisions maker at this time as Shelly Willis is unable.    HCPOA: yes   Code Status/Advance Care Planning:  DNR,  We discussed the MOST form in detail and a copy will be given to family.   Mr. Fenstermaker states he WOULD NOT want a feeding tube for Shelly Willis.   We discussed ALLOW NATURAL DEATH, and the concept of do not re hospitalize, choices for IV fluids and antibiotics, and Hospice.   Symptom Management:   Zofran 4 mg PO/IV Q 6 hours PRN N/V; No pain meds ordered, and none needed at this time.   Palliative Prophylaxis: None ordered, poor po intake. Recommend Dulcolax suppository QD PRN, and Senna S 1-2 tabs BID when taking PO.   Psycho-social/Spiritual:   Support System: Mr. Plumb has been caring for his wife at home. His daughter in law is at the bedside today for our meeting.   Desire for further Chaplaincy support: Family pastor arrives as we are finishing discussion.  Prognosis: Unable to determine  Discharge Planning:  Skilled nursing facility as resident.        Chief Complaint:  AMS History of Present Illness:  6 day history of progressive generalized weakness and little to no oral intake. Shelly Willis with baseline severe dementia. Husband cares for Shelly Willis at home. Husband states  that Shelly Willis gets like this whenever she develops an illness. Symptoms are constant, getting worse, nothing makes her symptoms better or worse. Husband states she's taken and maybe 12 ounces of fluids over the last couple of days.the only liquid she will taken at this time are insurers.intermittent constipation and diarrhea. Shelly Willis also has chronic sacral decubitus ulcers to been there for months. These are not better or worse. Of note Shelly Willis admitted on 03/11/2015 for similar presentation which was felt to be due to UTI.  Primary Diagnoses    Present on Admission:  . UTI (lower urinary tract infection) . Acute encephalopathy . Dehydration . Severe dementia . Breast cancer of upper-outer quadrant of right female breast . Protein-calorie malnutrition, severe  Palliative Review of Systems: Shelly Willis denies pain, anxiety, no s/s of dyspnea.  I have reviewed the medical record, interviewed the Shelly Willis and family, and examined the Shelly Willis. The following aspects are pertinent.  Past Medical History  Diagnosis Date  . Cataract   . Hyperlipidemia   . History of neck surgery cervical laminectomy  . Urethral polyp removed AUG 2010  . Stress incontinence, female   . Migraines   . PONV (postoperative nausea and vomiting)   . H/O exercise stress test     done in PCP- office, maybe 15 yrs. ago   . Cancer     right breast  . Aromatase inhibitor use Nov. 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  Planned for "minimum of 5 years"  . Breast cancer 06/16/11    Right Breast   . Generalized headaches   . Hydronephrosis   . B12 deficiency    History   Social History  . Marital Status: Married    Spouse Name: [name]  . Number of Children: 1  . Years of Education: 12   Occupational History  . homemaker    Social History Main Topics  . Smoking status: Former Smoker -- 1.00 packs/day    Types: Cigarettes    Quit date: 07/07/1987  . Smokeless tobacco: Never Used  . Alcohol Use: No  . Drug Use: No  . Sexual Activity: No   Other Topics Concern  . None   Social History Narrative         Married to [place] for 51 years. Shelly Willis is retired. Shelly Willis has a high school education.   Right handed.   Caffeine- tea and coffee daily.   Family History  Problem Relation Age of Onset  . Cancer Neg Hx   . Stroke Mother     in mid 72's   Scheduled Meds: . antiseptic oral rinse  7 mL Mouth Rinse BID  . donepezil  10 mg Oral QHS  . feeding  supplement (ENSURE ENLIVE)  237 mL Oral BID BM  . feeding supplement (ENSURE ENLIVE)  237 mL Oral TID BM  . feeding supplement (PRO-STAT SUGAR FREE 64)  30 mL Oral Daily  . heparin  5,000 Units Subcutaneous 3 times per day  . letrozole  2.5 mg Oral Daily  . losartan  100 mg Oral q morning - 10a  . piperacillin-tazobactam (ZOSYN)  IV  3.375 g Intravenous 3 times per day  . sodium chloride  3 mL Intravenous Q12H   Continuous Infusions: . sodium chloride 100 mL/hr at 03/27/15 0915   PRN Meds:.ondansetron **OR** ondansetron (ZOFRAN) IV Medications Prior to Admission:  Prior to Admission medications   Medication Sig  Start Date End Date Taking? Authorizing Provider  acetaminophen (TYLENOL) 325 MG tablet Take 2 tablets (650 mg total) by mouth every 6 (six) hours as needed for mild pain (or Fever >/= 101). 10/16/14  Yes Sinda Du, MD  calcium-vitamin D (OSCAL WITH D) 500-200 MG-UNIT per tablet Take 1 tablet by mouth daily with breakfast.   Yes Historical Provider, MD  cyanocobalamin (,VITAMIN B-12,) 1000 MCG/ML injection Inject 1,000 mcg into the muscle every 30 (thirty) days.    Yes Historical Provider, MD  donepezil (ARICEPT) 5 MG tablet Take 2 tablets (10 mg total) by mouth at bedtime. 03/14/15  Yes Sinda Du, MD  feeding supplement, ENSURE COMPLETE, (ENSURE COMPLETE) LIQD Take 237 mLs by mouth 2 (two) times daily between meals. 10/16/14  Yes Sinda Du, MD  letrozole [facility]) 2.5 MG tablet Take 1 tablet (2.5 mg total) by mouth daily. 05/07/14  Yes Chauncey Cruel, MD  losartan (COZAAR) 100 MG tablet Take 100 mg by mouth every morning.    Yes Historical Provider, MD   Allergies  Allergen Reactions  . Vesicare [Solifenacin Succinate] Other (See Comments)    Headaches and upset stomach    CBC:    Component Value Date/Time   WBC 10.8* 03/25/2015 1240   WBC 6.7 06/05/2013 0903   HGB 9.6* 03/25/2015 1240   HGB 13.1 06/05/2013 0903   HCT 30.1* 03/25/2015 1240   HCT 39.3  06/05/2013 0903   PLT 431* 03/25/2015 1240   PLT 295 06/05/2013 0903   MCV 105.6* 03/25/2015 1240   MCV 93.6 06/05/2013 0903   NEUTROABS 8.3* 03/25/2015 1240   NEUTROABS 4.1 06/05/2013 0903   LYMPHSABS 1.6 03/25/2015 1240   LYMPHSABS 1.7 06/05/2013 0903   MONOABS 0.8 03/25/2015 1240   MONOABS 0.7 06/05/2013 0903   EOSABS 0.0 03/25/2015 1240   EOSABS 0.2 06/05/2013 0903   BASOSABS 0.1 03/25/2015 1240   BASOSABS 0.1 06/05/2013 0903   Comprehensive Metabolic Panel:    Component Value Date/Time   NA 153* 03/27/2015 1012   NA 142 06/05/2013 0903   K 4.1 03/27/2015 1012   K 3.6 06/05/2013 0903   CL 123* 03/27/2015 1012   CL 105 12/04/2012 0931   CO2 22 03/27/2015 1012   CO2 24 06/05/2013 0903   BUN 48* 03/27/2015 1012   BUN 12.5 06/05/2013 0903   CREATININE 1.80* 03/27/2015 1012   CREATININE 1.0 06/05/2013 0903   GLUCOSE 164* 03/27/2015 1012   GLUCOSE 107 06/05/2013 0903   GLUCOSE 93 12/04/2012 0931   CALCIUM 10.7* 03/27/2015 1012   CALCIUM 10.3 06/05/2013 0903   AST 56* 03/25/2015 1240   AST 26 06/05/2013 0903   ALT 51 03/25/2015 1240   ALT 25 06/05/2013 0903   ALKPHOS 166* 03/25/2015 1240   ALKPHOS 140 06/05/2013 0903   BILITOT 0.5 03/25/2015 1240   BILITOT 0.35 06/05/2013 0903   PROT 8.2* 03/25/2015 1240   PROT 7.6 06/05/2013 0903   ALBUMIN 3.6 03/25/2015 1240   ALBUMIN 3.8 06/05/2013 0903    Physical Exam: Vital Signs: BP 146/68 mmHg  Pulse 100  Temp(Src) 98 F (36.7 C) (Axillary)  Resp 18  Ht 5\' 5"  (1.651 m)  Wt 53.751 kg (118 lb 8 oz)  BMI 19.72 kg/m2  SpO2 100% SpO2: SpO2: 100 % O2 Device: O2 Device: Not Delivered O2 Flow Rate:   Intake/output summary:  Intake/Output Summary (Last 24 hours) at 03/27/15 1238 Last data filed at 03/27/15 0915  Gross per 24 hour  Intake   3000  ml  Output   1250 ml  Net   1750 ml   LBM: Last BM Date: 03/27/15 Baseline Weight: Weight: 54.885 kg (121 lb) Most recent weight: Weight: 53.751 kg (118 lb 8 oz)  Exam  Findings:  Constitutional: elderly frail lying in bed, will open her eyes, but not make eye contact.  Resp:  Even and non labored GI: abd soft, no grimace with palpation.          Palliative Performance Scale: 30%              Additional Data Reviewed: Recent Labs     03/25/15  1240  03/27/15  1012  WBC  10.8*   --   HGB  9.6*   --   PLT  431*   --   NA  146*  153*  BUN  64*  48*  CREATININE  2.94*  1.80*     Time In: 0900  Time Out: 1030 Time Total: 90 mintues Greater than 50%  of this time was spent counseling and coordinating care related to the above assessment and plan. Goals and plan of care discussed with nursing staff and Social Worker.   Signed by: Drue Novel, NP  Drue Novel, NP  03/27/2015, 12:38 PM  Please contact Palliative Medicine Team phone at [phone] for questions and concerns.

## 2015-03-27 NOTE — Clinical Social Work Note (Signed)
CSW advised Mr. Delatte that [facility] would need to be paid $5040.00 upfront prior to her admission to the facility. Mr. Brinkman advised that it would take him two days to obtain the funds.    CSW spoke to [place] at [facility] and advised that Mr. Kuna was in the process of obtaining the funds needed for admission.  Ihor Gully, [place]

## 2015-03-27 NOTE — Progress Notes (Signed)
Subjective: She continues to be poorly responsive. She has been seen by Education officer, museum and is clear that her husband is not going to be able to take care of her at home now since she is going to be admitted to a skilled care facility.  Objective: Vital signs in last 24 hours: Temp:  [97.6 F (36.4 C)-98.1 F (36.7 C)] 98 F (36.7 C) (07/28 0446) Pulse Rate:  [100-117] 100 (07/28 0446) Resp:  [18-20] 18 (07/28 0446) BP: (110-146)/(58-78) 146/68 mmHg (07/28 0446) SpO2:  [96 %-100 %] 100 % (07/28 0446) Weight change:  Last BM Date: 03/25/15  Intake/Output from previous day: 07/27 0701 - 07/28 0700 In: 2400 [P.O.:360; I.V.:2040] Out: 1250 [Urine:1250]  PHYSICAL EXAM General appearance: Awake but sluggish. She does turn but responds slowly Resp: clear to auscultation bilaterally Cardio: regular rate and rhythm, S1, S2 normal, no murmur, click, rub or gallop GI: soft, non-tender; bowel sounds normal; no masses,  no organomegaly Extremities: extremities normal, atraumatic, no cyanosis or edema  Lab Results:  Results for orders placed or performed during the hospital encounter of 03/25/15 (from the past 48 hour(s))  CBC with Differential/Platelet     Status: Abnormal   Collection Time: 03/25/15 12:40 PM  Result Value Ref Range   WBC 10.8 (H) 4.0 - 10.5 K/uL   RBC 2.85 (L) 3.87 - 5.11 MIL/uL   Hemoglobin 9.6 (L) 12.0 - 15.0 g/dL   HCT 30.1 (L) 36.0 - 46.0 %   MCV 105.6 (H) 78.0 - 100.0 fL   MCH 33.7 26.0 - 34.0 pg   MCHC 31.9 30.0 - 36.0 g/dL   RDW 18.8 (H) 11.5 - 15.5 %   Platelets 431 (H) 150 - 400 K/uL   Neutrophils Relative % 77 43 - 77 %   Lymphocytes Relative 15 12 - 46 %   Monocytes Relative 7 3 - 12 %   Eosinophils Relative 0 0 - 5 %   Basophils Relative 1 0 - 1 %   Neutro Abs 8.3 (H) 1.7 - 7.7 K/uL   Lymphs Abs 1.6 0.7 - 4.0 K/uL   Monocytes Absolute 0.8 0.1 - 1.0 K/uL   Eosinophils Absolute 0.0 0.0 - 0.7 K/uL   Basophils Absolute 0.1 0.0 - 0.1 K/uL   RBC  Morphology POLYCHROMASIA PRESENT     Comment: RARE NRBCs   WBC Morphology ATYPICAL LYMPHOCYTES   Comprehensive metabolic panel     Status: Abnormal   Collection Time: 03/25/15 12:40 PM  Result Value Ref Range   Sodium 146 (H) 135 - 145 mmol/L   Potassium 3.7 3.5 - 5.1 mmol/L   Chloride 107 101 - 111 mmol/L   CO2 28 22 - 32 mmol/L   Glucose, Bld 122 (H) 65 - 99 mg/dL   BUN 64 (H) 6 - 20 mg/dL   Creatinine, Ser 2.94 (H) 0.44 - 1.00 mg/dL   Calcium 12.8 (H) 8.9 - 10.3 mg/dL   Total Protein 8.2 (H) 6.5 - 8.1 g/dL   Albumin 3.6 3.5 - 5.0 g/dL   AST 56 (H) 15 - 41 U/L   ALT 51 14 - 54 U/L   Alkaline Phosphatase 166 (H) 38 - 126 U/L   Total Bilirubin 0.5 0.3 - 1.2 mg/dL   GFR calc non Af Amer 15 (L) >60 mL/min   GFR calc Af Amer 17 (L) >60 mL/min    Comment: (NOTE) The eGFR has been calculated using the CKD EPI equation. This calculation has not been validated in all clinical  situations. eGFR's persistently <60 mL/min signify possible Chronic Kidney Disease.    Anion gap 11 5 - 15  Urinalysis, Routine w reflex microscopic (not at [facility])     Status: Abnormal   Collection Time: 03/25/15  2:20 PM  Result Value Ref Range   Color, Urine YELLOW YELLOW   APPearance HAZY (A) CLEAR   Specific Gravity, Urine 1.025 1.005 - 1.030   pH 6.5 5.0 - 8.0   Glucose, UA NEGATIVE NEGATIVE mg/dL   Hgb urine dipstick LARGE (A) NEGATIVE   Bilirubin Urine NEGATIVE NEGATIVE   Ketones, ur NEGATIVE NEGATIVE mg/dL   Protein, ur 100 (A) NEGATIVE mg/dL   Urobilinogen, UA 0.2 0.0 - 1.0 mg/dL   Nitrite POSITIVE (A) NEGATIVE   Leukocytes, UA LARGE (A) NEGATIVE  Urine microscopic-add on     Status: Abnormal   Collection Time: 03/25/15  2:20 PM  Result Value Ref Range   Squamous Epithelial / LPF FEW (A) RARE   WBC, UA TOO NUMEROUS TO COUNT <3 WBC/hpf   RBC / HPF TOO NUMEROUS TO COUNT <3 RBC/hpf   Bacteria, UA MANY (A) RARE  Urine culture     Status: None (Preliminary result)   Collection Time: 03/25/15  2:20  PM  Result Value Ref Range   Specimen Description URINE, CATHETERIZED    Special Requests NONE    Culture      NO GROWTH < 24 HOURS Performed at [facility]    Report Status PENDING     ABGS No results for input(s): PHART, PO2ART, TCO2, HCO3 in the last 72 hours.  Invalid input(s): PCO2 CULTURES Recent Results (from the past 240 hour(s))  Urine culture     Status: None (Preliminary result)   Collection Time: 03/25/15  2:20 PM  Result Value Ref Range Status   Specimen Description URINE, CATHETERIZED  Final   Special Requests NONE  Final   Culture   Final    NO GROWTH < 24 HOURS Performed at [facility]    Report Status PENDING  Incomplete   Studies/Results: Dg Chest Portable 1 View  03/25/2015   CLINICAL DATA:  Decreased appetite and weakness. History of breast carcinoma  EXAM: PORTABLE CHEST - 1 VIEW  COMPARISON:  March 11, 2015  FINDINGS: There is scarring in the right apex, stable. There is apical pleural thickening bilaterally, stable. There is no edema or consolidation. Heart size and pulmonary vascularity are normal. No adenopathy. There is degenerative type change in the thoracic spine.  IMPRESSION: Apical scarring, more on the right than on the left. No edema or consolidation.   Electronically Signed   By: Lowella Grip III M.D.   On: 03/25/2015 13:58    Medications:  Prior to Admission:  Prescriptions prior to admission  Medication Sig Dispense Refill Last Dose  . acetaminophen (TYLENOL) 325 MG tablet Take 2 tablets (650 mg total) by mouth every 6 (six) hours as needed for mild pain (or Fever >/= 101).   unknown  . calcium-vitamin D (OSCAL WITH D) 500-200 MG-UNIT per tablet Take 1 tablet by mouth daily with breakfast.   Past Week at Unknown time  . cyanocobalamin (,VITAMIN B-12,) 1000 MCG/ML injection Inject 1,000 mcg into the muscle every 30 (thirty) days.    june 2016  . donepezil (ARICEPT) 5 MG tablet Take 2 tablets (10 mg total) by mouth at  bedtime. 30 tablet 0 Past Week at Unknown time  . feeding supplement, ENSURE COMPLETE, (ENSURE COMPLETE) LIQD Take 237 mLs  by mouth 2 (two) times daily between meals.   03/24/2015 at Unknown time  . letrozole (FEMARA) 2.5 MG tablet Take 1 tablet (2.5 mg total) by mouth daily. 60 tablet 5 Past Week at Unknown time  . losartan (COZAAR) 100 MG tablet Take 100 mg by mouth every morning.    Past Week at Unknown time   Scheduled: . antiseptic oral rinse  7 mL Mouth Rinse q12n4p  . chlorhexidine  15 mL Mouth Rinse BID  . donepezil  10 mg Oral QHS  . feeding supplement (ENSURE ENLIVE)  237 mL Oral BID BM  . feeding supplement (ENSURE ENLIVE)  237 mL Oral TID BM  . feeding supplement (PRO-STAT SUGAR FREE 64)  30 mL Oral Daily  . heparin  5,000 Units Subcutaneous 3 times per day  . letrozole  2.5 mg Oral Daily  . losartan  100 mg Oral q morning - 10a  . piperacillin-tazobactam (ZOSYN)  IV  2.25 g Intravenous 3 times per day  . sodium chloride  3 mL Intravenous Q12H   Continuous: . sodium chloride 100 mL/hr at 03/26/15 2303   OHY:WVPXTGGYIRS **OR** ondansetron (ZOFRAN) IV  Assesment: She is admitted with acute encephalopathy dehydration and acute kidney injury and severe dementia. Although there was concern that this might be a urinary tract infection thus far there is no growth. Principal Problem:   Acute encephalopathy Active Problems:   Breast cancer of upper-outer quadrant of right female breast   Severe dementia   UTI (lower urinary tract infection)   Dehydration   Protein-calorie malnutrition, severe   Hypernatremia   AKI (acute kidney injury)   Decubitus ulcer of sacral region, stage 1    Plan: Await laboratory work this morning. Continue IV fluids antibiotics etc. for the moment    LOS: 2 days   Charleston Hankin L 03/27/2015, 9:32 AM

## 2015-03-28 DIAGNOSIS — F0390 Unspecified dementia without behavioral disturbance: Secondary | ICD-10-CM

## 2015-03-28 DIAGNOSIS — E860 Dehydration: Secondary | ICD-10-CM

## 2015-03-28 DIAGNOSIS — E43 Unspecified severe protein-calorie malnutrition: Secondary | ICD-10-CM

## 2015-03-28 LAB — BASIC METABOLIC PANEL
Anion gap: 5 (ref 5–15)
BUN: 40 mg/dL — AB (ref 6–20)
CO2: 25 mmol/L (ref 22–32)
Calcium: 9.5 mg/dL (ref 8.9–10.3)
Chloride: 121 mmol/L — ABNORMAL HIGH (ref 101–111)
Creatinine, Ser: 1.72 mg/dL — ABNORMAL HIGH (ref 0.44–1.00)
GFR calc Af Amer: 33 mL/min — ABNORMAL LOW (ref >60)
GFR calc non Af Amer: 28 mL/min — ABNORMAL LOW (ref >60)
Glucose, Bld: 89 mg/dL (ref 65–99)
Potassium: 4.4 mmol/L (ref 3.5–5.1)
SODIUM: 151 mmol/L — AB (ref 135–145)

## 2015-03-28 NOTE — Progress Notes (Signed)
Subjective: She is overall about the same. She is essentially unresponsive. Her lab work is pending this morning  Objective: Vital signs in last 24 hours: Temp:  [97.9 F (36.6 C)-98.6 F (37 C)] 98.6 F (37 C) (07/29 0423) Pulse Rate:  [17-120] 115 (07/29 0423) Resp:  [18] 18 (07/29 0423) BP: (126-137)/(59-75) 137/75 mmHg (07/29 0423) SpO2:  [96 %-99 %] 99 % (07/29 0423) Weight change:  Last BM Date: 03/27/15  Intake/Output from previous day: 07/28 0701 - 07/29 0700 In: 3411.7 [P.O.:1080; I.V.:2231.7; IV Piggyback:100] Out: 1300 [Urine:1300]  PHYSICAL EXAM General appearance: no distress, pale and poorly responsive Resp: clear to auscultation bilaterally Cardio: regular rate and rhythm, S1, S2 normal, no murmur, click, rub or gallop GI: soft, non-tender; bowel sounds normal; no masses,  no organomegaly Extremities: extremities normal, atraumatic, no cyanosis or edema  Lab Results:  Results for orders placed or performed during the hospital encounter of 03/25/15 (from the past 48 hour(s))  Basic metabolic panel     Status: Abnormal   Collection Time: 03/27/15 10:12 AM  Result Value Ref Range   Sodium 153 (H) 135 - 145 mmol/L   Potassium 4.1 3.5 - 5.1 mmol/L   Chloride 123 (H) 101 - 111 mmol/L   CO2 22 22 - 32 mmol/L   Glucose, Bld 164 (H) 65 - 99 mg/dL   BUN 48 (H) 6 - 20 mg/dL   Creatinine, Ser 1.80 (H) 0.44 - 1.00 mg/dL    Comment: DELTA CHECK NOTED   Calcium 10.7 (H) 8.9 - 10.3 mg/dL   GFR calc non Af Amer 27 (L) >60 mL/min   GFR calc Af Amer 31 (L) >60 mL/min    Comment: (NOTE) The eGFR has been calculated using the CKD EPI equation. This calculation has not been validated in all clinical situations. eGFR's persistently <60 mL/min signify possible Chronic Kidney Disease.    Anion gap 8 5 - 15    ABGS No results for input(s): PHART, PO2ART, TCO2, HCO3 in the last 72 hours.  Invalid input(s): PCO2 CULTURES Recent Results (from the past 240 hour(s))  Urine  culture     Status: None   Collection Time: 03/25/15  2:20 PM  Result Value Ref Range Status   Specimen Description URINE, CATHETERIZED  Final   Special Requests NONE  Final   Culture   Final    >=100,000 COLONIES/mL AEROCOCCUS URINAE Standardized susceptibility testing for this organism is not available. Performed at [facility]    Report Status 03/27/2015 FINAL  Final   Studies/Results: No results found.  Medications:  Prior to Admission:  Prescriptions prior to admission  Medication Sig Dispense Refill Last Dose  . acetaminophen (TYLENOL) 325 MG tablet Take 2 tablets (650 mg total) by mouth every 6 (six) hours as needed for mild pain (or Fever >/= 101).   unknown  . calcium-vitamin D (OSCAL WITH D) 500-200 MG-UNIT per tablet Take 1 tablet by mouth daily with breakfast.   Past Week at Unknown time  . cyanocobalamin (,VITAMIN B-12,) 1000 MCG/ML injection Inject 1,000 mcg into the muscle every 30 (thirty) days.    june 2016  . donepezil (ARICEPT) 5 MG tablet Take 2 tablets (10 mg total) by mouth at bedtime. 30 tablet 0 Past Week at Unknown time  . feeding supplement, ENSURE COMPLETE, (ENSURE COMPLETE) LIQD Take 237 mLs by mouth 2 (two) times daily between meals.   03/24/2015 at Unknown time  . letrozole (FEMARA) 2.5 MG tablet Take 1 tablet (2.5  mg total) by mouth daily. 60 tablet 5 Past Week at Unknown time  . losartan (COZAAR) 100 MG tablet Take 100 mg by mouth every morning.    Past Week at Unknown time   Scheduled: . antiseptic oral rinse  7 mL Mouth Rinse BID  . donepezil  10 mg Oral QHS  . feeding supplement (ENSURE ENLIVE)  237 mL Oral BID BM  . feeding supplement (ENSURE ENLIVE)  237 mL Oral TID BM  . feeding supplement (PRO-STAT SUGAR FREE 64)  30 mL Oral Daily  . heparin  5,000 Units Subcutaneous 3 times per day  . letrozole  2.5 mg Oral Daily  . losartan  100 mg Oral q morning - 10a  . piperacillin-tazobactam (ZOSYN)  IV  3.375 g Intravenous 3 times per day  .  sodium chloride  3 mL Intravenous Q12H   Continuous: . sodium chloride 100 mL/hr at 03/28/15 0608   CEQ:FDVOUZHQUIQ **OR** ondansetron (ZOFRAN) IV  Assesment: She was admitted with encephalopathy probably related to urinary tract infection. She was also dehydrated and had acute kidney injury. Her lab work is pending this morning. Palliative care consult is noted and appreciated. Principal Problem:   Acute encephalopathy Active Problems:   Breast cancer of upper-outer quadrant of right female breast   Severe dementia   UTI (lower urinary tract infection)   Dehydration   Protein-calorie malnutrition, severe   Hypernatremia   AKI (acute kidney injury)   Decubitus ulcer of sacral region, stage 1   DNR (do not resuscitate) discussion   Palliative care encounter    Plan: Continue current treatments. She may need adjustment of her IV fluids depending on the results of her electrolytes this morning. She can probably go to skilled care facility over the weekend    LOS: 3 days   Destyni Hoppel L 03/28/2015, 9:00 AM

## 2015-03-28 NOTE — Progress Notes (Signed)
OT Cancellation Note  Patient Details Name: Shelly Willis MRN: [id number] DOB: 1941-05-25   Cancelled Treatment:     Reason evaluation not completed: Pt continues to be overall unresponsive, not able to engage with OTR. Pt's dementia prohibits her from retaining new information and engaging in rehabilitation services. Pt will be discharge to SNF for LTC. No further OT services are appropriate.   Guadelupe Sabin, OTR/L  [phone]  03/28/2015, 10:35 AM

## 2015-03-28 NOTE — Progress Notes (Signed)
ANTIBIOTIC CONSULT NOTE - INITIAL  Pharmacy Consult for Zosyn Indication: UTI, encephalopathy, sacral decubitus ulcer  Allergies  Allergen Reactions  . Vesicare [Solifenacin Succinate] Other (See Comments)    Headaches and upset stomach    Patient Measurements: Height: 5\' 5"  (165.1 cm) Weight: 118 lb 8 oz (53.751 kg) IBW/kg (Calculated) : 57  Vital Signs: Temp: 98.6 F (37 C) (07/29 0423) Temp Source: Axillary (07/29 0423) BP: 137/75 mmHg (07/29 0423) Pulse Rate: 115 (07/29 0423) Intake/Output from previous day: 07/28 0701 - 07/29 0700 In: 3411.7 [P.O.:1080; I.V.:2231.7; IV Piggyback:100] Out: 1300 [Urine:1300] Intake/Output from this shift:    Labs:  Recent Labs  03/25/15 1240 03/27/15 1012 03/28/15 0851  WBC 10.8*  --   --   HGB 9.6*  --   --   PLT 431*  --   --   CREATININE 2.94* 1.80* 1.72*   Estimated Creatinine Clearance: 24.4 mL/min (by C-G formula based on Cr of 1.72). No results for input(s): VANCOTROUGH, VANCOPEAK, VANCORANDOM, GENTTROUGH, GENTPEAK, GENTRANDOM, TOBRATROUGH, TOBRAPEAK, TOBRARND, AMIKACINPEAK, AMIKACINTROU, AMIKACIN in the last 72 hours.   Microbiology: Recent Results (from the past 720 hour(s))  Urine culture     Status: None   Collection Time: 03/25/15  2:20 PM  Result Value Ref Range Status   Specimen Description URINE, CATHETERIZED  Final   Special Requests NONE  Final   Culture   Final    >=100,000 COLONIES/mL AEROCOCCUS URINAE Standardized susceptibility testing for this organism is not available. Performed at [facility]    Report Status 03/27/2015 FINAL  Final    Medical History: Past Medical History  Diagnosis Date  . Cataract   . Hyperlipidemia   . History of neck surgery cervical laminectomy  . Urethral polyp removed AUG 2010  . Stress incontinence, female   . Migraines   . PONV (postoperative nausea and vomiting)   . H/O exercise stress test     done in PCP- office, maybe 15 yrs. ago   . Cancer    right breast  . Aromatase inhibitor use Nov. 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  Planned for "minimum of 5 years"  . Breast cancer 06/16/11    Right Breast   . Generalized headaches   . Hydronephrosis   . B12 deficiency    Anti-infectives    Start     Dose/Rate Route Frequency Ordered Stop   03/27/15 1400  piperacillin-tazobactam (ZOSYN) IVPB 3.375 g     3.375 g 12.5 mL/hr over 240 Minutes Intravenous 3 times per day 03/27/15 1134     03/26/15 1400  piperacillin-tazobactam (ZOSYN) 2.25 g in dextrose 5 % 50 mL IVPB  Status:  Discontinued     2.25 g 100 mL/hr over 30 Minutes Intravenous 3 times per day 03/26/15 1015 03/27/15 1134   03/25/15 2300  piperacillin-tazobactam (ZOSYN) IVPB 2.25 g  Status:  Discontinued     2.25 g 100 mL/hr over 30 Minutes Intravenous Every 8 hours 03/25/15 2223 03/26/15 1015   03/25/15 1515  cefTRIAXone (ROCEPHIN) 1 g in dextrose 5 % 50 mL IVPB     1 g Intravenous  Once 03/25/15 1507 03/25/15 1619     Assessment: 74yo female admitted with failure to thrive, sacral ulcers, and suspected UTI. Asked to initiate Zosyn. SCr elevated on admission but has gradually improved.  clcr > 20  Goal of Therapy:  Eradicate infection.  Plan:  Zosyn 3.375gm IV q8h, each dose over 4 hrs Monitor labs, renal fxn, and c/s  [place] Crane, Takiyah Bohnsack A 03/28/2015,10:42 AM

## 2015-03-28 NOTE — Care Management Important Message (Signed)
Important Message  Patient Details  Name: SHAMIAH KAHLER MRN: [id number] Date of Birth: [birth year]   Medicare Important Message Given:  Yes-second notification given    Joylene Draft, RN 03/28/2015, 11:47 AM

## 2015-03-28 NOTE — Progress Notes (Signed)
Daily Progress Note   Patient Name: Shelly Willis       Date: 03/28/2015 DOB: Sep 20, 1940  Age: 74 y.o. MRN#: [id number] Attending Physician: Sinda Du, MD Primary Care Physician: Alonza Bogus, MD Admit Date: 03/25/2015  Reason for Consultation/Follow-up: Establishing goals of care and Psychosocial/spiritual support  Subjective: Shelly Willis is sitting up in bed today.  She is able to answer simple questions.  Her Husband and sister, Webb Silversmith, are at bedside.  We talk about the MOST form and Mr. Mischke choices.  We talk about the MOST form being a fluid document that would benefit from review occasionally and with any change in condition.  We talk about encouraging fluids at SNF and Mrs. Deyoe's continued expected decline.   Length of Stay: 3 days  Current Medications: Scheduled Meds:  . antiseptic oral rinse  7 mL Mouth Rinse BID  . donepezil  10 mg Oral QHS  . feeding supplement (ENSURE ENLIVE)  237 mL Oral BID BM  . feeding supplement (ENSURE ENLIVE)  237 mL Oral TID BM  . feeding supplement (PRO-STAT SUGAR FREE 64)  30 mL Oral Daily  . heparin  5,000 Units Subcutaneous 3 times per day  . letrozole  2.5 mg Oral Daily  . losartan  100 mg Oral q morning - 10a  . piperacillin-tazobactam (ZOSYN)  IV  3.375 g Intravenous 3 times per day  . sodium chloride  3 mL Intravenous Q12H    Continuous Infusions: . sodium chloride 100 mL/hr at 03/28/15 0608    PRN Meds: ondansetron **OR** ondansetron (ZOFRAN) IV  Palliative Performance Scale: 30%     Vital Signs: BP 137/75 mmHg  Pulse 115  Temp(Src) 98.6 F (37 C) (Axillary)  Resp 18  Ht 5\' 5"  (1.651 m)  Wt 53.751 kg (118 lb 8 oz)  BMI 19.72 kg/m2  SpO2 99% SpO2: SpO2: 99 % O2 Device: O2 Device: Not Delivered O2 Flow Rate:    Intake/output summary:  Intake/Output Summary (Last 24 hours) at 03/28/15 1223 Last data filed at 03/28/15 0800  Gross per 24 hour  Intake 2811.67 ml  Output   1300 ml  Net 1511.67 ml    LBM:   Baseline Weight: Weight: 54.885 kg (121 lb) Most recent weight: Weight: 53.751 kg (118 lb 8 oz)            Additional Data Reviewed: Recent Labs     03/25/15  1240  03/27/15  1012  03/28/15  0851  WBC  10.8*   --    --   HGB  9.6*   --    --   PLT  431*   --    --   NA  146*  153*  151*  BUN  64*  48*  40*  CREATININE  2.94*  1.80*  1.72*     Problem List:  Patient Active Problem List   Diagnosis Date Noted  . DNR (do not resuscitate) discussion   . Palliative care encounter   . Hypernatremia 03/25/2015  . AKI (acute kidney injury) 03/25/2015  . Decubitus ulcer of sacral region, stage 1 03/25/2015  . Acute kidney injury 03/13/2015  . Protein-calorie malnutrition, severe 03/12/2015  . Acute encephalopathy 03/11/2015  . Hydronephrosis 03/11/2015  . Generalized weakness 03/11/2015  . Falls 03/11/2015  . FTT (failure to thrive) in adult 03/11/2015  . Dehydration 03/11/2015  . Hyperlipidemia   . UTI (urinary tract infection) 10/13/2014  . UTI (lower urinary tract infection) 10/13/2014  .  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 Severe dementia 07/23/2014  . Breast cancer of upper-outer quadrant of right female breast 06/05/2013  . Cognitive decline 03/20/2013  . Parkinsonism 03/20/2013  . 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 Right mastectomy April 2013 12/09/2011     Palliative Care Assessment & Plan    Code Status:  DNR  Goals of Care:  [name] will be transferred to [facility] today for residential care.   MOST FORM decisions  Trial of IV fluids as needed, but not indefinitely.   NO feeding tube  OK to re hospitalize at this time  Prognosis: > 6 - 12 months  Discharge Planning: Residential SNF   Care plan was discussed with Nursing staff, CM, SW, and Dr. Luan Pulling.   Thank you for allowing the Palliative Medicine Team to assist in the care of this patient.   Time In: 1100 Time Out: 1200 Total Time 60 mintues Prolonged Time Billed  no      Greater than 50%  of this time was spent counseling and coordinating care related to the above assessment and plan.   Drue Novel, NP  03/28/2015, 12:23 PM  Please contact Palliative Medicine Team phone at [phone] for questions and concerns.

## 2015-03-28 NOTE — Care Management Note (Signed)
Case Management Note  Patient Details  Name: Shelly Willis MRN: [id number] Date of Birth: [birth year]  Subjective/Objective:                    Action/Plan:   Expected Discharge Date:                  Expected Discharge Plan:  Skilled Nursing Facility  In-House Referral:  Clinical Social Work  Discharge planning Services  CM Consult  Post Acute Care Choice:  NA Choice offered to:  NA  DME Arranged:    DME Agency:     HH Arranged:    [place] Agency:     Status of Service:  Completed, signed off  Medicare Important Message Given:  Yes-second notification given Date Medicare IM Given:    Medicare IM give by:    Date Additional Medicare IM Given:    Additional Medicare Important Message give by:     If discussed at [place] of Stay Meetings, dates discussed:    Additional Comments: Anticipate discharge over the weekend to [facility]. CSW to arrange discharge to facility. Christinia Gully [place], RN 03/28/2015, 11:50 AM

## 2015-03-28 NOTE — Clinical Social Work Note (Signed)
CSW met with patient's husband who advised that he had the funds for the placement.  CSW advised Mr. Centner to take the check to [facility].  CSW contacted Keri at [facility] and advised that Mr. Lasure would be coming to the facility today with funds and to sign paperwork.  Ihor Gully, [place]. [phone]

## 2015-03-29 ENCOUNTER — Inpatient Hospital Stay
Admission: RE | Admit: 2015-03-29 | Discharge: 2015-05-01 | Disposition: E | Payer: Medicare Other | Source: Ambulatory Visit | Attending: Pulmonary Disease | Admitting: Pulmonary Disease

## 2015-03-29 LAB — BASIC METABOLIC PANEL
ANION GAP: 7 (ref 5–15)
BUN: 28 mg/dL — AB (ref 6–20)
CHLORIDE: 118 mmol/L — AB (ref 101–111)
CO2: 26 mmol/L (ref 22–32)
Calcium: 10 mg/dL (ref 8.9–10.3)
Creatinine, Ser: 1.55 mg/dL — ABNORMAL HIGH (ref 0.44–1.00)
GFR calc non Af Amer: 32 mL/min — ABNORMAL LOW (ref >60)
GFR, EST AFRICAN AMERICAN: 37 mL/min — AB (ref >60)
Glucose, Bld: 95 mg/dL (ref 65–99)
POTASSIUM: 3 mmol/L — AB (ref 3.5–5.1)
Sodium: 151 mmol/L — ABNORMAL HIGH (ref 135–145)

## 2015-03-29 MED ORDER — ENSURE ENLIVE PO LIQD: 237.0000 mL | Freq: Three times a day (TID) | ORAL | Status: AC

## 2015-03-29 MED ORDER — CIPROFLOXACIN HCL 250 MG PO TABS: 250.0000 mg | ORAL_TABLET | Freq: Two times a day (BID) | ORAL | Status: AC

## 2015-03-29 MED ORDER — PRO-STAT SUGAR FREE PO LIQD: 30.0000 mL | Freq: Every day | ORAL | Status: AC

## 2015-03-29 MED ORDER — SODIUM CHLORIDE 0.9 % IJ SOLN: 3.0000 mL | Freq: Two times a day (BID) | INTRAMUSCULAR | Status: DC

## 2015-03-29 MED ORDER — POTASSIUM CHLORIDE CRYS ER 20 MEQ PO TBCR
40.0000 meq | EXTENDED_RELEASE_TABLET | Freq: Once | ORAL | Status: AC
  Administered 2015-03-29: 40 meq via ORAL
  Filled 2015-03-29: qty 2

## 2015-03-29 NOTE — Progress Notes (Signed)
Report called to the [facility]. 

## 2015-03-29 NOTE — Discharge Summary (Signed)
Physician Discharge Summary  [name] MRN: [id number] DOB/AGE: 74-Oct-1942 74 y.o. Primary Care Physician:Josel Keo L, MD Admit date: 03/25/2015 Discharge date: 03/12/2015    Discharge Diagnoses:   Principal Problem:   Acute encephalopathy Active Problems:   Breast cancer of upper-outer quadrant of right female breast   Severe dementia   UTI (lower urinary tract infection)   Dehydration   Protein-calorie malnutrition, severe   Hypernatremia   AKI (acute kidney injury)   Decubitus ulcer of sacral region, stage 1   DNR (do not resuscitate) discussion   Palliative care encounter     Medication List    TAKE these medications        acetaminophen 325 MG tablet  Commonly known as:  TYLENOL  Take 2 tablets (650 mg total) by mouth every 6 (six) hours as needed for mild pain (or Fever >/= 101).     calcium-vitamin D 500-200 MG-UNIT per tablet  Commonly known as:  OSCAL WITH D  Take 1 tablet by mouth daily with breakfast.     ciprofloxacin 250 MG tablet  Commonly known as:  CIPRO  Take 1 tablet (250 mg total) by mouth 2 (two) times daily.     cyanocobalamin 1000 MCG/ML injection  Commonly known as:  (VITAMIN B-12)  Inject 1,000 mcg into the muscle every 30 (thirty) days.     donepezil 5 MG tablet  Commonly known as:  ARICEPT  Take 2 tablets (10 mg total) by mouth at bedtime.     feeding supplement (ENSURE COMPLETE) Liqd  Take 237 mLs by mouth 2 (two) times daily between meals.     feeding supplement (ENSURE ENLIVE) Liqd  Take 237 mLs by mouth 3 (three) times daily between meals.     feeding supplement (PRO-STAT SUGAR FREE 64) Liqd  Take 30 mLs by mouth daily.     letrozole 2.5 MG tablet  Commonly known as:  FEMARA  Take 1 tablet (2.5 mg total) by mouth daily.     losartan 100 MG tablet  Commonly known as:  COZAAR  Take 100 mg by mouth every morning.     sodium chloride 0.9 % injection  Inject 3 mLs into the vein every 12 (twelve)  hours.        Discharged Condition: Improved    Consults: Palliative care  Significant Diagnostic Studies: US Renal  03/12/2015   CLINICAL DATA:  Acute encephalopathy. History of nephrolithiasis and breast cancer. Initial encounter.  EXAM: RENAL / URINARY TRACT ULTRASOUND COMPLETE  COMPARISON:  CT 02/12/2015.  Renal ultrasound 10/16/2004)  FINDINGS: Right Kidney:  Length: 9.2 cm. Stable chronic renal cortical thinning. Mild to moderate hydronephrosis is similar to recent studies. No calculi observed.  Left Kidney:  Length: 8.8 cm. Partially obscured by bowel gas. There is stable cortical thinning without hydronephrosis or focal lesion.  Bladder:  Suboptimally evaluated due to bowel gas and incomplete distension. There is persistent irregular bladder wall thickening, similar to recent CT.  IMPRESSION: 1. Persistent mild to moderate right-sided hydronephrosis of undetermined etiology. 2. Persistent irregular bladder wall thickening as demonstrated on previous CT.   Electronically Signed   By: Richardean Sale M.D.   On: 03/12/2015 08:10   Dg Chest Portable 1 View  03/25/2015   CLINICAL DATA:  Decreased appetite and weakness. History of breast carcinoma  EXAM: PORTABLE CHEST - 1 VIEW  COMPARISON:  March 11, 2015  FINDINGS: There is scarring in the right apex, stable. There is apical pleural thickening bilaterally,  stable. There is no edema or consolidation. Heart size and pulmonary vascularity are normal. No adenopathy. There is degenerative type change in the thoracic spine.  IMPRESSION: Apical scarring, more on the right than on the left. No edema or consolidation.   Electronically Signed   By: Lowella Grip III M.D.   On: 03/25/2015 13:58   Portable Chest 1 View  03/11/2015   CLINICAL DATA:  Acute encephalopathy, altered mental status  EXAM: PORTABLE CHEST - 1 VIEW  COMPARISON:  01/04/2013  FINDINGS: Stable chronic streaky opacity in the right upper lobe compatible with scarring or post  treatment changes. Apical scarring present bilaterally. Normal heart size and vascularity. Previous right mastectomy and axillary lymph node dissection clips. Lungs otherwise clear. No new collapse, consolidation, edema, effusion, or pneumothorax. Trachea is midline. Degenerative changes of the spine.  IMPRESSION: Chronic right upper lobe scarring versus post treatment changes.  No new superimposed acute process.   Electronically Signed   By: Jerilynn Mages.  Shick M.D.   On: 03/11/2015 17:29    Lab Results: Basic Metabolic Panel:  Recent Labs  03/28/15 0851 03/06/2015 0618  NA 151* 151*  K 4.4 3.0*  CL 121* 118*  CO2 25 26  GLUCOSE 89 95  BUN 40* 28*  CREATININE 1.72* 1.55*  CALCIUM 9.5 10.0   Liver Function Tests: No results for input(s): AST, ALT, ALKPHOS, BILITOT, PROT, ALBUMIN in the last 72 hours.   CBC: No results for input(s): WBC, NEUTROABS, HGB, HCT, MCV, PLT in the last 72 hours.  Recent Results (from the past 240 hour(s))  Urine culture     Status: None   Collection Time: 03/25/15  2:20 PM  Result Value Ref Range Status   Specimen Description URINE, CATHETERIZED  Final   Special Requests NONE  Final   Culture   Final    >=100,000 COLONIES/mL AEROCOCCUS URINAE Standardized susceptibility testing for this organism is not available. Performed at [facility]    Report Status 03/27/2015 FINAL  Final     Hospital Course: This is a 74 year old who's had increasing problems with dementia. She had been in the hospital and went home but wasn't eating or drinking and has come back with dehydration and acute kidney injury and what appears to be a urinary tract infection. Her husband has been trying to take care of her at home but he says he does not think he can continue to do that. She is set up for transfer to skilled care facility. Her dementia is unchanged. Her renal function has improved.  Discharge Exam: Blood pressure 129/52, pulse 98, temperature 97.5 F (36.4 C),  temperature source Oral, resp. rate 20, height 5\' 5"  (1.651 m), weight 53.751 kg (118 lb 8 oz), SpO2 98 %. She is awake but poorly responsive. Her chest is relatively clear. Her heart is regular.  Disposition: To skilled care facility. She will be on Cipro for her urinary tract infection. She will need basic metabolic profile on August 3. She will have PT OT and speech as needed.      Discharge Instructions    Discharge to SNF when bed available    Complete by:  As directed              Signed: Edem Tiegs L   03/27/2015, 10:36 AM

## 2015-03-29 NOTE — Progress Notes (Signed)
She is overall about the same. She is awake but poorly responsive and not really interactive.  Her chest is clear. Heart is regular. Abdomen is soft.  Her sodium is still somewhat elevated at 151 potassium is now down at 3.0 BUN 28 creatinine 1.55 which is back about at her baseline.  Transfer to skilled care facility today she's going to have some potassium before she goes

## 2015-03-31 DEATH — deceased

## 2015-04-02 ENCOUNTER — Encounter (HOSPITAL_COMMUNITY)
Admission: AD | Admit: 2015-04-02 | Discharge: 2015-04-02 | Disposition: A | Discharge status: Home / Self Care | Payer: Medicare Other | Source: Skilled Nursing Facility | Attending: Pulmonary Disease | Admitting: Pulmonary Disease

## 2015-04-02 DIAGNOSIS — Z7901 Long term (current) use of anticoagulants: Secondary | ICD-10-CM | POA: Diagnosis not present

## 2015-04-02 LAB — BASIC METABOLIC PANEL
ANION GAP: 8 (ref 5–15)
BUN: 27 mg/dL — AB (ref 6–20)
CALCIUM: 11.3 mg/dL — AB (ref 8.9–10.3)
CHLORIDE: 105 mmol/L (ref 101–111)
CO2: 29 mmol/L (ref 22–32)
Creatinine, Ser: 1.45 mg/dL — ABNORMAL HIGH (ref 0.44–1.00)
GFR calc Af Amer: 40 mL/min — ABNORMAL LOW (ref >60)
GFR, EST NON AFRICAN AMERICAN: 35 mL/min — AB (ref >60)
GLUCOSE: 100 mg/dL — AB (ref 65–99)
POTASSIUM: 3.4 mmol/L — AB (ref 3.5–5.1)
SODIUM: 142 mmol/L (ref 135–145)

## 2015-04-05 NOTE — Discharge Summary (Signed)
NAMEANNELY, SLIVA           ACCOUNT NO.:  [id number]  MEDICAL RECORD NO.:  [id number]  LOCATION:  A325                          FACILITY:  [facility]  PHYSICIAN:  Madason Rauls L. Luan Pulling, M.D.DATE OF BIRTH:  05-05-1941  DATE OF ADMISSION:  03/25/2015 DATE OF DISCHARGE:  07/30/2016LH                              DISCHARGE SUMMARY   ADDENDUM:  DISCHARGE DIAGNOSES:  Metabolic encephalopathy.     Michaelann Gunnoe L. Luan Pulling, M.D.     ELH/MEDQ  D:  04/05/2015  T:  04/05/2015  Job:  [id number]

## 2015-04-05 NOTE — Discharge Summary (Deleted)
NAMEZENYA, HICKAM           ACCOUNT NO.:  [id number]  MEDICAL RECORD NO.:  [id number]  LOCATION:  A325                          FACILITY:  [facility]  PHYSICIAN:  Alegandra Sommers L. Luan Pulling, M.D.DATE OF BIRTH:  [birth year]  DATE OF ADMISSION:  03/25/2015 DATE OF DISCHARGE:  07/30/2016LH                              DISCHARGE SUMMARY   ADDENDUM:  DISCHARGE DIAGNOSES:  Metabolic encephalopathy.     Shawndell Varas L. Luan Pulling, M.D.     ELH/MEDQ  D:  04/05/2015  T:  04/05/2015  Job:  [id number]

## 2015-04-09 ENCOUNTER — Other Ambulatory Visit: Payer: Medicare Other

## 2015-04-09 ENCOUNTER — Telehealth: Payer: Self-pay | Admitting: *Deleted

## 2015-04-09 ENCOUNTER — Ambulatory Visit: Payer: Medicare Other | Admitting: Nurse Practitioner

## 2015-04-09 DIAGNOSIS — E785 Hyperlipidemia, unspecified: Secondary | ICD-10-CM | POA: Diagnosis not present

## 2015-04-09 DIAGNOSIS — E43 Unspecified severe protein-calorie malnutrition: Secondary | ICD-10-CM | POA: Diagnosis not present

## 2015-04-09 DIAGNOSIS — M6281 Muscle weakness (generalized): Secondary | ICD-10-CM | POA: Diagnosis not present

## 2015-04-09 DIAGNOSIS — E860 Dehydration: Secondary | ICD-10-CM | POA: Diagnosis not present

## 2015-04-09 DIAGNOSIS — F0390 Unspecified dementia without behavioral disturbance: Secondary | ICD-10-CM | POA: Diagnosis not present

## 2015-04-09 DIAGNOSIS — R1312 Dysphagia, oropharyngeal phase: Secondary | ICD-10-CM | POA: Diagnosis not present

## 2015-04-09 DIAGNOSIS — R627 Adult failure to thrive: Secondary | ICD-10-CM | POA: Diagnosis not present

## 2015-04-09 DIAGNOSIS — G20 Parkinson's disease: Secondary | ICD-10-CM | POA: Diagnosis not present

## 2015-04-09 NOTE — Telephone Encounter (Signed)
Called pt's husband concerning missed appointment for today, unable to reach so I call [facility] where pt is now a resident and spoke with her nurse Lattie Haw. Nurse told me pt's health has declined, dementia is worse and pt is not eating or drinking much. Nurse said it is very unlikely that pt will be able to make appts here at [facility]. Message to be fwd to Gentry Fitz, NP.

## 2015-04-10 ENCOUNTER — Telehealth: Payer: Self-pay | Admitting: *Deleted

## 2015-04-10 DIAGNOSIS — E785 Hyperlipidemia, unspecified: Secondary | ICD-10-CM | POA: Diagnosis not present

## 2015-04-10 DIAGNOSIS — R627 Adult failure to thrive: Secondary | ICD-10-CM | POA: Diagnosis not present

## 2015-04-10 DIAGNOSIS — G20 Parkinson's disease: Secondary | ICD-10-CM | POA: Diagnosis not present

## 2015-04-10 DIAGNOSIS — E860 Dehydration: Secondary | ICD-10-CM | POA: Diagnosis not present

## 2015-04-10 DIAGNOSIS — E43 Unspecified severe protein-calorie malnutrition: Secondary | ICD-10-CM | POA: Diagnosis not present

## 2015-04-10 DIAGNOSIS — R1312 Dysphagia, oropharyngeal phase: Secondary | ICD-10-CM | POA: Diagnosis not present

## 2015-04-10 DIAGNOSIS — F0390 Unspecified dementia without behavioral disturbance: Secondary | ICD-10-CM | POA: Diagnosis not present

## 2015-04-10 DIAGNOSIS — M6281 Muscle weakness (generalized): Secondary | ICD-10-CM | POA: Diagnosis not present

## 2015-04-10 NOTE — Telephone Encounter (Addendum)
Pt's husband returned my phone call from yesterday concerning wife's missed appt. Husband said pt is now a resident of [facility]. He said her dementia has worsened and she doesn't acknowledge much of anything. Husband said he  tried to call us  to cancel appt a week ago but could not get through. He also asked if wife should continue the medication Dr. Jana Hakim put her on the letrozole, I told him she is to continue that med for a total of 5 years. She has no issues taking medication for the nurses. I also told Mr. Stachnik if he needs anything to let us know. Message to be forwarded to Gentry Fitz, NP.

## 2015-04-11 DIAGNOSIS — F0390 Unspecified dementia without behavioral disturbance: Secondary | ICD-10-CM | POA: Diagnosis not present

## 2015-04-11 DIAGNOSIS — E43 Unspecified severe protein-calorie malnutrition: Secondary | ICD-10-CM | POA: Diagnosis not present

## 2015-04-11 DIAGNOSIS — E860 Dehydration: Secondary | ICD-10-CM | POA: Diagnosis not present

## 2015-04-11 DIAGNOSIS — G20 Parkinson's disease: Secondary | ICD-10-CM | POA: Diagnosis not present

## 2015-04-11 DIAGNOSIS — M6281 Muscle weakness (generalized): Secondary | ICD-10-CM | POA: Diagnosis not present

## 2015-04-11 DIAGNOSIS — R627 Adult failure to thrive: Secondary | ICD-10-CM | POA: Diagnosis not present

## 2015-04-11 DIAGNOSIS — E785 Hyperlipidemia, unspecified: Secondary | ICD-10-CM | POA: Diagnosis not present

## 2015-04-11 DIAGNOSIS — R1312 Dysphagia, oropharyngeal phase: Secondary | ICD-10-CM | POA: Diagnosis not present

## 2015-04-13 DIAGNOSIS — F0390 Unspecified dementia without behavioral disturbance: Secondary | ICD-10-CM | POA: Diagnosis not present

## 2015-04-13 DIAGNOSIS — E860 Dehydration: Secondary | ICD-10-CM | POA: Diagnosis not present

## 2015-04-13 DIAGNOSIS — N179 Acute kidney failure, unspecified: Secondary | ICD-10-CM | POA: Diagnosis not present

## 2015-04-14 DIAGNOSIS — G20 Parkinson's disease: Secondary | ICD-10-CM | POA: Diagnosis not present

## 2015-04-14 DIAGNOSIS — E785 Hyperlipidemia, unspecified: Secondary | ICD-10-CM | POA: Diagnosis not present

## 2015-04-14 DIAGNOSIS — M6281 Muscle weakness (generalized): Secondary | ICD-10-CM | POA: Diagnosis not present

## 2015-04-14 DIAGNOSIS — E43 Unspecified severe protein-calorie malnutrition: Secondary | ICD-10-CM | POA: Diagnosis not present

## 2015-04-14 DIAGNOSIS — F0390 Unspecified dementia without behavioral disturbance: Secondary | ICD-10-CM | POA: Diagnosis not present

## 2015-04-14 DIAGNOSIS — R1312 Dysphagia, oropharyngeal phase: Secondary | ICD-10-CM | POA: Diagnosis not present

## 2015-04-14 DIAGNOSIS — E860 Dehydration: Secondary | ICD-10-CM | POA: Diagnosis not present

## 2015-04-14 DIAGNOSIS — R627 Adult failure to thrive: Secondary | ICD-10-CM | POA: Diagnosis not present

## 2015-04-14 NOTE — H&P (Signed)
Shelly Willis MRN: [id number] DOB/AGE: 74/20/1942 74 y.o. Primary Care Physician:Nawal Burling L, MD Admit date: 03/11/2015 Chief Complaint: Dementia weakness HPI: This is a 74 year old who has developed increasing problems with dementia and has become unable to feed herself with failure to thrive. She was in the hospital treated for a urinary tract infection given IV fluids but it was clear that her husband was not going to be able to take care of her at home. She is in a skilled care facility.  Past Medical History  Diagnosis Date  . Cataract   . Hyperlipidemia   . History of neck surgery cervical laminectomy  . Urethral polyp removed AUG 2010  . Stress incontinence, female   . Migraines   . PONV (postoperative nausea and vomiting)   . H/O exercise stress test     done in PCP- office, maybe 15 yrs. ago   . Cancer     right breast  . Aromatase inhibitor use Nov. 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  Planned for "minimum of 5 years"  . Breast cancer 06/16/11    Right Breast   . Generalized headaches   . Hydronephrosis   . B12 deficiency    Past Surgical History  Procedure Laterality Date  . Cholecystectomy  1993  . Tubal ligation  1977  . Bladder surgery  2011    growth attached to bladder stem   . Bilateral salpingoophorectomy    . Ruptured disc surgery 1988  1988  . Kidney stones    . Modified mastectomy  12/28/2011    Procedure: MODIFIED MASTECTOMY;  Surgeon: Pedro Earls, MD;  Location: [facility];  Service: General;  Laterality: Right;  Right modified mastectomy/   . Breast surgery  12/01/2011    Right breast lumpectomy  . Needle core biopsy  11/02/11    Left Breast Needle Core Biopsy, 9'Oclocl, No Malignancy: Biospy Axilla - Mammary Carcinoma, ER/PR Positive, Low Ki67  . Needle core biopsy  06/16/11    Right Breast - Invasive Mammary with Lobular features  .  Abdominal hysterectomy  1997    complete        Family History  Problem Relation Age of Onset  . Cancer Neg Hx   . Stroke Mother     in mid 25's    Social History:  reports that she quit smoking about 27 years ago. Her smoking use included Cigarettes. She smoked 1.00 pack per day. She has never used smokeless tobacco. She reports that she does not drink alcohol or use illicit drugs.   Allergies:  Allergies  Allergen Reactions  . Vesicare [Solifenacin Succinate] Other (See Comments)    Headaches and upset stomach     Medications Prior to Admission  Medication Sig Dispense Refill  . acetaminophen (TYLENOL) 325 MG tablet Take 2 tablets (650 mg total) by mouth every 6 (six) hours as needed for mild pain (or Fever >/= 101).    . Amino Acids-Protein Hydrolys (FEEDING SUPPLEMENT, PRO-STAT SUGAR FREE 64,) LIQD Take 30 mLs by mouth daily. 900 mL 0  . calcium-vitamin D (OSCAL WITH D) 500-200 MG-UNIT per tablet Take 1 tablet by mouth daily with breakfast.    . ciprofloxacin (CIPRO) 250 MG tablet Take 1 tablet (250 mg total) by mouth 2 (two) times daily. 10 tablet 0  . cyanocobalamin (,VITAMIN B-12,) 1000 MCG/ML injection Inject 1,000 mcg into the muscle every  30 (thirty) days.     Marland Kitchen donepezil (ARICEPT) 5 MG tablet Take 2 tablets (10 mg total) by mouth at bedtime. 30 tablet 0  . feeding supplement, ENSURE COMPLETE, (ENSURE COMPLETE) LIQD Take 237 mLs by mouth 2 (two) times daily between meals.    . feeding supplement, ENSURE ENLIVE, (ENSURE ENLIVE) LIQD Take 237 mLs by mouth 3 (three) times daily between meals. 237 mL 12  . letrozole (FEMARA) 2.5 MG tablet Take 1 tablet (2.5 mg total) by mouth daily. 60 tablet 5  . losartan (COZAAR) 100 MG tablet Take 100 mg by mouth every morning.          JKQ:ASUOR from the symptoms mentioned above,there are no other symptoms referable to all systems reviewed.  Physical Exam: There were no vitals taken for this visit. She is awake but sluggish.  She does not seem to recognize me. Her chest is clear. Pupils are reactive. Nose and throat are clear. Her neck is supple. Her abdomen is soft without masses. Extremities showed no edema and her central nervous system examination shows that she is poorly responsive as mentioned confused.   No results for input(s): WBC, NEUTROABS, HGB, HCT, MCV, PLT in the last 72 hours. No results for input(s): NA, K, CL, CO2, GLUCOSE, BUN, CREATININE, CALCIUM, MG in the last 72 hours.  Invalid input(s): PHOlablast2(ast:2,ALT:2,alkphos:2,bilitot:2,prot:2,albumin:2)@    No results found for this or any previous visit (from the past 240 hour(s)).   Dg Chest Portable 1 View  03/25/2015   CLINICAL DATA:  Decreased appetite and weakness. History of breast carcinoma  EXAM: PORTABLE CHEST - 1 VIEW  COMPARISON:  March 11, 2015  FINDINGS: There is scarring in the right apex, stable. There is apical pleural thickening bilaterally, stable. There is no edema or consolidation. Heart size and pulmonary vascularity are normal. No adenopathy. There is degenerative type change in the thoracic spine.  IMPRESSION: Apical scarring, more on the right than on the left. No edema or consolidation.   Electronically Signed   By: Lowella Grip III M.D.   On: 03/25/2015 13:58   Impression: She has failure to thrive. She has end-stage dementia Active Problems:   * No active hospital problems. *     Plan: Continue current treatments. This is documentation of history and physical performed on 04/13/2015 at the skilled care facility      [place] L   04/14/2015, 8:55 AM

## 2015-04-15 DIAGNOSIS — G20 Parkinson's disease: Secondary | ICD-10-CM | POA: Diagnosis not present

## 2015-04-15 DIAGNOSIS — R627 Adult failure to thrive: Secondary | ICD-10-CM | POA: Diagnosis not present

## 2015-04-15 DIAGNOSIS — R1312 Dysphagia, oropharyngeal phase: Secondary | ICD-10-CM | POA: Diagnosis not present

## 2015-04-15 DIAGNOSIS — E43 Unspecified severe protein-calorie malnutrition: Secondary | ICD-10-CM | POA: Diagnosis not present

## 2015-04-15 DIAGNOSIS — M6281 Muscle weakness (generalized): Secondary | ICD-10-CM | POA: Diagnosis not present

## 2015-04-15 DIAGNOSIS — E785 Hyperlipidemia, unspecified: Secondary | ICD-10-CM | POA: Diagnosis not present

## 2015-04-15 DIAGNOSIS — E860 Dehydration: Secondary | ICD-10-CM | POA: Diagnosis not present

## 2015-04-15 DIAGNOSIS — F0390 Unspecified dementia without behavioral disturbance: Secondary | ICD-10-CM | POA: Diagnosis not present

## 2015-04-16 DIAGNOSIS — F0390 Unspecified dementia without behavioral disturbance: Secondary | ICD-10-CM | POA: Diagnosis not present

## 2015-04-16 DIAGNOSIS — R627 Adult failure to thrive: Secondary | ICD-10-CM | POA: Diagnosis not present

## 2015-04-16 DIAGNOSIS — M6281 Muscle weakness (generalized): Secondary | ICD-10-CM | POA: Diagnosis not present

## 2015-04-16 DIAGNOSIS — G20 Parkinson's disease: Secondary | ICD-10-CM | POA: Diagnosis not present

## 2015-04-16 DIAGNOSIS — E860 Dehydration: Secondary | ICD-10-CM | POA: Diagnosis not present

## 2015-04-16 DIAGNOSIS — E785 Hyperlipidemia, unspecified: Secondary | ICD-10-CM | POA: Diagnosis not present

## 2015-04-16 DIAGNOSIS — E43 Unspecified severe protein-calorie malnutrition: Secondary | ICD-10-CM | POA: Diagnosis not present

## 2015-04-16 DIAGNOSIS — R1312 Dysphagia, oropharyngeal phase: Secondary | ICD-10-CM | POA: Diagnosis not present

## 2015-05-01 DEATH — deceased
